# Patient Record
Sex: Female | Born: 1960 | Race: Black or African American | Hispanic: Yes | Marital: Single | Smoking: Never smoker
Health system: Southern US, Community
[De-identification: ages and names within clinical notes are randomized; demographics above are authoritative.]

## PROBLEM LIST (undated history)

## (undated) DIAGNOSIS — Z9289 Personal history of other medical treatment: Secondary | ICD-10-CM

## (undated) DIAGNOSIS — I341 Nonrheumatic mitral (valve) prolapse: Secondary | ICD-10-CM

## (undated) DIAGNOSIS — M329 Systemic lupus erythematosus, unspecified: Secondary | ICD-10-CM

## (undated) DIAGNOSIS — M545 Low back pain, unspecified: Secondary | ICD-10-CM

## (undated) DIAGNOSIS — T8859XA Other complications of anesthesia, initial encounter: Secondary | ICD-10-CM

## (undated) DIAGNOSIS — K5900 Constipation, unspecified: Secondary | ICD-10-CM

## (undated) DIAGNOSIS — E739 Lactose intolerance, unspecified: Secondary | ICD-10-CM

## (undated) DIAGNOSIS — T7840XA Allergy, unspecified, initial encounter: Secondary | ICD-10-CM

## (undated) DIAGNOSIS — K219 Gastro-esophageal reflux disease without esophagitis: Secondary | ICD-10-CM

## (undated) DIAGNOSIS — M1990 Unspecified osteoarthritis, unspecified site: Secondary | ICD-10-CM

## (undated) DIAGNOSIS — E785 Hyperlipidemia, unspecified: Secondary | ICD-10-CM

## (undated) DIAGNOSIS — C50919 Malignant neoplasm of unspecified site of unspecified female breast: Secondary | ICD-10-CM

## (undated) DIAGNOSIS — R569 Unspecified convulsions: Secondary | ICD-10-CM

## (undated) DIAGNOSIS — I10 Essential (primary) hypertension: Secondary | ICD-10-CM

## (undated) DIAGNOSIS — D573 Sickle-cell trait: Secondary | ICD-10-CM

## (undated) DIAGNOSIS — R011 Cardiac murmur, unspecified: Secondary | ICD-10-CM

## (undated) DIAGNOSIS — T4145XA Adverse effect of unspecified anesthetic, initial encounter: Secondary | ICD-10-CM

## (undated) DIAGNOSIS — G8929 Other chronic pain: Secondary | ICD-10-CM

## (undated) DIAGNOSIS — M2550 Pain in unspecified joint: Secondary | ICD-10-CM

## (undated) DIAGNOSIS — R079 Chest pain, unspecified: Secondary | ICD-10-CM

## (undated) DIAGNOSIS — E119 Type 2 diabetes mellitus without complications: Secondary | ICD-10-CM

## (undated) DIAGNOSIS — N289 Disorder of kidney and ureter, unspecified: Secondary | ICD-10-CM

## (undated) HISTORY — DX: Hyperlipidemia, unspecified: E78.5

## (undated) HISTORY — DX: Gastro-esophageal reflux disease without esophagitis: K21.9

## (undated) HISTORY — DX: Essential (primary) hypertension: I10

## (undated) HISTORY — DX: Lactose intolerance, unspecified: E73.9

## (undated) HISTORY — DX: Chest pain, unspecified: R07.9

## (undated) HISTORY — DX: Malignant neoplasm of unspecified site of unspecified female breast: C50.919

## (undated) HISTORY — DX: Pain in unspecified joint: M25.50

## (undated) HISTORY — DX: Disorder of kidney and ureter, unspecified: N28.9

## (undated) HISTORY — PX: COLONOSCOPY: SHX174

## (undated) HISTORY — DX: Systemic lupus erythematosus, unspecified: M32.9

## (undated) HISTORY — PX: POLYPECTOMY: SHX149

## (undated) HISTORY — DX: Allergy, unspecified, initial encounter: T78.40XA

---

## 1988-11-19 DIAGNOSIS — T7840XA Allergy, unspecified, initial encounter: Secondary | ICD-10-CM

## 1988-11-19 HISTORY — DX: Allergy, unspecified, initial encounter: T78.40XA

## 1989-07-20 DIAGNOSIS — Z9289 Personal history of other medical treatment: Secondary | ICD-10-CM

## 1989-07-20 HISTORY — DX: Personal history of other medical treatment: Z92.89

## 1989-11-19 DIAGNOSIS — R569 Unspecified convulsions: Secondary | ICD-10-CM

## 1989-11-19 DIAGNOSIS — K219 Gastro-esophageal reflux disease without esophagitis: Secondary | ICD-10-CM

## 1989-11-19 HISTORY — DX: Gastro-esophageal reflux disease without esophagitis: K21.9

## 1989-11-19 HISTORY — DX: Unspecified convulsions: R56.9

## 1990-11-19 DIAGNOSIS — M329 Systemic lupus erythematosus, unspecified: Secondary | ICD-10-CM

## 1990-11-19 HISTORY — DX: Systemic lupus erythematosus, unspecified: M32.9

## 1991-11-20 DIAGNOSIS — I10 Essential (primary) hypertension: Secondary | ICD-10-CM

## 1991-11-20 HISTORY — DX: Essential (primary) hypertension: I10

## 1995-11-20 HISTORY — PX: ABDOMINAL HYSTERECTOMY: SHX81

## 1998-02-17 ENCOUNTER — Ambulatory Visit (HOSPITAL_COMMUNITY): Admission: RE | Admit: 1998-02-17 | Discharge: 1998-02-17 | Payer: Self-pay | Admitting: General Surgery

## 1999-02-21 ENCOUNTER — Ambulatory Visit (HOSPITAL_COMMUNITY): Admission: RE | Admit: 1999-02-21 | Discharge: 1999-02-21 | Payer: Self-pay | Admitting: General Surgery

## 1999-02-21 ENCOUNTER — Encounter (HOSPITAL_BASED_OUTPATIENT_CLINIC_OR_DEPARTMENT_OTHER): Payer: Self-pay | Admitting: General Surgery

## 1999-06-23 ENCOUNTER — Encounter: Payer: Self-pay | Admitting: Emergency Medicine

## 1999-06-23 ENCOUNTER — Emergency Department (HOSPITAL_COMMUNITY): Admission: EM | Admit: 1999-06-23 | Discharge: 1999-06-23 | Payer: Self-pay | Admitting: Emergency Medicine

## 1999-10-02 ENCOUNTER — Ambulatory Visit (HOSPITAL_COMMUNITY): Admission: RE | Admit: 1999-10-02 | Discharge: 1999-10-02 | Payer: Self-pay | Admitting: Family Medicine

## 1999-10-02 ENCOUNTER — Encounter: Payer: Self-pay | Admitting: Family Medicine

## 2000-03-07 ENCOUNTER — Encounter: Admission: RE | Admit: 2000-03-07 | Discharge: 2000-03-07 | Payer: Self-pay | Admitting: Rheumatology

## 2000-03-07 ENCOUNTER — Encounter: Payer: Self-pay | Admitting: Rheumatology

## 2001-03-12 ENCOUNTER — Encounter: Payer: Self-pay | Admitting: Family Medicine

## 2001-03-12 ENCOUNTER — Encounter: Admission: RE | Admit: 2001-03-12 | Discharge: 2001-03-12 | Payer: Self-pay | Admitting: Family Medicine

## 2001-03-19 ENCOUNTER — Encounter: Payer: Self-pay | Admitting: Family Medicine

## 2001-03-19 ENCOUNTER — Encounter: Admission: RE | Admit: 2001-03-19 | Discharge: 2001-03-19 | Payer: Self-pay | Admitting: Internal Medicine

## 2001-05-17 ENCOUNTER — Encounter: Admission: RE | Admit: 2001-05-17 | Discharge: 2001-05-17 | Payer: Self-pay | Admitting: Family Medicine

## 2001-05-17 ENCOUNTER — Encounter: Payer: Self-pay | Admitting: Family Medicine

## 2001-07-23 ENCOUNTER — Encounter: Payer: Self-pay | Admitting: Urology

## 2001-07-23 ENCOUNTER — Encounter: Admission: RE | Admit: 2001-07-23 | Discharge: 2001-07-23 | Payer: Self-pay | Admitting: Urology

## 2001-08-01 ENCOUNTER — Encounter: Payer: Self-pay | Admitting: Urology

## 2001-08-04 ENCOUNTER — Ambulatory Visit (HOSPITAL_COMMUNITY): Admission: RE | Admit: 2001-08-04 | Discharge: 2001-08-04 | Payer: Self-pay | Admitting: Urology

## 2001-08-04 ENCOUNTER — Encounter (INDEPENDENT_AMBULATORY_CARE_PROVIDER_SITE_OTHER): Payer: Self-pay | Admitting: Specialist

## 2001-11-03 ENCOUNTER — Ambulatory Visit (HOSPITAL_COMMUNITY): Admission: RE | Admit: 2001-11-03 | Discharge: 2001-11-03 | Payer: Self-pay | Admitting: Urology

## 2001-11-03 ENCOUNTER — Encounter (INDEPENDENT_AMBULATORY_CARE_PROVIDER_SITE_OTHER): Payer: Self-pay

## 2001-11-04 ENCOUNTER — Emergency Department (HOSPITAL_COMMUNITY): Admission: EM | Admit: 2001-11-04 | Discharge: 2001-11-04 | Payer: Self-pay | Admitting: Emergency Medicine

## 2002-05-21 ENCOUNTER — Encounter: Admission: RE | Admit: 2002-05-21 | Discharge: 2002-05-21 | Payer: Self-pay | Admitting: Otolaryngology

## 2002-05-21 ENCOUNTER — Encounter: Payer: Self-pay | Admitting: Otolaryngology

## 2002-08-19 ENCOUNTER — Encounter: Payer: Self-pay | Admitting: Urology

## 2002-08-19 ENCOUNTER — Encounter: Admission: RE | Admit: 2002-08-19 | Discharge: 2002-08-19 | Payer: Self-pay | Admitting: Urology

## 2002-08-27 ENCOUNTER — Ambulatory Visit (HOSPITAL_COMMUNITY): Admission: RE | Admit: 2002-08-27 | Discharge: 2002-08-27 | Payer: Self-pay | Admitting: Obstetrics

## 2002-08-27 ENCOUNTER — Encounter: Payer: Self-pay | Admitting: Obstetrics

## 2003-09-14 ENCOUNTER — Ambulatory Visit (HOSPITAL_COMMUNITY): Admission: RE | Admit: 2003-09-14 | Discharge: 2003-09-14 | Payer: Self-pay | Admitting: Family Medicine

## 2004-10-31 ENCOUNTER — Encounter: Admission: RE | Admit: 2004-10-31 | Discharge: 2004-10-31 | Payer: Self-pay | Admitting: Family Medicine

## 2004-12-06 ENCOUNTER — Ambulatory Visit (HOSPITAL_COMMUNITY): Admission: RE | Admit: 2004-12-06 | Discharge: 2004-12-06 | Payer: Self-pay | Admitting: Family Medicine

## 2006-02-25 ENCOUNTER — Ambulatory Visit (HOSPITAL_COMMUNITY): Admission: RE | Admit: 2006-02-25 | Discharge: 2006-02-25 | Payer: Self-pay | Admitting: Family Medicine

## 2006-11-19 DIAGNOSIS — N289 Disorder of kidney and ureter, unspecified: Secondary | ICD-10-CM

## 2006-11-19 HISTORY — PX: CARPAL TUNNEL RELEASE: SHX101

## 2006-11-19 HISTORY — PX: WRIST FRACTURE SURGERY: SHX121

## 2006-11-19 HISTORY — DX: Disorder of kidney and ureter, unspecified: N28.9

## 2007-01-06 ENCOUNTER — Encounter: Admission: RE | Admit: 2007-01-06 | Discharge: 2007-01-06 | Payer: Self-pay | Admitting: Family Medicine

## 2007-01-10 ENCOUNTER — Encounter: Admission: RE | Admit: 2007-01-10 | Discharge: 2007-01-10 | Payer: Self-pay | Admitting: Family Medicine

## 2007-04-01 ENCOUNTER — Ambulatory Visit (HOSPITAL_COMMUNITY): Admission: RE | Admit: 2007-04-01 | Discharge: 2007-04-01 | Payer: Self-pay | Admitting: Family Medicine

## 2007-12-15 ENCOUNTER — Encounter: Admission: RE | Admit: 2007-12-15 | Discharge: 2007-12-15 | Payer: Self-pay | Admitting: Family Medicine

## 2008-07-08 ENCOUNTER — Ambulatory Visit (HOSPITAL_COMMUNITY): Admission: RE | Admit: 2008-07-08 | Discharge: 2008-07-08 | Payer: Self-pay | Admitting: Family Medicine

## 2008-07-15 ENCOUNTER — Encounter: Admission: RE | Admit: 2008-07-15 | Discharge: 2008-07-15 | Payer: Self-pay | Admitting: Family Medicine

## 2009-08-09 ENCOUNTER — Ambulatory Visit (HOSPITAL_COMMUNITY): Admission: RE | Admit: 2009-08-09 | Discharge: 2009-08-09 | Payer: Self-pay | Admitting: Family Medicine

## 2009-11-19 DIAGNOSIS — M1990 Unspecified osteoarthritis, unspecified site: Secondary | ICD-10-CM

## 2009-11-19 DIAGNOSIS — M2550 Pain in unspecified joint: Secondary | ICD-10-CM

## 2009-11-19 HISTORY — DX: Unspecified osteoarthritis, unspecified site: M19.90

## 2009-11-19 HISTORY — DX: Pain in unspecified joint: M25.50

## 2010-04-03 DIAGNOSIS — I10 Essential (primary) hypertension: Secondary | ICD-10-CM

## 2010-04-03 DIAGNOSIS — M329 Systemic lupus erythematosus, unspecified: Secondary | ICD-10-CM

## 2010-04-03 HISTORY — DX: Essential (primary) hypertension: I10

## 2010-04-03 HISTORY — DX: Systemic lupus erythematosus, unspecified: M32.9

## 2010-10-03 ENCOUNTER — Encounter: Admission: RE | Admit: 2010-10-03 | Discharge: 2010-10-03 | Payer: Self-pay | Admitting: Family Medicine

## 2011-02-01 ENCOUNTER — Other Ambulatory Visit (HOSPITAL_COMMUNITY): Payer: Self-pay | Admitting: Family Medicine

## 2011-02-01 DIAGNOSIS — Z1231 Encounter for screening mammogram for malignant neoplasm of breast: Secondary | ICD-10-CM

## 2011-02-13 ENCOUNTER — Ambulatory Visit (HOSPITAL_COMMUNITY)
Admission: RE | Admit: 2011-02-13 | Discharge: 2011-02-13 | Disposition: A | Discharge status: Home / Self Care | Payer: Managed Care, Other (non HMO) | Source: Ambulatory Visit | Attending: Family Medicine | Admitting: Family Medicine

## 2011-02-13 DIAGNOSIS — Z1231 Encounter for screening mammogram for malignant neoplasm of breast: Secondary | ICD-10-CM | POA: Insufficient documentation

## 2011-04-06 NOTE — Op Note (Signed)
[facility]  Patient:    Vickie, Lane Visit Number: [id number] MRN: [id number]          Service Type: DSU Location: DAY Attending Physician:  Lindaann Slough Proc. Date: 08/04/01 Admit Date:  08/04/2001   CC:         Kathreen Cosier, M.D.   Operative Report  PREOPERATIVE DIAGNOSES:  Gross hematuria, filling defects collecting system left kidney.  POSTOPERATIVE DIAGNOSES:  Gross hematuria, filling defects collecting system left kidney.  PROCEDURE DONE:  Cystoscopy, left retrograde pyelogram, ureteroscopy, brush biopsy, and biopsy of left upper calix mass.  SURGEON:  Lindaann Slough, M.D.  ANESTHESIA:  General.  INDICATION:  The patient is a 50 year old female, who has been having gross hematuria on and off for the past five months.  An IVP showed a filling defect in the upper pole calix of the left kidney.  She is scheduled today for cystoscopy, ureteroscopy, and biopsy of the filling defect.  DESCRIPTION OF PROCEDURE:  Under general anesthesia, the patient was prepped and draped and placed in the dorsal lithotomy position.  A #22 Wappler cystoscope was inserted in the bladder.  The bladder mucosa is normal.  There is no stone or tumor in the bladder.  The ureteral orifices are in normal position and shape with clear efflux.  The bladder was then irrigated with normal saline, and bladder washings were sent for cytology.  A cone tip catheter was passed through the cystoscope into the left ureteral orifice. Contrast was then injected through the cone tip catheter.  There is a filling defect in the upper pole calix of the left kidney.  The cone tip catheter was then removed.  A guidewire was passed through the cystoscope into the left ureter.  The cystoscope was removed.  A ureteroscope access sheath was then passed over the guidewire into the upper ureter.  Then a flexible ureteroscope was passed through the access sheath and  advanced into the upper ureter and to the upper pole calix of the kidney.  There is a sessile mass protruding into the lumen of the upper pole calix.  A similar mass was also visualized in a middle calix of the kidney.  A brush biopsy forceps was then passed through the ureteroscope, and brush biopsy of the upper pole calix mass was done. Several attempts were made to biopsy the mass with the cold cup forceps, but significant amount of tissue could be obtained.  Another brush biopsy of the mass was also done.  The ureteroscope was then removed.  The renal pelvis was irrigated with normal saline, and washings were sent for cytology.  Therapeutic ureteroscope access sheath was then removed.  The bladder was emptied and the cystoscope removed.  The patient tolerated the procedure well and left the OR in satisfactory condition to postanesthesia care unit. Attending Physician:  Lindaann Slough DD:  08/04/01 TD:  08/04/01 Job: [id number]

## 2011-04-06 NOTE — Op Note (Signed)
[facility]  Patient:    Vickie Lane, Vickie Lane Visit Number: [id number] MRN: [id number]          Service Type: DSU Location: DAY Attending Physician:  Lindaann Slough Dictated by:   Lindaann Slough, M.D. Proc. Date: 11/03/01 Admit Date:  11/03/2001   CC:         Kathreen Cosier, M.D.   Operative Report  PREOPERATIVE DIAGNOSES: 1. Gross hematuria. 2. Filling defect, left renal pelvis.  POSTOPERATIVE DIAGNOSES: 1. Gross hematuria. 2. Filling defect, left renal pelvis.  PROCEDURE: 1. Cystoscopy. 2. Left retrograde pyelogram. 3. Ureteroscopy with biopsy filling defects left renal pelvis. 4. Insertion of Double J catheter.  SURGEON:  Lindaann Slough, M.D.  ANESTHESIA:  General.  INDICATION:  The patient is a 50 year old female who had gross, painless hematuria about four months ago. An IVP showed a filling defect, left renal pelvis. Brush biopsy of filling defects in pathology showed atypical cells. The patient has had on and off left flank pain but no more gross hematuria. She is scheduled today for repeat biopsy of the filling defects of the renal pelvis.  DESCRIPTION OF PROCEDURE:  Under general anesthesia, the patient was prepped and draped and placed in the dorsal lithotomy position. A #22 Wappler was inserted in the bladder. The bladder mucous is normal. There is no stone or tumor in the bladder. The ureteral orifices are in normal position and shape with clear efflux. The bladder was then irrigated with normal saline and bladder washings were sent for cytology. Then a cone-tip catheter was passed through the cystoscope into the left ureteral orifice. Contrast was then injected through the cone-tip catheter. The ureter appears normal. There is no hydronephrosis or filling defect in the ureter. There is a filling defect in the upper pole calyx of the kidney. The cone-tip catheter was then removed. A guidewire was then passed through  the cystoscope into the left ureter. A ureteroscope access sheath was then passed over the guidewire and advanced up to the upper ureter. A flexible ureteroscope was then passed through the access sheath and advanced all the way up into the upper ureter. 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 A brush biopsy forceps was passed through the ureteroscope and a brush biopsy of the filling defect of the upper pole lesion was done. Then a biopsy forceps was passed on through the ureteroscope and several biopsies of the lesions were done.  The renal pelvis was then irrigated with normal saline and washings of the renal pelvis were sent for cytology. Then the guidewire was passed on through the ureteroscope access sheath and the access sheath was removed. The guidewire was then backloaded into the cystoscope and a #6 French-26 Double J catheter was passed over the guidewire. The proximal boil of the Double J catheter is in the collecting system; the distal end is in the bladder.  The patient tolerated the procedure well and left the OR in satisfactory condition to the postanesthesia care unit. Dictated by:   Lindaann Slough, M.D. Attending Physician:  Lindaann Slough DD:  11/03/01 TD:  11/03/01 Job: [id number]

## 2011-08-20 DIAGNOSIS — E119 Type 2 diabetes mellitus without complications: Secondary | ICD-10-CM

## 2011-08-20 HISTORY — DX: Type 2 diabetes mellitus without complications: E11.9

## 2011-08-27 LAB — CYTOLOGY - PAP: Pap: NEGATIVE

## 2011-09-17 ENCOUNTER — Encounter: Payer: 59 | Attending: Family Medicine | Admitting: *Deleted

## 2011-09-18 ENCOUNTER — Encounter: Payer: Self-pay | Admitting: *Deleted

## 2011-09-18 NOTE — Progress Notes (Signed)
  Patient was seen on 09/17/11 for the first of a series of three diabetes self-management courses at the [facility]. The following learning objectives were met by the patient during this course:   Defines diabetes and the role of insulin  Identifies type of diabetes and pathophysiology  States normal BG range and personal goals  Identifies three risk factors for the development of diabetes  States the need for and frequency of healthcare follow up (ADA Standards of Care)  Last A1c per Medlink: 6.6% (08/21/11)  Handouts given during class include:  [facility] Core Class 1 Handout  ADA: What's My Number (A1c handout)  [facility] ADA Standards of Care Handout  [facility] Medication Log handout  Patient has established the following initial goals:  Prioritize needs  Lose weight  Follow-Up Plan: Pt to attend Core Diabetes Education Classes II and III in December 2012.

## 2011-09-18 NOTE — Patient Instructions (Signed)
Pt to attend Core Diabetes Education Classes II and III in December 2012.

## 2011-10-30 ENCOUNTER — Encounter: Payer: 59 | Attending: Family Medicine

## 2011-11-05 NOTE — Progress Notes (Signed)
  Patient was seen on 10/30/2011 for the second of a series of three diabetes self-management courses at the [facility]. The following learning objectives were met by the patient during this course:   States the relationship of exercise to blood glucose  States benefits/barriers of regular and safe exercise  States three guidelines for safe and effective exercise  Describes personal diabetes medicine regimen  Describes actions of own medications  Describes causes, symptoms, and treatment of hypo/hyperglycemia  Describes sick day rules  Identifies when to test urine for ketones when appropriate  Identifies when to call healthcare provider for acute complications  States the risk for problems with foot, skin, and dental care  States preventative foot, skin, and dental care measures  States when to call healthcare provider regarding foot, skin, and dental care  Identifies methods for evaluation of diabetes control  Discusses benefits of SBGM  Identifies relationship between nutrition, exercise, medication, and glucose levels  Discusses the importance of record keeping  Patient received [facility] Core Program Notebook at class.  Follow-Up Plan: Patient will attend the final class of the ADA Diabetes Self-Care Education.    

## 2011-11-06 ENCOUNTER — Encounter: Payer: 59 | Admitting: Dietician

## 2011-11-07 NOTE — Progress Notes (Signed)
  Patient was seen on 11/07/2011 for the third of a series of three diabetes self-management courses at the [facility]. The following learning objectives were met by the patient during this course:   Identifies nutrient effects on glycemia  States the general guidelines of meal planning  Relates understanding of personal meal plan  Describes situations that cause stress and discuss methods of stress management  Identifies lifestyle behaviors for change  The following handouts were given in class:  [employer] Carbohydrate Counting book  3 Month Follow Up Visit handout  Goal setting handout  Class evaluation form  Your patient has established the following 3 month goals for diabetes self-care:  Eat meals on time  Check my blood sugar 3 times per week  Walk or exercise 60 minutes 4 times per week.  Check weight, blood pressure and feet every visit to doctor or educator  Follow-Up Plan: Patient will attend a 3 month follow-up visit for diabetes self-management education.

## 2012-01-24 ENCOUNTER — Ambulatory Visit: Payer: 59 | Admitting: Dietician

## 2012-02-08 ENCOUNTER — Encounter: Payer: Self-pay | Admitting: Dietician

## 2012-02-08 ENCOUNTER — Encounter: Payer: 59 | Attending: Family Medicine | Admitting: Dietician

## 2012-02-08 NOTE — Progress Notes (Signed)
  Patient was seen on 02/08/2012 for their 3 month follow-up as a part of the diabetes self-management courses at the [facility]. The following learning objectives were met by your patient during this course:  Patient self reports the following:  Diabetes control has improved since diabetes self-management training: yes Number of days blood glucose is >200: none Last MD appointment for diabetes: January Changes in treatment plan: None Confidence with ability to manage diabetes: Improved  Areas for improvement with diabetes self-care: Try to get appropriate amount of protein and continue to loose weight. Willingness to participate in diabetes support group: Limited participation R/T busy schedule and exercise regimen A1C: 08/21/11= 6.8%  11/2011= 7.7% Weight: 09/17/11= 203.3 lb   02/08/2012= 182.1 lb  (BMI:28.6)  With the weight loss and the changes in carbohydrate intake and the regular exercise.  One would anticipate a decrease in the HgA1C.  She does report having a Sickle Cell Trait.  I'm wondering if this might influence her A1C readings. A1C can have variable readings in patients with Sickle Cell.  For these folks, a Fructosamine test is often used.  This would evaluate the glucose level over a 2-3 week period as opposed the A1C 12 week period.  For her this might be a more reliable test for blood glucose average.   Please see Diabetes Flow sheet for findings related to patient's self-care.  Follow-Up Plan: Patient is eligible for a "free" 30 minute diabetes self-care appointment in the next year. Patient to call and schedule as needed.

## 2012-07-04 ENCOUNTER — Encounter (INDEPENDENT_AMBULATORY_CARE_PROVIDER_SITE_OTHER): Payer: 59 | Admitting: Ophthalmology

## 2012-08-13 ENCOUNTER — Other Ambulatory Visit: Payer: Self-pay | Admitting: Neurosurgery

## 2012-08-13 DIAGNOSIS — M5126 Other intervertebral disc displacement, lumbar region: Secondary | ICD-10-CM

## 2012-08-19 ENCOUNTER — Ambulatory Visit (HOSPITAL_COMMUNITY)
Admission: RE | Admit: 2012-08-19 | Discharge: 2012-08-19 | Disposition: A | Discharge status: Home / Self Care | Payer: 59 | Source: Ambulatory Visit | Attending: Neurosurgery | Admitting: Neurosurgery

## 2012-08-19 DIAGNOSIS — M51379 Other intervertebral disc degeneration, lumbosacral region without mention of lumbar back pain or lower extremity pain: Secondary | ICD-10-CM | POA: Insufficient documentation

## 2012-08-19 DIAGNOSIS — M5126 Other intervertebral disc displacement, lumbar region: Secondary | ICD-10-CM

## 2012-08-19 DIAGNOSIS — M5137 Other intervertebral disc degeneration, lumbosacral region: Secondary | ICD-10-CM | POA: Insufficient documentation

## 2012-10-20 ENCOUNTER — Other Ambulatory Visit: Payer: Self-pay | Admitting: Neurosurgery

## 2012-10-23 ENCOUNTER — Other Ambulatory Visit (HOSPITAL_COMMUNITY): Payer: Self-pay | Admitting: Family Medicine

## 2012-10-23 DIAGNOSIS — Z1231 Encounter for screening mammogram for malignant neoplasm of breast: Secondary | ICD-10-CM

## 2012-10-31 ENCOUNTER — Other Ambulatory Visit (HOSPITAL_COMMUNITY): Payer: 59

## 2012-10-31 ENCOUNTER — Encounter (HOSPITAL_COMMUNITY): Payer: Self-pay

## 2012-10-31 ENCOUNTER — Ambulatory Visit (HOSPITAL_COMMUNITY)
Admission: RE | Admit: 2012-10-31 | Discharge: 2012-10-31 | Disposition: A | Discharge status: Home / Self Care | Payer: 59 | Source: Ambulatory Visit | Attending: Neurosurgery | Admitting: Neurosurgery

## 2012-10-31 ENCOUNTER — Encounter (HOSPITAL_COMMUNITY)
Admission: RE | Admit: 2012-10-31 | Discharge: 2012-10-31 | Disposition: A | Discharge status: Home / Self Care | Payer: 59 | Source: Ambulatory Visit | Attending: Neurosurgery | Admitting: Neurosurgery

## 2012-10-31 DIAGNOSIS — Z01818 Encounter for other preprocedural examination: Secondary | ICD-10-CM | POA: Insufficient documentation

## 2012-10-31 HISTORY — DX: Sickle-cell trait: D57.3

## 2012-10-31 HISTORY — DX: Cardiac murmur, unspecified: R01.1

## 2012-10-31 HISTORY — DX: Constipation, unspecified: K59.00

## 2012-10-31 HISTORY — DX: Adverse effect of unspecified anesthetic, initial encounter: T41.45XA

## 2012-10-31 HISTORY — DX: Unspecified osteoarthritis, unspecified site: M19.90

## 2012-10-31 HISTORY — DX: Personal history of other medical treatment: Z92.89

## 2012-10-31 HISTORY — DX: Nonrheumatic mitral (valve) prolapse: I34.1

## 2012-10-31 HISTORY — DX: Other complications of anesthesia, initial encounter: T88.59XA

## 2012-10-31 LAB — BASIC METABOLIC PANEL
Chloride: 102 mEq/L (ref 96–112)
GFR calc Af Amer: 79 mL/min — ABNORMAL LOW (ref >90)
GFR calc non Af Amer: 68 mL/min — ABNORMAL LOW (ref >90)
Potassium: 3.2 mEq/L — ABNORMAL LOW (ref 3.5–5.1)
Sodium: 140 mEq/L (ref 135–145)

## 2012-10-31 LAB — TYPE AND SCREEN
ABO/RH(D): A POS
Antibody Screen: NEGATIVE

## 2012-10-31 LAB — CBC
HCT: 39.3 % (ref 36.0–46.0)
Hemoglobin: 13.3 g/dL (ref 12.0–15.0)
RBC: 4.8 MIL/uL (ref 3.87–5.11)
WBC: 5.4 10*3/uL (ref 4.0–10.5)

## 2012-10-31 LAB — ABO/RH: ABO/RH(D): A POS

## 2012-10-31 LAB — SURGICAL PCR SCREEN: MRSA, PCR: NEGATIVE

## 2012-10-31 NOTE — Progress Notes (Signed)
Pt does not see a cardiologist and has not had an EKG in the past year.  Pt has had one Echo, many years ago at [facility].  Never had a stress test.  Pt denies chest pain.  EKG preliminary results show "ST and T wave abnormality, consider inferior ischemia.  I left chart for Revonda Standard to review.

## 2012-10-31 NOTE — Pre-Procedure Instructions (Addendum)
[address]  10/31/2012   Your procedure is scheduled on: Friday, December 20th.  Report to [facility] at 5:30 AM.  Call this number if you have problems the morning of surgery: [phone]   Remember: Nothing to eat or drink after Midnight. .  Take these medicines the morning of surgery with A SIP OF WATER: Amlodipine (Norvasc) and Clonidine.    Stop taking Diclofenac (Voltaren) today.    Do not wear jewelry, make-up or nail polish.  Do not wear lotions, powders, or perfumes. You may wear deodorant.  Do not shave 48 hours prior to surgery. Men may shave face and neck.  Do not bring valuables to the hospital.  Contacts, dentures or bridgework may not be worn into surgery.  Leave suitcase in the car. After surgery it may be brought to your room.  For patients admitted to the hospital, checkout time is 11:00 AM the day of discharge.   Patients discharged the day of surgery will not be allowed to drive home.  Name and phone number of your driver: NA  Special Instructions: Shower using CHG 2 nights before surgery and the night before surgery.  If you shower the day of surgery use CHG.  Use special wash - you have one bottle of CHG for all showers.  You should use approximately 1/3 of the bottle for each shower.   Please read over the following fact sheets that you were given: Pain Booklet, Coughing and Deep Breathing, Blood Transfusion Information and Surgical Site Infection Prevention

## 2012-11-03 ENCOUNTER — Encounter (HOSPITAL_COMMUNITY): Payer: Self-pay | Admitting: Pharmacy Technician

## 2012-11-06 MED ORDER — CEFAZOLIN SODIUM-DEXTROSE 2-3 GM-% IV SOLR
2.0000 g | INTRAVENOUS | Status: AC
  Administered 2012-11-07: 2 g via INTRAVENOUS
  Filled 2012-11-06: qty 50

## 2012-11-07 ENCOUNTER — Encounter (HOSPITAL_COMMUNITY): Payer: Self-pay | Admitting: General Practice

## 2012-11-07 ENCOUNTER — Encounter (HOSPITAL_COMMUNITY): Payer: Self-pay | Admitting: *Deleted

## 2012-11-07 ENCOUNTER — Encounter (HOSPITAL_COMMUNITY): Payer: Self-pay | Admitting: Anesthesiology

## 2012-11-07 ENCOUNTER — Ambulatory Visit (HOSPITAL_COMMUNITY): Payer: 59 | Admitting: Anesthesiology

## 2012-11-07 ENCOUNTER — Encounter (HOSPITAL_COMMUNITY)
Admission: RE | Disposition: A | Discharge status: Home / Self Care | Payer: Self-pay | Source: Ambulatory Visit | Attending: Neurosurgery

## 2012-11-07 ENCOUNTER — Ambulatory Visit (HOSPITAL_COMMUNITY): Payer: 59

## 2012-11-07 ENCOUNTER — Inpatient Hospital Stay (HOSPITAL_COMMUNITY)
Admission: RE | Admit: 2012-11-07 | Discharge: 2012-11-10 | DRG: 460 | Disposition: A | Discharge status: Home / Self Care | Payer: 59 | Source: Ambulatory Visit | Attending: Neurosurgery | Admitting: Neurosurgery

## 2012-11-07 DIAGNOSIS — K219 Gastro-esophageal reflux disease without esophagitis: Secondary | ICD-10-CM | POA: Diagnosis present

## 2012-11-07 DIAGNOSIS — E785 Hyperlipidemia, unspecified: Secondary | ICD-10-CM | POA: Diagnosis present

## 2012-11-07 DIAGNOSIS — E119 Type 2 diabetes mellitus without complications: Secondary | ICD-10-CM | POA: Diagnosis present

## 2012-11-07 DIAGNOSIS — Z7982 Long term (current) use of aspirin: Secondary | ICD-10-CM

## 2012-11-07 DIAGNOSIS — M5126 Other intervertebral disc displacement, lumbar region: Secondary | ICD-10-CM | POA: Diagnosis present

## 2012-11-07 DIAGNOSIS — Z79899 Other long term (current) drug therapy: Secondary | ICD-10-CM

## 2012-11-07 DIAGNOSIS — Q762 Congenital spondylolisthesis: Secondary | ICD-10-CM

## 2012-11-07 DIAGNOSIS — I10 Essential (primary) hypertension: Secondary | ICD-10-CM | POA: Diagnosis present

## 2012-11-07 DIAGNOSIS — I059 Rheumatic mitral valve disease, unspecified: Secondary | ICD-10-CM | POA: Diagnosis present

## 2012-11-07 DIAGNOSIS — D573 Sickle-cell trait: Secondary | ICD-10-CM | POA: Diagnosis present

## 2012-11-07 DIAGNOSIS — M47817 Spondylosis without myelopathy or radiculopathy, lumbosacral region: Principal | ICD-10-CM | POA: Diagnosis present

## 2012-11-07 HISTORY — DX: Low back pain, unspecified: M54.50

## 2012-11-07 HISTORY — PX: POSTERIOR LUMBAR FUSION: SHX6036

## 2012-11-07 HISTORY — DX: Low back pain: M54.5

## 2012-11-07 HISTORY — DX: Unspecified convulsions: R56.9

## 2012-11-07 HISTORY — DX: Type 2 diabetes mellitus without complications: E11.9

## 2012-11-07 HISTORY — DX: Other chronic pain: G89.29

## 2012-11-07 LAB — GLUCOSE, CAPILLARY: Glucose-Capillary: 102 mg/dL — ABNORMAL HIGH (ref 70–99)

## 2012-11-07 SURGERY — POSTERIOR LUMBAR FUSION 1 LEVEL
Anesthesia: General | Wound class: Clean

## 2012-11-07 MED ORDER — MORPHINE SULFATE (PF) 1 MG/ML IV SOLN
INTRAVENOUS | Status: DC
  Administered 2012-11-07: 17:00:00 via INTRAVENOUS
  Administered 2012-11-07 – 2012-11-08 (×4): 3 mg via INTRAVENOUS
  Administered 2012-11-08: 4.5 mg via INTRAVENOUS
  Administered 2012-11-08: 3 mg via INTRAVENOUS
  Administered 2012-11-08: 2.66 mg via INTRAVENOUS
  Administered 2012-11-08: 25 mg via INTRAVENOUS
  Administered 2012-11-09: 7.5 mg via INTRAVENOUS
  Administered 2012-11-09: 9 mg via INTRAVENOUS
  Administered 2012-11-09: 4.5 mg via INTRAVENOUS
  Filled 2012-11-07 (×2): qty 25

## 2012-11-07 MED ORDER — OXYCODONE HCL 5 MG PO TABS: 5.0000 mg | ORAL_TABLET | Freq: Once | ORAL | Status: DC | PRN

## 2012-11-07 MED ORDER — HYDROCHLOROTHIAZIDE 25 MG PO TABS
25.0000 mg | ORAL_TABLET | Freq: Every day | ORAL | Status: DC
  Administered 2012-11-08 – 2012-11-10 (×3): 25 mg via ORAL
  Filled 2012-11-07 (×4): qty 1

## 2012-11-07 MED ORDER — NALOXONE HCL 0.4 MG/ML IJ SOLN: 0.4000 mg | INTRAMUSCULAR | Status: DC | PRN

## 2012-11-07 MED ORDER — POTASSIUM CHLORIDE IN NACL 20-0.9 MEQ/L-% IV SOLN
INTRAVENOUS | Status: DC
  Administered 2012-11-07 – 2012-11-08 (×2): via INTRAVENOUS
  Filled 2012-11-07 (×7): qty 1000

## 2012-11-07 MED ORDER — LACTATED RINGERS IV SOLN
INTRAVENOUS | Status: DC | PRN
  Administered 2012-11-07 (×3): via INTRAVENOUS

## 2012-11-07 MED ORDER — ONDANSETRON HCL 4 MG/2ML IJ SOLN
4.0000 mg | INTRAMUSCULAR | Status: DC | PRN
  Administered 2012-11-07 – 2012-11-10 (×9): 4 mg via INTRAVENOUS
  Filled 2012-11-07 (×7): qty 2

## 2012-11-07 MED ORDER — BISACODYL 5 MG PO TBEC
5.0000 mg | DELAYED_RELEASE_TABLET | Freq: Every day | ORAL | Status: DC | PRN
  Administered 2012-11-10: 5 mg via ORAL
  Filled 2012-11-07: qty 1

## 2012-11-07 MED ORDER — CLONIDINE HCL 0.1 MG PO TABS
0.1000 mg | ORAL_TABLET | Freq: Two times a day (BID) | ORAL | Status: DC
  Administered 2012-11-07 – 2012-11-10 (×6): 0.1 mg via ORAL
  Filled 2012-11-07 (×9): qty 1

## 2012-11-07 MED ORDER — DIPHENHYDRAMINE HCL 12.5 MG/5ML PO ELIX: 12.5000 mg | ORAL_SOLUTION | Freq: Four times a day (QID) | ORAL | Status: DC | PRN

## 2012-11-07 MED ORDER — ROCURONIUM BROMIDE 100 MG/10ML IV SOLN
INTRAVENOUS | Status: DC | PRN
  Administered 2012-11-07: 50 mg via INTRAVENOUS
  Administered 2012-11-07: 20 mg via INTRAVENOUS
  Administered 2012-11-07: 10 mg via INTRAVENOUS

## 2012-11-07 MED ORDER — SODIUM CHLORIDE 0.9 % IJ SOLN
3.0000 mL | Freq: Two times a day (BID) | INTRAMUSCULAR | Status: DC
  Administered 2012-11-07 – 2012-11-10 (×2): 3 mL via INTRAVENOUS

## 2012-11-07 MED ORDER — ESTRADIOL 0.1 MG/24HR TD PTWK
0.1000 mg | MEDICATED_PATCH | TRANSDERMAL | Status: DC
  Administered 2012-11-07: 0.1 mg via TRANSDERMAL
  Filled 2012-11-07: qty 1

## 2012-11-07 MED ORDER — ONDANSETRON HCL 4 MG/2ML IJ SOLN
4.0000 mg | Freq: Four times a day (QID) | INTRAMUSCULAR | Status: DC | PRN
  Filled 2012-11-07: qty 2

## 2012-11-07 MED ORDER — HYDROMORPHONE HCL PF 1 MG/ML IJ SOLN
0.2500 mg | INTRAMUSCULAR | Status: DC | PRN
  Administered 2012-11-07 (×2): 0.5 mg via INTRAVENOUS

## 2012-11-07 MED ORDER — ACETAMINOPHEN 10 MG/ML IV SOLN
INTRAVENOUS | Status: AC
  Administered 2012-11-07: 1000 mg via INTRAVENOUS
  Filled 2012-11-07: qty 100

## 2012-11-07 MED ORDER — SODIUM CHLORIDE 0.9 % IJ SOLN: 9.0000 mL | INTRAMUSCULAR | Status: DC | PRN

## 2012-11-07 MED ORDER — ONDANSETRON HCL 4 MG/2ML IJ SOLN
INTRAMUSCULAR | Status: DC | PRN
  Administered 2012-11-07: 4 mg via INTRAVENOUS

## 2012-11-07 MED ORDER — PROPOFOL 10 MG/ML IV BOLUS
INTRAVENOUS | Status: DC | PRN
  Administered 2012-11-07: 180 mg via INTRAVENOUS

## 2012-11-07 MED ORDER — LIDOCAINE HCL (CARDIAC) 20 MG/ML IV SOLN
INTRAVENOUS | Status: DC | PRN
  Administered 2012-11-07: 100 mg via INTRAVENOUS

## 2012-11-07 MED ORDER — DIPHENHYDRAMINE HCL 50 MG/ML IJ SOLN: 12.5000 mg | Freq: Four times a day (QID) | INTRAMUSCULAR | Status: DC | PRN

## 2012-11-07 MED ORDER — 0.9 % SODIUM CHLORIDE (POUR BTL) OPTIME
TOPICAL | Status: DC | PRN
  Administered 2012-11-07: 1000 mL

## 2012-11-07 MED ORDER — DIAZEPAM 5 MG PO TABS
5.0000 mg | ORAL_TABLET | Freq: Four times a day (QID) | ORAL | Status: DC | PRN
  Administered 2012-11-08 – 2012-11-09 (×2): 5 mg via ORAL
  Filled 2012-11-07 (×2): qty 1

## 2012-11-07 MED ORDER — ACETAMINOPHEN 650 MG RE SUPP: 650.0000 mg | RECTAL | Status: DC | PRN

## 2012-11-07 MED ORDER — ASPIRIN EC 81 MG PO TBEC
81.0000 mg | DELAYED_RELEASE_TABLET | Freq: Every morning | ORAL | Status: DC
  Administered 2012-11-08 – 2012-11-10 (×3): 81 mg via ORAL
  Filled 2012-11-07 (×3): qty 1

## 2012-11-07 MED ORDER — SODIUM CHLORIDE 0.9 % IJ SOLN: 3.0000 mL | INTRAMUSCULAR | Status: DC | PRN

## 2012-11-07 MED ORDER — NEOSTIGMINE METHYLSULFATE 1 MG/ML IJ SOLN
INTRAMUSCULAR | Status: DC | PRN
  Administered 2012-11-07: 3 mg via INTRAVENOUS

## 2012-11-07 MED ORDER — SODIUM CHLORIDE 0.9 % IV SOLN
250.0000 mL | INTRAVENOUS | Status: DC
  Administered 2012-11-07: 250 mL via INTRAVENOUS

## 2012-11-07 MED ORDER — BENAZEPRIL HCL 20 MG PO TABS
20.0000 mg | ORAL_TABLET | Freq: Every day | ORAL | Status: DC
  Administered 2012-11-08 – 2012-11-10 (×3): 20 mg via ORAL
  Filled 2012-11-07 (×4): qty 1

## 2012-11-07 MED ORDER — MIDAZOLAM HCL 5 MG/5ML IJ SOLN
INTRAMUSCULAR | Status: DC | PRN
  Administered 2012-11-07: 2 mg via INTRAVENOUS

## 2012-11-07 MED ORDER — OXYCODONE HCL 5 MG/5ML PO SOLN: 5.0000 mg | Freq: Once | ORAL | Status: DC | PRN

## 2012-11-07 MED ORDER — PHENOL 1.4 % MT LIQD: 1.0000 | OROMUCOSAL | Status: DC | PRN

## 2012-11-07 MED ORDER — AMLODIPINE BESYLATE 10 MG PO TABS
10.0000 mg | ORAL_TABLET | Freq: Every day | ORAL | Status: DC
  Administered 2012-11-07 – 2012-11-09 (×3): 10 mg via ORAL
  Filled 2012-11-07 (×4): qty 1

## 2012-11-07 MED ORDER — HYDROMORPHONE HCL PF 1 MG/ML IJ SOLN
INTRAMUSCULAR | Status: AC
  Filled 2012-11-07: qty 1

## 2012-11-07 MED ORDER — PROMETHAZINE HCL 25 MG/ML IJ SOLN: 6.2500 mg | INTRAMUSCULAR | Status: DC | PRN

## 2012-11-07 MED ORDER — CEFAZOLIN SODIUM 1-5 GM-% IV SOLN
1.0000 g | Freq: Three times a day (TID) | INTRAVENOUS | Status: AC
  Administered 2012-11-07: 1 g via INTRAVENOUS
  Filled 2012-11-07 (×2): qty 50

## 2012-11-07 MED ORDER — PHENYLEPHRINE HCL 10 MG/ML IJ SOLN
INTRAMUSCULAR | Status: DC | PRN
  Administered 2012-11-07: 40 ug via INTRAVENOUS

## 2012-11-07 MED ORDER — VALACYCLOVIR HCL 500 MG PO TABS
500.0000 mg | ORAL_TABLET | Freq: Every day | ORAL | Status: DC
  Administered 2012-11-08 – 2012-11-10 (×3): 500 mg via ORAL
  Filled 2012-11-07 (×4): qty 1

## 2012-11-07 MED ORDER — ACETAMINOPHEN 325 MG PO TABS: 650.0000 mg | ORAL_TABLET | ORAL | Status: DC | PRN

## 2012-11-07 MED ORDER — HEMOSTATIC AGENTS (NO CHARGE) OPTIME
TOPICAL | Status: DC | PRN
  Administered 2012-11-07: 1 via TOPICAL

## 2012-11-07 MED ORDER — MENTHOL 3 MG MT LOZG
1.0000 | LOZENGE | OROMUCOSAL | Status: DC | PRN
Start: 2012-11-07 — End: 2012-11-10

## 2012-11-07 MED ORDER — DEXAMETHASONE SODIUM PHOSPHATE 4 MG/ML IJ SOLN
INTRAMUSCULAR | Status: DC | PRN
  Administered 2012-11-07: 10 mg via INTRAVENOUS

## 2012-11-07 MED ORDER — SENNA 8.6 MG PO TABS
1.0000 | ORAL_TABLET | Freq: Two times a day (BID) | ORAL | Status: DC
  Administered 2012-11-07 – 2012-11-10 (×6): 8.6 mg via ORAL
  Filled 2012-11-07 (×8): qty 1

## 2012-11-07 MED ORDER — ARTIFICIAL TEARS OP OINT
TOPICAL_OINTMENT | OPHTHALMIC | Status: DC | PRN
  Administered 2012-11-07: 1 via OPHTHALMIC

## 2012-11-07 MED ORDER — GLYCOPYRROLATE 0.2 MG/ML IJ SOLN
INTRAMUSCULAR | Status: DC | PRN
  Administered 2012-11-07: 0.4 mg via INTRAVENOUS

## 2012-11-07 MED ORDER — THROMBIN 20000 UNITS EX SOLR
CUTANEOUS | Status: DC | PRN
  Administered 2012-11-07: 08:00:00 via TOPICAL

## 2012-11-07 MED ORDER — MAGNESIUM CITRATE PO SOLN
1.0000 | Freq: Once | ORAL | Status: AC | PRN
  Filled 2012-11-07: qty 296

## 2012-11-07 MED ORDER — BENAZEPRIL-HYDROCHLOROTHIAZIDE 20-25 MG PO TABS: 1.0000 | ORAL_TABLET | Freq: Every morning | ORAL | Status: DC

## 2012-11-07 MED ORDER — LIDOCAINE-EPINEPHRINE 0.5 %-1:200000 IJ SOLN
INTRAMUSCULAR | Status: DC | PRN
  Administered 2012-11-07: 10 mL

## 2012-11-07 MED ORDER — FENTANYL CITRATE 0.05 MG/ML IJ SOLN
INTRAMUSCULAR | Status: DC | PRN
  Administered 2012-11-07: 50 ug via INTRAVENOUS
  Administered 2012-11-07: 100 ug via INTRAVENOUS
  Administered 2012-11-07: 50 ug via INTRAVENOUS
  Administered 2012-11-07 (×4): 100 ug via INTRAVENOUS

## 2012-11-07 MED ORDER — POLYETHYLENE GLYCOL 3350 17 G PO PACK
17.0000 g | PACK | Freq: Every day | ORAL | Status: DC | PRN
  Filled 2012-11-07: qty 1

## 2012-11-07 SURGICAL SUPPLY — 69 items
BENZOIN TINCTURE PRP APPL 2/3 (GAUZE/BANDAGES/DRESSINGS) IMPLANT
BLADE SURG ROTATE 9660 (MISCELLANEOUS) IMPLANT
BONE MATRIX OSTEOCEL PLUS 5CC (Bone Implant) ×2 IMPLANT
BUR MATCHSTICK NEURO 3.0 LAGG (BURR) ×2 IMPLANT
CAGE COROENT 10X9X23 (Cage) ×4 IMPLANT
CANISTER SUCTION 2500CC (MISCELLANEOUS) ×2 IMPLANT
CLOTH BEACON ORANGE TIMEOUT ST (SAFETY) ×2 IMPLANT
CONT SPEC 4OZ CLIKSEAL STRL BL (MISCELLANEOUS) ×2 IMPLANT
COVER BACK TABLE 24X17X13 BIG (DRAPES) IMPLANT
DECANTER SPIKE VIAL GLASS SM (MISCELLANEOUS) ×2 IMPLANT
DERMABOND ADVANCED (GAUZE/BANDAGES/DRESSINGS) ×1
DERMABOND ADVANCED .7 DNX12 (GAUZE/BANDAGES/DRESSINGS) ×1 IMPLANT
DRAPE C-ARM 42X72 X-RAY (DRAPES) ×4 IMPLANT
DRAPE C-ARMOR (DRAPES) ×2 IMPLANT
DRAPE LAPAROTOMY 100X72X124 (DRAPES) ×2 IMPLANT
DRAPE POUCH INSTRU U-SHP 10X18 (DRAPES) ×2 IMPLANT
DRAPE SURG 17X23 STRL (DRAPES) ×2 IMPLANT
DRESSING TELFA 8X3 (GAUZE/BANDAGES/DRESSINGS) IMPLANT
DURAPREP 26ML APPLICATOR (WOUND CARE) ×2 IMPLANT
ELECT REM PT RETURN 9FT ADLT (ELECTROSURGICAL) ×2
ELECTRODE REM PT RTRN 9FT ADLT (ELECTROSURGICAL) ×1 IMPLANT
GAUZE SPONGE 4X4 16PLY XRAY LF (GAUZE/BANDAGES/DRESSINGS) IMPLANT
GLOVE BIO SURGEON STRL SZ8 (GLOVE) ×2 IMPLANT
GLOVE BIOGEL PI IND STRL 7.5 (GLOVE) ×1 IMPLANT
GLOVE BIOGEL PI IND STRL 8 (GLOVE) ×4 IMPLANT
GLOVE BIOGEL PI INDICATOR 7.5 (GLOVE) ×1
GLOVE BIOGEL PI INDICATOR 8 (GLOVE) ×4
GLOVE ECLIPSE 6.5 STRL STRAW (GLOVE) ×4 IMPLANT
GLOVE ECLIPSE 7.5 STRL STRAW (GLOVE) ×2 IMPLANT
GLOVE EXAM NITRILE LRG STRL (GLOVE) ×2 IMPLANT
GLOVE EXAM NITRILE MD LF STRL (GLOVE) IMPLANT
GLOVE EXAM NITRILE XL STR (GLOVE) ×2 IMPLANT
GLOVE EXAM NITRILE XS STR PU (GLOVE) IMPLANT
GLOVE INDICATOR 8.5 STRL (GLOVE) ×6 IMPLANT
GOWN BRE IMP SLV AUR LG STRL (GOWN DISPOSABLE) ×4 IMPLANT
GOWN BRE IMP SLV AUR XL STRL (GOWN DISPOSABLE) ×2 IMPLANT
GOWN STRL REIN 2XL LVL4 (GOWN DISPOSABLE) ×4 IMPLANT
KIT BASIN OR (CUSTOM PROCEDURE TRAY) ×2 IMPLANT
KIT POSITION SURG JACKSON T1 (MISCELLANEOUS) ×2 IMPLANT
KIT ROOM TURNOVER OR (KITS) ×2 IMPLANT
LIGHT SOURCE ANGLE TIP STR 7FT (MISCELLANEOUS) ×2 IMPLANT
NEEDLE HYPO 25X1 1.5 SAFETY (NEEDLE) ×2 IMPLANT
NEEDLE SPNL 18GX3.5 QUINCKE PK (NEEDLE) IMPLANT
NS IRRIG 1000ML POUR BTL (IV SOLUTION) ×2 IMPLANT
PACK LAMINECTOMY NEURO (CUSTOM PROCEDURE TRAY) ×2 IMPLANT
PAD ARMBOARD 7.5X6 YLW CONV (MISCELLANEOUS) ×4 IMPLANT
ROD 30MM (Rod) ×1 IMPLANT
ROD 35MM (Rod) ×2 IMPLANT
ROD PLIF MAS PB SPHERX 30 (Rod) ×1 IMPLANT
SCREW 5.0X30 (Screw) ×2 IMPLANT
SCREW LOCK (Screw) ×4 IMPLANT
SCREW LOCK FXNS SPNE MAS PL (Screw) ×4 IMPLANT
SCREW SHANK 5.5X40MM (Screw) ×4 IMPLANT
SCREW SHANK 6.5X30 (Screw) ×2 IMPLANT
SCREW TULIP 5.5 (Screw) ×6 IMPLANT
SPONGE GAUZE 4X4 12PLY (GAUZE/BANDAGES/DRESSINGS) IMPLANT
SPONGE LAP 4X18 X RAY DECT (DISPOSABLE) IMPLANT
SPONGE SURGIFOAM ABS GEL 100 (HEMOSTASIS) ×2 IMPLANT
STRIP CLOSURE SKIN 1/2X4 (GAUZE/BANDAGES/DRESSINGS) IMPLANT
SUT PROLENE 6 0 BV (SUTURE) IMPLANT
SUT VIC AB 0 CT1 18XCR BRD8 (SUTURE) ×1 IMPLANT
SUT VIC AB 0 CT1 8-18 (SUTURE) ×1
SUT VIC AB 2-0 CT1 18 (SUTURE) ×2 IMPLANT
SUT VIC AB 3-0 SH 8-18 (SUTURE) ×2 IMPLANT
SYR 20ML ECCENTRIC (SYRINGE) ×2 IMPLANT
TOWEL OR 17X24 6PK STRL BLUE (TOWEL DISPOSABLE) ×2 IMPLANT
TOWEL OR 17X26 10 PK STRL BLUE (TOWEL DISPOSABLE) ×2 IMPLANT
TRAY FOLEY CATH 14FRSI W/METER (CATHETERS) ×2 IMPLANT
WATER STERILE IRR 1000ML POUR (IV SOLUTION) ×2 IMPLANT

## 2012-11-07 NOTE — Op Note (Signed)
11/07/2012  7:24 PM  PATIENT:  Vickie Lane  51 y.o. 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  PROCEDURE:  Procedure(s): POSTERIOR LUMBAR FUSION 1 LEVEL L4/5 Posterior lumbar interbody arthrodesis L4/5 with Nuvasive Peek 10mm interbodies with morselized  Local autograft Posterolateral arthrodesis, morselized allograft Non segmental pedicle screw fixation MAS PLIf instrumentation nuvasive L4,5  SURGEON:  Surgeon(s): Carmela Hurt, MD Mariam Dollar, MD  ASSISTANTS:Cram, Henreitta Cea  ANESTHESIA:   general  EBL:     BLOOD ADMINISTERED:none  CELL SAVER GIVEN:none  COUNT:per nursing  DRAINS: none   SPECIMEN:  No Specimen  DICTATION: Vickie Lane was taken to the operating room intubated and placed under a general anesthetic without difficulty. A foley catheter was placed under sterile conditions. She was positioned prone on a Jackson table with all pressure points padded. Her back was prepped and draped in a sterile manner with duraprep. I infiltrated 1/2% lidocaine into the planned incision in the lumbar region. I opened the skin with a 10 blade and used the monopolar cautery to expose the L4 and L5 lamina bilaterally. I dissected laterally to expose the pars interarticularis of L4 and L5 bilaterally. I placed self retaining retractors and proceeded with pedicle screw placement. With fluoroscopic guidance Dr. Wynetta Emery and I were able to place the screws in the medial to lateral trajectory at L4. However at L5 used to simply did not have a good enough view using fluoroscopy using a at that point I decided to proceed with the decompression and posterior lumbar interbody arthrodesis.  I used the drill Kerrison punches and curettes to decompress the  spinal canal between the L4 and L5 lamina. I exposed the disc spaces I lateral and performed discectomies bilaterally at L4-5. Dr. Wynetta Emery and I will able to achieve a very good decompression of the thecal sac L4 and L5 nerve roots in the bilateral manner. I used curettes disc space shavers and a rasp to appear the endplates and disc space for the peek interbodies. After measuring the disc space I felt the 10 mm I believe 22 mm peek cages would work best. These were filled with morcellized autograft. Dr. Wynetta Emery and I placed these without difficulty into the disc space on both right and left sides at L4-5. This was done with fluoroscopic guidance. Having now expose the pedicles of L5 bilaterally I was able to place screws without problem I use a traditional trajectory on the right side at L5 and a medial to lateral trajectory on the left side at L5. F I decorticated the lateral bone near the facet and the pars interarticularis of L4 and L5. I placed morselized allograft over these areas to complete the posterolateral arthrodesis. luoroscopy showed all the screws to be in good position. I then connected the crews together by placing a rod in the screw heads and using a In secure position. I locked this into position with a torque limited screwdriver. I irrigated the wound. I closed the wound in layered fashion using Vicryl sutures to reapproximate the thoracolumbar subcutaneous and subcuticular layers. I used Dermabond for a sterile dressing. The patient was rolled supine onto the stretcher and extubated without difficulty.  PLAN OF CARE: Admit to inpatient   PATIENT DISPOSITION:  PACU - hemodynamically stable.   Delay start of Pharmacological VTE agent (>24hrs) due to surgical  blood loss or risk of bleeding:  yes

## 2012-11-07 NOTE — Transfer of Care (Signed)
Immediate Anesthesia Transfer of Care Note  Patient: [name]  Procedure(s) Performed: Procedure(s) (LRB) with comments: POSTERIOR LUMBAR FUSION 1 LEVEL (N/A) - Lumbar four-five posterior lumbar interbody fusion with interbody prothesis posterolateral arthrodesis and posterior nonsegmental instrumentation  Patient Location: PACU  Anesthesia Type:General  Level of Consciousness: sedated and patient cooperative  Airway & Oxygen Therapy: Patient Spontanous Breathing and Patient connected to nasal cannula oxygen  Post-op Assessment: Report given to PACU RN, Post -op Vital signs reviewed and stable and Patient moving all extremities X 4  Post vital signs: Reviewed and stable  Complications: No apparent anesthesia complications

## 2012-11-07 NOTE — Anesthesia Preprocedure Evaluation (Addendum)
Anesthesia Evaluation  Patient identified by MRN, date of birth, ID band Patient awake, Patient confused and Patient unresponsive    Reviewed: Allergy & Precautions, H&P , NPO status , Patient's Chart, lab work & pertinent test results  History of Anesthesia Complications Negative for: history of anesthetic complications  Airway       Dental   Pulmonary neg pulmonary ROS,          Cardiovascular Exercise Tolerance: Good hypertension, Pt. on medications + Valvular Problems/Murmurs MVP  mvp not reevaluated in years pt with good exercise tolerance and no sob or cp bp under good control    Neuro/Psych    GI/Hepatic Neg liver ROS, GERD-  Medicated,  Endo/Other  diabetes, Well Controlled, Type 2  Renal/GU negative Renal ROS     Musculoskeletal   Abdominal   Peds  Hematology negative hematology ROS (+) Blood dyscrasia, Sickle cell trait ,   Anesthesia Other Findings SLE no end organ involvement   Reproductive/Obstetrics                        Anesthesia Physical Anesthesia Plan  ASA: II  Anesthesia Plan: General   Post-op Pain Management:    Induction: Intravenous  Airway Management Planned: Oral ETT  Additional Equipment:   Intra-op Plan:   Post-operative Plan: Extubation in OR  Informed Consent: I have reviewed the patients History and Physical, chart, labs and discussed the procedure including the risks, benefits and alternatives for the proposed anesthesia with the patient or authorized representative who has indicated his/her understanding and acceptance.   Dental advisory given  Plan Discussed with: CRNA, Anesthesiologist and Surgeon  Anesthesia Plan Comments:        Anesthesia Quick Evaluation

## 2012-11-07 NOTE — H&P (Signed)
BP 122/80  Pulse 65  Temp 97 F (36.1 C) (Oral)  Resp 16  SpO2 99% KIMIMILA TAUZIN is a 51 y.o. 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 She has had increasing back pain over the last 2 years despite conservative treatment. She has decided to undergo operative decompression and stabilization.  Allergies  Allergen Reactions  . Ciprofloxacin Anaphylaxis  . Levofloxacin Anaphylaxis  . Codeine     Upset stomach   . Hydrocodone     Upset stomach  . Ultram (Tramadol Hcl)     Upset stomach   Past Medical History  Diagnosis Date  . Hyperlipidemia     LDL  . GERD (gastroesophageal reflux disease)   . Systemic lupus erythematosus 04/03/10    joint pain  . Hypertension 04/03/10  . Complication of anesthesia     somthing made her itch  . Diabetes mellitus October 2012    T2DM.  Diet and exercise.  Marland Kitchen Heart murmur   . Mitral valve prolapse   . Constipation   . Arthritis   . Sickle cell trait   . History of blood transfusion   ' Past Surgical History  Procedure Date  . Cesarean section 1991, 1993    x2  . Abdominal hysterectomy 1997  . Wrist fusion 2008   Prior to Admission medications   Medication Sig Start Date End Date Taking? Authorizing Provider  amLODipine (NORVASC) 10 MG tablet Take 10 mg by mouth at bedtime.    Yes Historical Provider, MD  aspirin EC 81 MG tablet Take 81 mg by mouth every morning.   Yes Historical Provider, MD  benazepril-hydrochlorthiazide (LOTENSIN HCT) 20-25 MG per tablet Take 1 tablet by mouth every morning.    Yes Historical Provider, MD  Cetirizine-Pseudoephedrine (ZYRTEC-D PO) Take 1 tablet by mouth daily as needed. For allergies   Yes Historical Provider, MD  cloNIDine (CATAPRES) 0.1 MG tablet Take 0.1 mg by mouth 2 (two) times daily.   Yes Historical Provider, MD  cyclobenzaprine (FLEXERIL) 10 MG tablet Take 10 mg by mouth at bedtime.   Yes Historical Provider, MD  diclofenac (VOLTAREN)  75 MG EC tablet Take 75 mg by mouth every evening.    Yes Historical Provider, MD  estradiol (CLIMARA) 0.1 mg/24hr Place 1 patch onto the skin once a week. Sunday   Yes Historical Provider, MD  valACYclovir (VALTREX) 500 MG tablet Take 500 mg by mouth daily.    Yes Historical Provider, MD   History   Social History  . Marital Status: Single    Spouse Name: N/A    Number of Children: N/A  . Years of Education: N/A   Occupational History  . Not on file.   Social History Main Topics  . Smoking status: Never Smoker   . Smokeless tobacco: Never Used  . Alcohol Use: No  . Drug Use: No  . Sexually Active: Not on file   Other Topics Concern  . Not on file   Social History Narrative  . No narrative on file   Family History  Problem Relation Age of Onset  . Heart attack Father   . Heart disease Other   . Hypertension Other    Physical Exam  Constitutional: She is oriented to person, place, and time. She appears well-developed and well-nourished. No distress.  HENT:  Head: Normocephalic and atraumatic.  Right Ear: External ear normal.  Left Ear: External ear normal.  Nose: Nose normal.  Mouth/Throat: Oropharynx  is clear and moist.  Eyes: Conjunctivae normal and EOM are normal. Pupils are equal, round, and reactive to light.  Neck: Normal range of motion. Neck supple.  Cardiovascular: Normal rate, regular rhythm and normal heart sounds.   Pulmonary/Chest: Effort normal and breath sounds normal.  Abdominal: Soft. Bowel sounds are normal.  Musculoskeletal: Normal range of motion.  Neurological: She is alert and oriented to person, place, and time. She has normal reflexes. She displays normal reflexes. No cranial nerve deficit. She exhibits normal muscle tone. Coordination normal.  Skin: Skin is warm and dry.  Psychiatric: She has a normal mood and affect. Her behavior is normal. Judgment and thought content normal.  BP 122/80  Pulse 65  Temp 97 F (36.1 C) (Oral)  Resp 16   SpO2 99% Mrs. Rivers has decided to undergo a lumbar discetomy/decompression and fusion for stenosis at levels L4/5. Risks and benefits including but not limited to bleeding, infection, paralysis, weakness in one or both extremities, bowel and/or bladder dysfunction, need for further surgery,hardware failure, fusions failure, and other risks including no relief of pain. Mrs. Rivers understands and wishes to proceed.

## 2012-11-07 NOTE — Anesthesia Postprocedure Evaluation (Signed)
Anesthesia Post Note  Patient: Vickie Lane  Procedure(s) Performed: Procedure(s) (LRB): POSTERIOR LUMBAR FUSION 1 LEVEL (N/A)  Anesthesia type: general  Patient location: PACU  Post pain: Pain level controlled  Post assessment: Patient's Cardiovascular Status Stable  Last Vitals:  Filed Vitals:   11/07/12 1345  BP: 126/74  Pulse: 72  Temp:   Resp: 29    Post vital signs: Reviewed and stable  Level of consciousness: sedated  Complications: No apparent anesthesia complications

## 2012-11-08 NOTE — Evaluation (Signed)
Physical Therapy Evaluation Patient Details Name: Vickie Lane MRN: [id number] DOB: Mar 26, 1961 Today's Date: 11/08/2012 Time: 4098-1191 PT Time Calculation (min): 20 min  PT Assessment / Plan / Recommendation Clinical Impression  Pt s/p PLF L4-5. Pt with decreased functional mobility and functional strength. Pt will benefit from skilled PT to address the above deficits and maximize functional mobility prior to a d/c home with family safely.     PT Assessment  Patient needs continued PT services    Follow Up Recommendations  No PT follow up;Supervision for mobility/OOB    Does the patient have the potential to tolerate intense rehabilitation      Barriers to Discharge        Equipment Recommendations  None recommended by PT    Recommendations for Other Services     Frequency Min 5X/week    Precautions / Restrictions Precautions Precautions: Back Precaution Booklet Issued: Yes (comment) Precaution Comments: pt educated on 3/3 back precautions Restrictions Weight Bearing Restrictions: No   Pertinent Vitals/Pain Pain 4/10 this session. PCA encouraged      Mobility  Bed Mobility Bed Mobility: Rolling Left;Left Sidelying to Sit;Sitting - Scoot to Edge of Bed Rolling Left: 5: Supervision Left Sidelying to Sit: 4: Min guard Sitting - Scoot to [place] of Bed: 4: Min guard Details for Bed Mobility Assistance: VC for safe technique to maintain back precautions during log roll  Transfers Transfers: Stand to Sit;Sit to Stand Sit to Stand: 4: Min assist;With upper extremity assist;From bed;From toilet Stand to Sit: 4: Min assist;With upper extremity assist;To chair/3-in-1;To toilet Details for Transfer Assistance: VC for safe hand placement and technique to maintain back precautions during stand. Pt able to control descent Ambulation/Gait Ambulation/Gait Assistance: 4: Min guard;4: Min Environmental consultant (Feet): 100 Feet Assistive device: None;1 person hand held  assist Ambulation/Gait Assistance Details: Cues for proper technique and upright posture. Pt requiring minimal assistance/minguard for support.  Gait Pattern: Step-to pattern;Narrow base of support Gait velocity: slow Stairs: No    Shoulder Instructions     Exercises     PT Diagnosis: Acute pain  PT Problem List: Decreased activity tolerance;Decreased mobility;Decreased knowledge of use of DME;Decreased safety awareness;Decreased knowledge of precautions;Pain PT Treatment Interventions: DME instruction;Gait training;Stair training;Functional mobility training;Therapeutic activities;Patient/family education   PT Goals Acute Rehab PT Goals PT Goal Formulation: With patient Time For Goal Achievement: 11/15/12 Potential to Achieve Goals: Good Pt will go Supine/Side to Sit: with modified independence PT Goal: Supine/Side to Sit - Progress: Goal set today Pt will go Sit to Supine/Side: with modified independence PT Goal: Sit to Supine/Side - Progress: Goal set today Pt will go Sit to Stand: with modified independence PT Goal: Sit to Stand - Progress: Goal set today Pt will go Stand to Sit: with modified independence PT Goal: Stand to Sit - Progress: Goal set today Pt will Transfer Bed to Chair/Chair to Bed: with modified independence PT Transfer Goal: Bed to Chair/Chair to Bed - Progress: Goal set today Pt will Ambulate: >150 feet;with modified independence;with least restrictive assistive device PT Goal: Ambulate - Progress: Goal set today Pt will Go Up / Down Stairs: 6-9 stairs;with supervision PT Goal: Up/Down Stairs - Progress: Goal set today  Visit Information  Last PT Received On: 11/08/12 Assistance Needed: +1    Subjective Data  Patient Stated Goal: to go home   Prior Functioning  Home Living Lives With: Family Available Help at Discharge: Family;Available 24 hours/day Type of Home: House Home Access: Stairs to enter Entrance  Stairs-Number of Steps: 3 Entrance  Stairs-Rails: None Home Layout: Two level Alternate Level Stairs-Number of Steps: 12 Alternate Level Stairs-Rails: Right (right going down(basement)) Bathroom Shower/Tub: Walk-in shower;Door [employer] Toilet: Handicapped height Bathroom Accessibility: Yes How Accessible: Accessible via walker Home Adaptive Equipment: Tub transfer bench;Other (comment);Bedside commode/3-in-1 (rollator) Prior Function Level of Independence: Independent Able to Take Stairs?: Yes Driving: Yes 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 Dominant Hand: Right    Cognition  Overall Cognitive Status: Appears within functional limits for tasks assessed/performed Arousal/Alertness: Awake/alert Orientation Level: Appears intact for tasks assessed Behavior During Session: [facility] for tasks performed    Extremity/Trunk Assessment Right Lower Extremity Assessment RLE ROM/Strength/Tone: Within functional levels RLE Sensation: WFL - Light Touch Left Lower Extremity Assessment LLE ROM/Strength/Tone: Within functional levels LLE Sensation: WFL - Light Touch   Balance    End of Session PT - End of Session Equipment Utilized During Treatment: Gait belt Activity Tolerance: Patient tolerated treatment well Patient left: in chair;with call bell/phone within reach Nurse Communication: Mobility status  GP     Milana Kidney 11/08/2012, 10:04 AM  11/08/2012 Milana Kidney DPT PAGER: [phone] OFFICE: [phone]

## 2012-11-08 NOTE — Plan of Care (Signed)
Problem: Consults Goal: Diagnosis - Spinal Surgery Outcome: Progressing Thoraco/Lumbar Spine Fusion     

## 2012-11-08 NOTE — Evaluation (Signed)
Occupational Therapy Evaluation Patient Details Name: MAKAI AGOSTINELLI MRN: [id number] DOB: 11-01-1961 Today's Date: 11/08/2012 Time: 9147-8295 OT Time Calculation (min): 21 min  OT Assessment / Plan / Recommendation Clinical Impression  This 51 yo s/p back fusion presents to acute OT with problems below. Will benefit from acute OT without need for follow up.    OT Assessment  Patient needs continued OT Services    Follow Up Recommendations  No OT follow up    Barriers to Discharge None    Equipment Recommendations  None recommended by OT       Frequency  Min 2X/week    Precautions / Restrictions Precautions Precautions: Back Precaution Booklet Issued: Yes (comment) Precaution Comments: pt educated on 3/3 back precautions Restrictions Weight Bearing Restrictions: No        ADL  Eating/Feeding: Simulated;Independent Where Assessed - Eating/Feeding: Chair Grooming: Performed;Wash/dry hands;Supervision/safety Where Assessed - Grooming: Unsupported standing Upper Body Bathing: Simulated;Set up;Supervision/safety Where Assessed - Upper Body Bathing: Unsupported sitting Lower Body Bathing: Simulated;Minimal assistance Where Assessed - Lower Body Bathing: Unsupported sit to stand Upper Body Dressing: Simulated;Supervision/safety;Set up Where Assessed - Upper Body Dressing: Unsupported sitting Lower Body Dressing: Simulated;Minimal assistance Where Assessed - Lower Body Dressing: Unsupported sit to stand Toilet Transfer: Performed;Supervision/safety Toilet Transfer Method: Sit to Barista: Comfort height toilet;Grab bars Toileting - Clothing Manipulation and Hygiene: Performed;Modified independent Where Assessed - Toileting Clothing Manipulation and Hygiene: Standing Equipment Used: Back brace;Rolling walker Transfers/Ambulation Related to ADLs: Supervision ADL Comments: Can easily cross one leg over the other but not the other    OT Diagnosis:  Generalized weakness;Acute pain  OT Problem List: Decreased strength;Pain;Decreased knowledge of use of DME or AE;Decreased knowledge of precautions OT Treatment Interventions: Self-care/ADL training;Patient/family education;DME and/or AE instruction;Balance training   OT Goals Acute Rehab OT Goals OT Goal Formulation: With patient Time For Goal Achievement: 11/15/12 Potential to Achieve Goals: Good ADL Goals Pt Will Perform Lower Body Dressing: with modified independence;Sit to stand from chair;Sit to stand from bed;Unsupported;with adaptive equipment ADL Goal: Lower Body Dressing - Progress: Goal set today Pt Will Transfer to Toilet: with modified independence;Grab bars;3-in-1;Comfort height toilet;Maintaining back safety precautions ADL Goal: Toilet Transfer - Progress: Goal set today Pt Will Perform Toileting - Clothing Manipulation: with modified independence;Standing ADL Goal: Toileting - Clothing Manipulation - Progress: Goal set today Pt Will Perform Toileting - Hygiene: with modified independence;Sit to stand from 3-in-1/toilet ADL Goal: Toileting - Hygiene - Progress: Goal set today Pt Will Perform Tub/Shower Transfer: Shower transfer;with modified independence;Ambulation;Transfer tub bench ADL Goal: Tub/Shower Transfer - Progress: Goal set today Miscellaneous OT Goals Miscellaneous OT Goal #1: Pt will be able to state and follow back precautions OT Goal: Miscellaneous Goal #1 - Progress: Goal set today Miscellaneous OT Goal #2: Pt will be Mod I in and OOB OT Goal: Miscellaneous Goal #2 - Progress: Goal set today  Visit Information  Last OT Received On: 11/08/12 Assistance Needed: +1 PT/OT Co-Evaluation/Treatment: Yes    Subjective Data  Subjective: It feels good to be out of the bed Patient Stated Goal: Did not ask   Prior Functioning     Home Living Lives With: Family Available Help at Discharge: Family;Available 24 hours/day Type of Home: House Home Access:  Stairs to enter [employer] of Steps: 3 Entrance Stairs-Rails: None Home Layout: Two level Alternate Level Stairs-Number of Steps: 12 Alternate Level Stairs-Rails: Right (right going down(basement)) Bathroom Shower/Tub: Walk-in shower;Door Bathroom Toilet: Handicapped height Bathroom Accessibility: Yes How  Accessible: Accessible via walker Home Adaptive Equipment: Tub transfer bench;Other (comment);Bedside commode/3-in-1 (rollator) Prior Function Level of Independence: Independent Able to Take Stairs?: Yes Driving: Yes 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 Dominant Hand: Right            Cognition  Overall Cognitive Status: Appears within functional limits for tasks assessed/performed Arousal/Alertness: Awake/alert Orientation Level: Appears intact for tasks assessed Behavior During Session: [facility] for tasks performed    Extremity/Trunk Assessment Right Upper Extremity Assessment RUE ROM/Strength/Tone: Within functional levels Left Upper Extremity Assessment LUE ROM/Strength/Tone: Within functional levels Right Lower Extremity Assessment RLE ROM/Strength/Tone: Within functional levels RLE Sensation: WFL - Light Touch Left Lower Extremity Assessment LLE ROM/Strength/Tone: Within functional levels LLE Sensation: WFL - Light Touch     Mobility Bed Mobility Bed Mobility: Rolling Left;Left Sidelying to Sit;Sitting - Scoot to Edge of Bed Rolling Left: 5: Supervision Left Sidelying to Sit: 4: Min guard Sitting - Scoot to [place] of Bed: 4: Min guard Details for Bed Mobility Assistance: VC for safe technique to maintain back precautions during log roll  Transfers Sit to Stand: 4: Min assist;With upper extremity assist;From bed;From toilet Stand to Sit: 4: Min assist;With upper extremity assist;To chair/3-in-1;To toilet Details for Transfer Assistance: VC for safe hand placement and technique to  maintain back precautions during stand. Pt able to control descent              End of Session OT - End of Session Equipment Utilized During Treatment:  (None) Activity Tolerance: Patient tolerated treatment well Patient left: in chair;with call bell/phone within reach Nurse Communication: Mobility status (+1 A no RW)       Evette Georges [phone] 11/08/2012, 10:50 AM

## 2012-11-08 NOTE — Progress Notes (Signed)
Patient ID: Vickie Lane, female   DOB: 09/10/1961, 51 y.o.   MRN: [id number] BP 129/73  Pulse 64  Temp 98 F (36.7 C) (Oral)  Resp 16  Ht 5\' 6"  (1.676 m)  Wt 75.297 kg (166 lb)  BMI 26.79 kg/m2  SpO2 100% Alert and oriented x 4 Wound is clean dry and without signs of infection 5/5 strength in lower extremities Continue pca

## 2012-11-09 MED ORDER — OXYCODONE HCL 5 MG PO TABS
5.0000 mg | ORAL_TABLET | ORAL | Status: DC | PRN
  Administered 2012-11-09 – 2012-11-10 (×3): 5 mg via ORAL
  Filled 2012-11-09 (×3): qty 1

## 2012-11-09 MED ORDER — HYDROCODONE-ACETAMINOPHEN 5-325 MG PO TABS: 2.0000 | ORAL_TABLET | ORAL | Status: DC | PRN

## 2012-11-09 NOTE — Progress Notes (Signed)
Occupational Therapy Treatment Patient Details Name: Vickie Lane MRN: [id number] DOB: July 31, 1961 Today's Date: 11/09/2012 Time: 1725-1749 OT Time Calculation (min): 24 min  OT Assessment / Plan / Recommendation Comments on Treatment Session Excellent progress. Pt is able to verbalize or demonstrate adherance to back precations. Pt has nec DME and support for home D/C.    Follow Up Recommendations  No OT follow up    Barriers to Discharge   none    Equipment Recommendations  None recommended by OT    Recommendations for Other Services  none  Frequency Min 2X/week   Plan Discharge plan remains appropriate    Precautions / Restrictions Precautions Precautions: Back Precaution Comments: able to recall 3/3 precautions   Pertinent Vitals/Pain 3    ADL  Toilet Transfer: Modified independent Toilet Transfer Method: Sit to stand Toilet Transfer Equipment: Comfort height toilet Tub/Shower Transfer: Supervision/safety Tub/Shower Transfer Method: Science writer: Shower seat with back;Walk in shower Transfers/Ambulation Related to ADLs: mod I ADL Comments: able to cross both legs for ADL. REviewed back precautions for ADl. Discussed simplifying home activites in order to adhere to back precautions.Pt verbalized understanding.    OT Diagnosis:    OT Problem List:   OT Treatment Interventions:     OT Goals Acute Rehab OT Goals OT Goal Formulation: With patient Time For Goal Achievement: 11/15/12 Potential to Achieve Goals: Good ADL Goals Pt Will Perform Lower Body Dressing: with modified independence;Sit to stand from chair;Sit to stand from bed;Unsupported;with adaptive equipment ADL Goal: Lower Body Dressing - Progress: Met Pt Will Transfer to Toilet: with modified independence;Grab bars;3-in-1;Comfort height toilet;Maintaining back safety precautions ADL Goal: Toilet Transfer - Progress: Met Pt Will Perform Toileting - Clothing Manipulation: with  modified independence;Standing ADL Goal: Toileting - Clothing Manipulation - Progress: Met Pt Will Perform Toileting - Hygiene: with modified independence;Sit to stand from 3-in-1/toilet ADL Goal: Toileting - Hygiene - Progress: Met Pt Will Perform Tub/Shower Transfer: Shower transfer;with modified independence;Ambulation;Transfer tub bench ADL Goal: Tub/Shower Transfer - Progress: Progressing toward goals Miscellaneous OT Goals Miscellaneous OT Goal #1: Pt will be able to state and follow back precautions OT Goal: Miscellaneous Goal #1 - Progress: Met Miscellaneous OT Goal #2: Pt will be Mod I in and OOB OT Goal: Miscellaneous Goal #2 - Progress: Met  Visit Information  Last OT Received On: 11/09/12 Assistance Needed: +1    Subjective Data      Prior Functioning       Cognition  Overall Cognitive Status: Appears within functional limits for tasks assessed/performed Arousal/Alertness: Awake/alert Orientation Level: Appears intact for tasks assessed Behavior During Session: [facility] for tasks performed    Mobility  Shoulder Instructions Bed Mobility Bed Mobility: Supine to Sit Rolling Left: 6: Modified independent (Device/Increase time) Transfers Transfers: Sit to Stand;Stand to Sit Sit to Stand: 6: Modified independent (Device/Increase time) Stand to Sit: 6: Modified independent (Device/Increase time)       Exercises      Balance  WFL   End of Session OT - End of Session Activity Tolerance: Patient tolerated treatment well Patient left: in chair;with call bell/phone within reach Nurse Communication: Mobility status  GO     Vickie Lane,Vickie Lane 11/09/2012, 5:56 PM [facility], OTR/L  [phone] 11/09/2012

## 2012-11-09 NOTE — Progress Notes (Signed)
Physical Therapy Treatment Patient Details Name: Vickie Lane MRN: [id number] DOB: [birth year] Today's Date: 11/09/2012 Time: 4696-2952 PT Time Calculation (min): 23 min  PT Assessment / Plan / Recommendation Comments on Treatment Session  Pt progressing very well. Pt safe to d/c home when approved by MD. Pt functioning at supervision/modified independent level and has 24/7 assist at home.    Follow Up Recommendations  No PT follow up;Supervision for mobility/OOB     Does the patient have the potential to tolerate intense rehabilitation     Barriers to Discharge        Equipment Recommendations  None recommended by PT    Recommendations for Other Services    Frequency Min 3X/week   Plan Discharge plan remains appropriate;Frequency needs to be updated    Precautions / Restrictions Precautions Precautions: Back Precaution Comments: pt with good recall Restrictions Weight Bearing Restrictions: No   Pertinent Vitals/Pain 5/10 surgical back pain    Mobility  Bed Mobility Bed Mobility: Rolling Left;Left Sidelying to Sit;Sitting - Scoot to [place] of Bed Rolling Left: 6: Modified independent (Device/Increase time) Left Sidelying to Sit: 6: Modified independent (Device/Increase time) Sitting - Scoot to Edge of Bed: 6: Modified independent (Device/Increase time) Details for Bed Mobility Assistance: went over transfer in/out of bed without hand rail, pt modified independent Transfers Transfers: Stand to Sit;Sit to Stand Sit to Stand: 6: Modified independent (Device/Increase time) Stand to Sit: 6: Modified independent (Device/Increase time) Ambulation/Gait Ambulation/Gait Assistance: 5: Supervision Ambulation Distance (Feet): 300 Feet Assistive device: None Gait Pattern: Step-through pattern;Decreased stride length (guarded posture) Gait velocity: slow Stairs: Yes Stairs Assistance: 4: Min assist (via HHA to mimic home set up) Stair Management Technique: No rails Number of  Stairs: 5  Wheelchair Mobility Wheelchair Mobility: No    Exercises     PT Diagnosis:    PT Problem List:   PT Treatment Interventions:     PT Goals Acute Rehab PT Goals PT Goal: Supine/Side to Sit - Progress: Met PT Goal: Sit to Supine/Side - Progress: Met PT Goal: Sit to Stand - Progress: Met PT Goal: Stand to Sit - Progress: Met PT Transfer Goal: Bed to Chair/Chair to Bed - Progress: Progressing toward goal PT Goal: Ambulate - Progress: Progressing toward goal PT Goal: Up/Down Stairs - Progress: Progressing toward goal  Visit Information  Last PT Received On: 11/09/12 Assistance Needed: +1    Subjective Data  Subjective: Pt received supine in bed agreeable to PT.   Cognition  Overall Cognitive Status: Appears within functional limits for tasks assessed/performed Arousal/Alertness: Awake/alert Orientation Level: Appears intact for tasks assessed Behavior During Session: [facility] for tasks performed    Balance     End of Session PT - End of Session Equipment Utilized During Treatment: Gait belt Activity Tolerance: Patient tolerated treatment well Patient left: in bed;with call bell/phone within reach Nurse Communication: Mobility status   GP     Marcene Brawn 11/09/2012, 11:36 AM  Lewis Shock, PT, DPT Pager #: [phone] Office #: [phone]

## 2012-11-10 MED ORDER — HYDROCODONE-ACETAMINOPHEN 5-325 MG PO TABS: 1.0000 | ORAL_TABLET | Freq: Four times a day (QID) | ORAL | Status: DC | PRN

## 2012-11-10 MED ORDER — CYCLOBENZAPRINE HCL 10 MG PO TABS: 10.0000 mg | ORAL_TABLET | Freq: Three times a day (TID) | ORAL | Status: DC | PRN

## 2012-11-10 NOTE — Care Management Note (Signed)
    Page 1 of 1   11/10/2012     1:56:29 PM   CARE MANAGEMENT NOTE 11/10/2012  Patient:  Vickie Lane, Vickie Lane   Account Number:  [id number]  Date Initiated:  11/07/2012  Documentation initiated by:  Jacquelynn Cree  Subjective/Objective Assessment:   Admitted postop PLIF L4-5     Action/Plan:   PT/OT evals-no follow up or equipment recommended   Anticipated DC Date:  11/10/2012   Anticipated DC Plan:  HOME/SELF CARE      DC Planning Services  CM consult      Choice offered to / List presented to:             Status of service:  Completed, signed off Medicare Important Message given?   (If response is "NO", the following Medicare IM given date fields will be blank) Date Medicare IM given:   Date Additional Medicare IM given:    Discharge Disposition:  HOME/SELF CARE  Per UR Regulation:  Reviewed for med. necessity/level of care/duration of stay  If discussed at Long Length of Stay Meetings, dates discussed:    Comments:

## 2012-11-10 NOTE — Discharge Summary (Signed)
Physician Discharge Summary  Patient ID: DENIESE OBERRY MRN: [id number] DOB/AGE: 1961/01/26 51 y.o.  Admit date: 11/07/2012 Discharge date: 11/10/2012  Admission Diagnoses:L4/5 lumbar spondylosis, stenosis, spondylolisthesis  Discharge Diagnoses:L4/5 lumbar spondylosis, stenosis, spondylolisthesis  Active Problems:  * No active hospital problems. *    Discharged Condition: good  Hospital Course: mrs. Rivers was admitted and taken to the operating room for decompression and arthrodesis, from L4 to L5. Postoperatively she has done well and is moving all extremities. Her wound is clean, dry,and without signs of infection. She is voiding, ambulating, and tolerating a regular diet.   Consults: None  Significant Diagnostic Studies: none  Treatments: surgery: POSTERIOR LUMBAR FUSION 1 LEVEL L4/5  Posterior lumbar interbody arthrodesis L4/5 with Nuvasive Peek 10mm interbodies with morselized Local autograft  Posterolateral arthrodesis, morselized allograft  Non segmental pedicle screw fixation MAS PLIf instrumentation nuvasive L4,5   Discharge Exam: Blood pressure 106/61, pulse 70, temperature 97.8 F (36.6 C), temperature source Oral, resp. rate 17, height 5\' 6"  (1.676 m), weight 75.297 kg (166 lb), SpO2 100.00%. General appearance: alert, cooperative and appears stated age Neurologic: Alert and oriented X 3, normal strength and tone. Normal symmetric reflexes. Normal coordination and gait  Disposition: Final discharge disposition not confirmed  Discharge Orders    Future Appointments: Provider: Department: Dept Phone: Center:   12/15/2012 9:00 AM [facility] Lovie Macadamia [facility] [phone]       Medication List     As of 11/10/2012 12:15 PM    TAKE these medications         amLODipine 10 MG tablet   Commonly known as: NORVASC   Take 10 mg by mouth at bedtime.      aspirin EC 81 MG tablet   Take 81 mg by mouth every morning.     benazepril-hydrochlorthiazide 20-25 MG per tablet   Commonly known as: LOTENSIN HCT   Take 1 tablet by mouth every morning.      CLIMARA 0.1 mg/24hr   Generic drug: estradiol   Place 1 patch onto the skin once a week. Sunday      cloNIDine 0.1 MG tablet   Commonly known as: CATAPRES   Take 0.1 mg by mouth 2 (two) times daily.      cyclobenzaprine 10 MG tablet   Commonly known as: FLEXERIL   Take 1 tablet (10 mg total) by mouth 3 (three) times daily as needed for muscle spasms.      cyclobenzaprine 10 MG tablet   Commonly known as: FLEXERIL   Take 10 mg by mouth at bedtime.      diclofenac 75 MG EC tablet   Commonly known as: VOLTAREN   Take 75 mg by mouth every evening.      HYDROcodone-acetaminophen 5-325 MG per tablet   Commonly known as: NORCO/VICODIN   Take 1 tablet by mouth every 6 (six) hours as needed for pain.      valACYclovir 500 MG tablet   Commonly known as: VALTREX   Take 500 mg by mouth daily.      ZYRTEC-D PO   Take 1 tablet by mouth daily as needed. For allergies           Follow-up Information    Follow up with Telicia Hodgkiss L, MD. In 3 weeks. (call to make appointment)    Contact information:   [address]  [address] [place] [address] [phone]          Signed: Edna Rede L 11/10/2012, 12:15 PM

## 2012-11-10 NOTE — Progress Notes (Signed)
Physical Therapy Treatment Patient Details Name: Vickie Lane MRN: [id number] DOB: 1961-06-02 Today's Date: 11/10/2012 Time: 4696-2952 PT Time Calculation (min): 10 min  PT Assessment / Plan / Recommendation Comments on Treatment Session  Pt functioning at mod I/supervision level. pt demos safe transfer and amb. Pt 100% compliant with back precautions. Patient no longer needs acute skilled PT. PT signing off. Please re-consult if needs are in future.    Follow Up Recommendations  No PT follow up;Supervision for mobility/OOB     Does the patient have the potential to tolerate intense rehabilitation     Barriers to Discharge        Equipment Recommendations  None recommended by PT    Recommendations for Other Services    Frequency     Plan Discharge plan remains appropriate;Frequency needs to be updated    Precautions / Restrictions Precautions Precautions: Back Precaution Comments: able to recall 3/3 precautions Restrictions Other Position/Activity Restrictions: no   Pertinent Vitals/Pain 2/10 surgical back pain    Mobility  Bed Mobility Bed Mobility: Not assessed Transfers Transfers: Sit to Stand Stand to Sit: 6: Modified independent (Device/Increase time) Ambulation/Gait Ambulation/Gait Assistance: 5: Supervision Ambulation Distance (Feet): 300 Feet Assistive device: None Gait Pattern: Step-through pattern Stairs: Yes Stairs Assistance:  (via HHA due to no handrail at home) Stair Management Technique: No rails Number of Stairs: 8  Wheelchair Mobility Wheelchair Mobility: No    Exercises     PT Diagnosis:    PT Problem List:   PT Treatment Interventions:     PT Goals Acute Rehab PT Goals PT Transfer Goal: Bed to Chair/Chair to Bed - Progress: Met PT Goal: Ambulate - Progress: Met PT Goal: Up/Down Stairs - Progress: Progressing toward goal  Visit Information  Last PT Received On: 11/10/12 Assistance Needed: +1    Subjective Data  Subjective: pt  received coming out of the bathroom   Cognition  Overall Cognitive Status: Appears within functional limits for tasks assessed/performed Arousal/Alertness: Awake/alert Orientation Level: Appears intact for tasks assessed Behavior During Session: [facility] for tasks performed    Balance     End of Session PT - End of Session Equipment Utilized During Treatment: Gait belt Activity Tolerance: Patient tolerated treatment well Patient left: in chair;with call bell/phone within reach;with family/visitor present Nurse Communication: Mobility status   GP     Marcene Brawn 11/10/2012, 12:11 PM  Lewis Shock, PT, DPT Pager #: [phone] Office #: [phone]

## 2012-12-15 ENCOUNTER — Ambulatory Visit (HOSPITAL_COMMUNITY)
Admission: RE | Admit: 2012-12-15 | Discharge: 2012-12-15 | Disposition: A | Discharge status: Home / Self Care | Payer: 59 | Source: Ambulatory Visit | Attending: Family Medicine | Admitting: Family Medicine

## 2012-12-15 DIAGNOSIS — Z1231 Encounter for screening mammogram for malignant neoplasm of breast: Secondary | ICD-10-CM | POA: Insufficient documentation

## 2013-03-26 ENCOUNTER — Ambulatory Visit (AMBULATORY_SURGERY_CENTER): Payer: 59 | Admitting: *Deleted

## 2013-03-26 VITALS — Ht 66.0 in | Wt 172.8 lb

## 2013-03-26 DIAGNOSIS — Z1211 Encounter for screening for malignant neoplasm of colon: Secondary | ICD-10-CM

## 2013-03-26 MED ORDER — MOVIPREP 100 G PO SOLR: 1.0000 | Freq: Once | ORAL | Status: DC

## 2013-03-26 NOTE — Progress Notes (Signed)
No egg or soy allergy. ewm No problems with sedation in the past. In 1997 had hysterectomy with itching with unknown etiology. Has had sedation since with no issues. ewm No home oxygen use. ewm

## 2013-04-08 ENCOUNTER — Encounter: Payer: Self-pay | Admitting: Internal Medicine

## 2013-04-08 ENCOUNTER — Ambulatory Visit (AMBULATORY_SURGERY_CENTER): Payer: 59 | Admitting: Internal Medicine

## 2013-04-08 ENCOUNTER — Other Ambulatory Visit: Payer: Self-pay | Admitting: Internal Medicine

## 2013-04-08 VITALS — BP 130/78 | HR 58 | Temp 97.6°F | Resp 14 | Ht 66.0 in | Wt 172.0 lb

## 2013-04-08 DIAGNOSIS — D126 Benign neoplasm of colon, unspecified: Secondary | ICD-10-CM

## 2013-04-08 DIAGNOSIS — Z1211 Encounter for screening for malignant neoplasm of colon: Secondary | ICD-10-CM

## 2013-04-08 LAB — GLUCOSE, CAPILLARY: Glucose-Capillary: 94 mg/dL (ref 70–99)

## 2013-04-08 MED ORDER — SODIUM CHLORIDE 0.9 % IV SOLN: 500.0000 mL | INTRAVENOUS | Status: DC

## 2013-04-08 NOTE — Progress Notes (Signed)
Patient did not experience any of the following events: a burn prior to discharge; a fall within the facility; wrong site/side/patient/procedure/implant event; or a hospital transfer or hospital admission upon discharge from the facility. (G8907) Patient did not have preoperative order for IV antibiotic SSI prophylaxis. (G8918)  

## 2013-04-08 NOTE — Op Note (Signed)
 [facility] [address] [place], [address]   COLONOSCOPY PROCEDURE REPORT  PATIENT: Vickie Lane, Vickie Lane  MR#: [id number] BIRTHDATE: [birth year] , 51  yrs. old GENDER: Female ENDOSCOPIST: Beverley Fiedler, MD REFERRED XB:JYNWGN Valentina Lucks, M.D. PROCEDURE DATE:  04/08/2013 PROCEDURE:   Colonoscopy with snare polypectomy ASA CLASS:   Class II INDICATIONS:average risk screening and first colonoscopy. MEDICATIONS: MAC sedation, administered by CRNA and propofol (Diprivan) 300mg  IV  DESCRIPTION OF PROCEDURE:   After the risks benefits and alternatives of the procedure were thoroughly explained, informed consent was obtained.  A digital rectal exam revealed no rectal mass.   The LB PFC-H190 [id number]  endoscope was introduced through the anus and advanced to the cecum, which was identified by both the appendix and ileocecal valve. No adverse events experienced. The quality of the prep was good, using MoviPrep  The instrument was then slowly withdrawn as the colon was fully examined.      COLON FINDINGS: A sessile polyp measuring 6 mm in size was found at the cecum.  A polypectomy was performed using snare cautery.  The resection was complete and the polyp tissue was completely retrieved.   The colon was otherwise normal.  There was no diverticulosis, inflammation, other polyps or cancers seen. Retroflexed views revealed no abnormalities. The time to cecum=3 minutes 50 seconds.  Withdrawal time=10 minutes 08 seconds.  The scope was withdrawn and the procedure completed.  COMPLICATIONS: There were no complications.  ENDOSCOPIC IMPRESSION: 1.   Sessile polyp measuring 6 mm in size was found at the cecum; polypectomy was performed using snare cautery 2.   The colon was otherwise normal  RECOMMENDATIONS: 1.  Hold aspirin, aspirin products, and anti-inflammatory medication for 1 week. 2.  Await pathology results 3.  If the polyp removed today is proven to be an  adenomatous (pre-cancerous) polyp, you will need a repeat colonoscopy in 5 years.  Otherwise you should continue to follow colorectal cancer screening guidelines for "routine risk" patients with colonoscopy in 10 years.  You will receive a letter within 1-2 weeks with the results of your biopsy as well as final recommendations.  Please call my office if you have not received a letter after 3 weeks.   eSigned:  Beverley Fiedler, MD 04/08/2013 11:42 AM   cc: The Patient

## 2013-04-08 NOTE — Progress Notes (Signed)
Called to room to assist during endoscopic procedure.  Patient ID and intended procedure confirmed with present staff. Received instructions for my participation in the procedure from the performing physician.  

## 2013-04-08 NOTE — Patient Instructions (Addendum)
Discharge instructions given with verbal understanding. Hold aspirin and aspirin products and anti inflammatory medications for 1 week. Handout on [polyps given. Resume previous medications. YOU HAD AN ENDOSCOPIC PROCEDURE TODAY AT THE [facility]: Refer to the procedure report that was given to you for any specific questions about what was found during the examination.  If the procedure report does not answer your questions, please call your gastroenterologist to clarify.  If you requested that your care partner not be given the details of your procedure findings, then the procedure report has been included in a sealed envelope for you to review at your convenience later.  YOU SHOULD EXPECT: Some feelings of bloating in the abdomen. Passage of more gas than usual.  Walking can help get rid of the air that was put into your GI tract during the procedure and reduce the bloating. If you had a lower endoscopy (such as a colonoscopy or flexible sigmoidoscopy) you may notice spotting of blood in your stool or on the toilet paper. If you underwent a bowel prep for your procedure, then you may not have a normal bowel movement for a few days.  DIET: Your first meal following the procedure should be a light meal and then it is ok to progress to your normal diet.  A half-sandwich or bowl of soup is an example of a good first meal.  Heavy or fried foods are harder to digest and may make you feel nauseous or bloated.  Likewise meals heavy in dairy and vegetables can cause extra gas to form and this can also increase the bloating.  Drink plenty of fluids but you should avoid alcoholic beverages for 24 hours.  ACTIVITY: Your care partner should take you home directly after the procedure.  You should plan to take it easy, moving slowly for the rest of the day.  You can resume normal activity the day after the procedure however you should NOT DRIVE or use heavy machinery for 24 hours (because of the sedation  medicines used during the test).    SYMPTOMS TO REPORT IMMEDIATELY: A gastroenterologist can be reached at any hour.  During normal business hours, 8:30 AM to 5:00 PM Monday through Friday, call [phone].  After hours and on weekends, please call the GI answering service at [phone] who will take a message and have the physician on call contact you.   Following lower endoscopy (colonoscopy or flexible sigmoidoscopy):  Excessive amounts of blood in the stool  Significant tenderness or worsening of abdominal pains  Swelling of the abdomen that is new, acute  Fever of 100F or higher  FOLLOW UP: If any biopsies were taken you will be contacted by phone or by letter within the next 1-3 weeks.  Call your gastroenterologist if you have not heard about the biopsies in 3 weeks.  Our staff will call the home number listed on your records the next business day following your procedure to check on you and address any questions or concerns that you may have at that time regarding the information given to you following your procedure. This is a courtesy call and so if there is no answer at the home number and we have not heard from you through the emergency physician on call, we will assume that you have returned to your regular daily activities without incident.  SIGNATURES/CONFIDENTIALITY: You and/or your care partner have signed paperwork which will be entered into your electronic medical record.  These signatures attest to the  fact that that the information above on your After Visit Summary has been reviewed and is understood.  Full responsibility of the confidentiality of this discharge information lies with you and/or your care-partner.

## 2013-04-09 ENCOUNTER — Telehealth: Payer: Self-pay | Admitting: *Deleted

## 2013-04-09 NOTE — Telephone Encounter (Signed)
  Follow up Call-  Call back number 04/08/2013  Post procedure Call Back phone  # [phone]  Permission to leave phone message Yes     Patient questions:  Do you have a fever, pain , or abdominal swelling? no Pain Score  0 *  Have you tolerated food without any problems? Yes   Have you been able to return to your normal activities? yes  Do you have any questions about your discharge instructions: Diet   no Medications  no Follow up visit  no  Do you have questions or concerns about your Care? no  Actions: * If pain score is 4 or above: No action needed, pain <4.

## 2013-04-15 ENCOUNTER — Encounter: Payer: Self-pay | Admitting: Internal Medicine

## 2013-09-24 ENCOUNTER — Other Ambulatory Visit: Payer: Self-pay

## 2013-11-06 ENCOUNTER — Encounter (INDEPENDENT_AMBULATORY_CARE_PROVIDER_SITE_OTHER): Payer: Commercial Managed Care - PPO | Admitting: Ophthalmology

## 2013-11-06 DIAGNOSIS — H3553 Other dystrophies primarily involving the sensory retina: Secondary | ICD-10-CM

## 2013-11-06 DIAGNOSIS — H35039 Hypertensive retinopathy, unspecified eye: Secondary | ICD-10-CM

## 2013-11-06 DIAGNOSIS — I10 Essential (primary) hypertension: Secondary | ICD-10-CM

## 2013-11-06 DIAGNOSIS — H2510 Age-related nuclear cataract, unspecified eye: Secondary | ICD-10-CM

## 2013-11-06 DIAGNOSIS — H43819 Vitreous degeneration, unspecified eye: Secondary | ICD-10-CM

## 2013-11-20 ENCOUNTER — Other Ambulatory Visit: Payer: Self-pay | Admitting: Family Medicine

## 2014-01-30 IMAGING — CR DG LUMBAR SPINE 1V
1 series · 1 of 1 positions shown · non-contrast
Comparison: MR 08/19/2012

CLINICAL DATA: lumbar fusion

LUMBAR SPINE - 1 VIEW

[view not recorded]
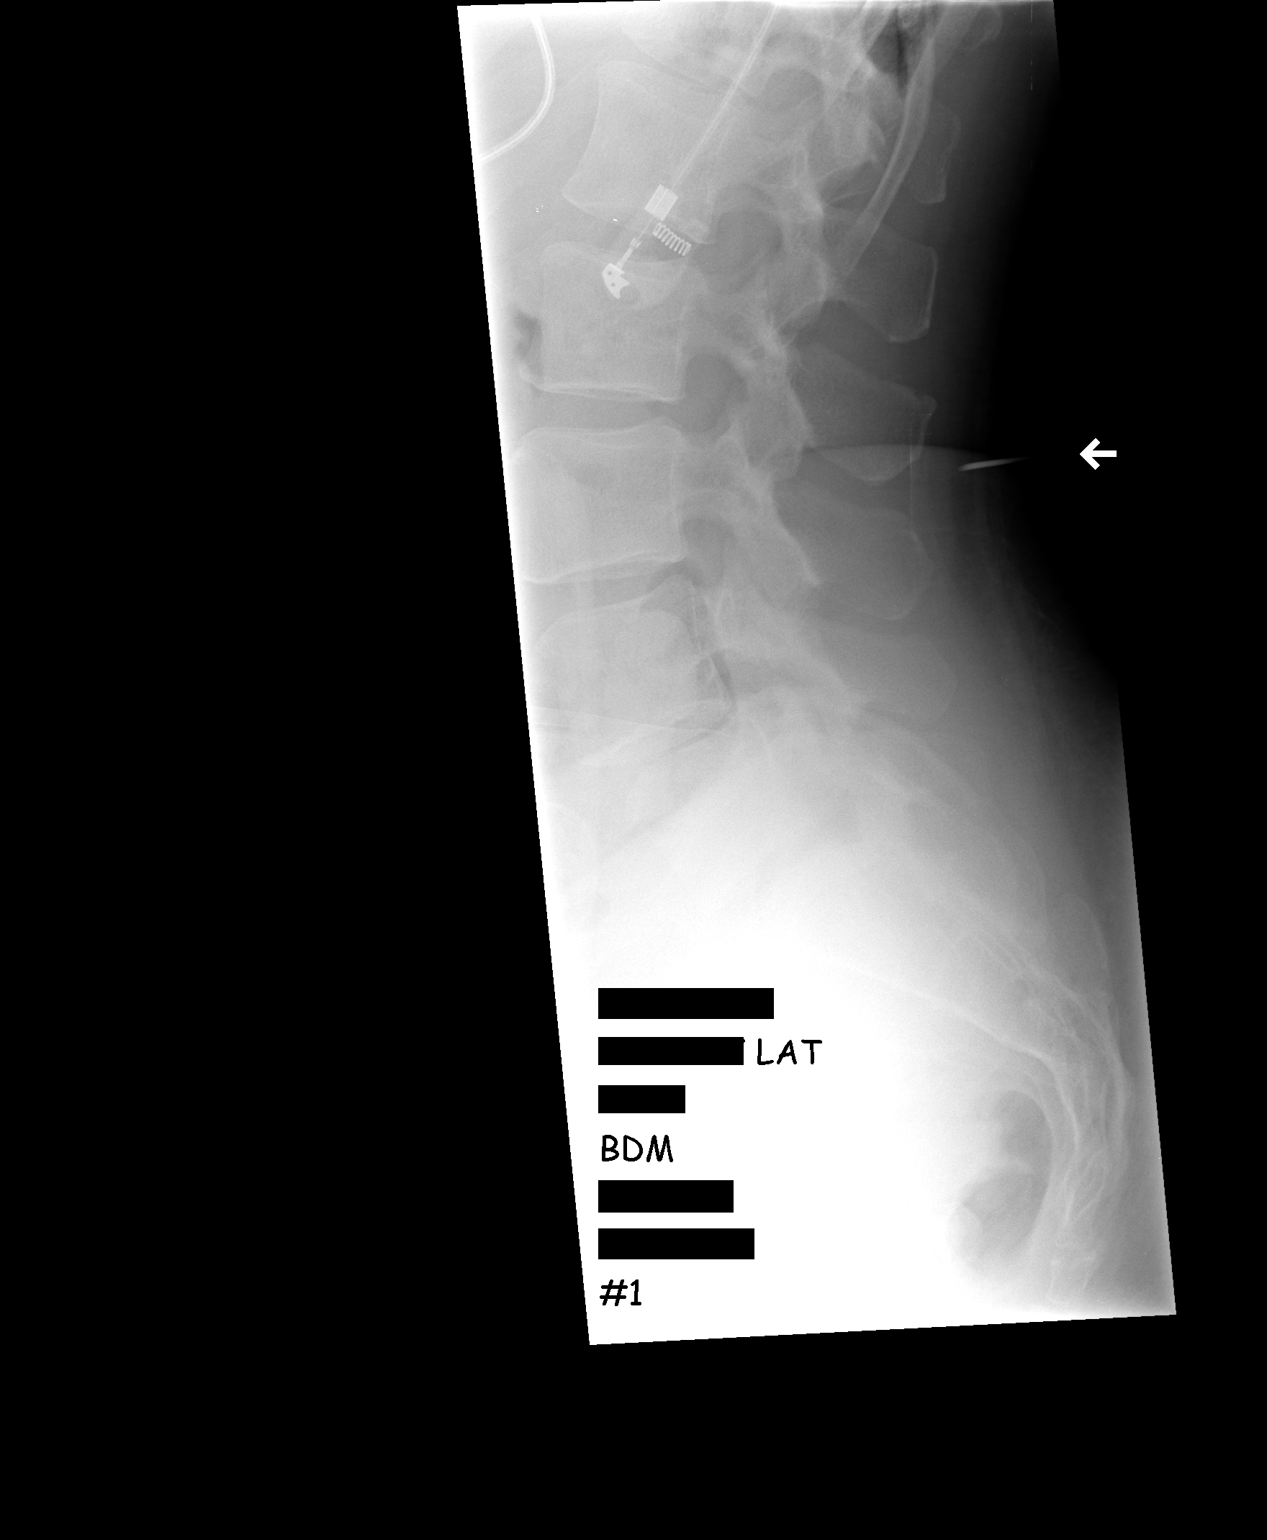

[1 of 1 positions shown; findings below may reference images not displayed]

FINDINGS: This single lateral intraoperative radiograph shows a
needle from posterior approach at the level of the L2 spinous
process.
IMPRESSION: 1.  Intraoperative localization.

## 2014-03-18 ENCOUNTER — Other Ambulatory Visit (HOSPITAL_COMMUNITY): Payer: Self-pay | Admitting: Family Medicine

## 2014-03-18 DIAGNOSIS — Z1231 Encounter for screening mammogram for malignant neoplasm of breast: Secondary | ICD-10-CM

## 2014-03-19 ENCOUNTER — Ambulatory Visit (HOSPITAL_COMMUNITY)
Admission: RE | Admit: 2014-03-19 | Discharge: 2014-03-19 | Disposition: A | Discharge status: Home / Self Care | Payer: 59 | Source: Ambulatory Visit | Attending: Family Medicine | Admitting: Family Medicine

## 2014-03-19 DIAGNOSIS — Z1231 Encounter for screening mammogram for malignant neoplasm of breast: Secondary | ICD-10-CM | POA: Insufficient documentation

## 2015-08-02 ENCOUNTER — Other Ambulatory Visit (HOSPITAL_COMMUNITY): Payer: Self-pay | Admitting: Family Medicine

## 2015-08-02 DIAGNOSIS — Z1231 Encounter for screening mammogram for malignant neoplasm of breast: Secondary | ICD-10-CM

## 2015-08-04 ENCOUNTER — Ambulatory Visit (HOSPITAL_COMMUNITY)
Admission: RE | Admit: 2015-08-04 | Discharge: 2015-08-04 | Disposition: A | Discharge status: Home / Self Care | Payer: 59 | Source: Ambulatory Visit | Attending: Family Medicine | Admitting: Family Medicine

## 2015-08-04 DIAGNOSIS — Z1231 Encounter for screening mammogram for malignant neoplasm of breast: Secondary | ICD-10-CM | POA: Diagnosis not present

## 2016-01-25 DIAGNOSIS — Z01419 Encounter for gynecological examination (general) (routine) without abnormal findings: Secondary | ICD-10-CM | POA: Diagnosis not present

## 2016-02-28 DIAGNOSIS — E7800 Pure hypercholesterolemia, unspecified: Secondary | ICD-10-CM | POA: Diagnosis not present

## 2016-02-28 DIAGNOSIS — R079 Chest pain, unspecified: Secondary | ICD-10-CM | POA: Diagnosis not present

## 2016-02-28 DIAGNOSIS — I129 Hypertensive chronic kidney disease with stage 1 through stage 4 chronic kidney disease, or unspecified chronic kidney disease: Secondary | ICD-10-CM | POA: Diagnosis not present

## 2016-02-28 DIAGNOSIS — M65311 Trigger thumb, right thumb: Secondary | ICD-10-CM | POA: Diagnosis not present

## 2016-02-28 DIAGNOSIS — E1121 Type 2 diabetes mellitus with diabetic nephropathy: Secondary | ICD-10-CM | POA: Diagnosis not present

## 2016-02-28 DIAGNOSIS — N182 Chronic kidney disease, stage 2 (mild): Secondary | ICD-10-CM | POA: Diagnosis not present

## 2016-03-22 ENCOUNTER — Other Ambulatory Visit: Payer: Self-pay | Admitting: Orthopedic Surgery

## 2016-03-27 NOTE — Progress Notes (Signed)
Patient ID: Vickie Lane, female   DOB: 1961/04/24, 55 y.o.   MRN: [id number]     Cardiology Office Note   Date:  03/28/2016   ID:  Vickie Lane, DOB 04-Jul-1961, MRN [id number]  PCP:  Osborne Casco, MD  Cardiologist:   Jenkins Rouge, MD   Chief Complaint  Patient presents with  . Establish Care    Cp with activity, some at rest, per pt      History of Present Illness: Vickie Lane is a 55 y.o. female who presents for evaluation of exertional chest pain Carries diagnosis of elevated lipids but not on statin.  HTN on multiple drugs Family history of CAD . History of MVP.  Also sickle cell trait.  Reviewed notes from Dr Laurann Montana 02/28/16  Left sided and substernal pain associated with exercise lasts a few seconds no radiation, diphoresis cough or orthopnea One rest episode that lasted longer. Still does treadmill, elliptical and spinning classes Pain started in January. In office today ECG with more inferolateral T wave changes in setting of elevated BP   She is an Therapist, sports for [employer]   Labs reviewed TC 170 Trig 43 LDL 82 HDL 80  Past Medical History  Diagnosis Date  . Hyperlipidemia     LDL  . GERD (gastroesophageal reflux disease)   . Systemic lupus erythematosus ([place]) 04/03/10    joint pain  . Hypertension 04/03/10  . Complication of anesthesia     somthing made her itch  . Mitral valve prolapse   . Constipation   . Sickle cell trait ([place])   . History of blood transfusion 1990's    "? w/one of my deliveries" (11/07/2012)  . Heart murmur     "MVP" (`11/07/2012)  . Type II diabetes mellitus [facility]) October 2012    Diet and exercise.  . Seizures ([place]) 1991    "related to pregnancy; I had HELLP" (11/07/2012)  . Arthritis     "back" (11/07/2012)  . Chronic lower back pain     Past Surgical History  Procedure Laterality Date  . Cesarean section  1991, 1993  . Abdominal hysterectomy  1997  . Wrist fracture surgery  2008    "right" (11/07/2012)  .  Posterior lumbar fusion  11/07/2012    "L4-5" (11/07/2012)  . Carpal tunnel release  2008    "right w/fracture OR" (11/07/2012)     Current Outpatient Prescriptions  Medication Sig Dispense Refill  . amLODipine (NORVASC) 10 MG tablet Take 10 mg by mouth at bedtime.     Marland Kitchen aspirin EC 81 MG tablet Take 81 mg by mouth every morning.    . benazepril-hydrochlorthiazide (LOTENSIN HCT) 20-25 MG per tablet Take 1 tablet by mouth every morning.     . Calcium-Vitamin D ([employer] CALCIUM-600/VIT D PO) Take 1 tablet by mouth daily.    . Cetirizine-Pseudoephedrine (ZYRTEC-D PO) Take 1 tablet by mouth daily as needed. For allergies    . cloNIDine (CATAPRES) 0.1 MG tablet Take 0.1 mg by mouth 2 (two) times daily.    Marland Kitchen estradiol (CLIMARA) 0.1 mg/24hr Place 1 patch onto the skin once a week. Sunday    . meloxicam (MOBIC) 7.5 MG tablet Take 7.5 mg by mouth daily as needed for pain.    Marland Kitchen MOVIPREP 100 G SOLR Take 1 kit (100 g total) by mouth once. moviprep as directed. No substitutions 1 kit 0  . Multiple Vitamin (MULTIVITAMIN WITH MINERALS) TABS Take 1 tablet by mouth daily.    Marland Kitchen  valACYclovir (VALTREX) 500 MG tablet Take 500 mg by mouth daily.      No current facility-administered medications for this visit.    Allergies:   Ciprofloxacin; Codeine; Hydrocodone; Levofloxacin; and Ultram    Social History:  The patient  reports that she has never smoked. She has never used smokeless tobacco. She reports that she drinks alcohol. She reports that she does not use illicit drugs.   Family History:  The patient's family history includes Heart attack in her father; Heart disease in her other; Hypertension in her other. There is no history of Colon cancer.    ROS:  Please see the history of present illness.   Otherwise, review of systems are positive for none.   All other systems are reviewed and negative.    PHYSICAL EXAM: VS:  BP 180/110 mmHg  Pulse 70  Ht '5\' 6"'  (1.676 m)  Wt 88.052 kg (194 lb 1.9 oz)  BMI  31.35 kg/m2  SpO2 98% , BMI Body mass index is 31.35 kg/(m^2). Affect appropriate Healthy:  appears stated age 2: normal Neck supple with no adenopathy JVP normal no bruits no thyromegaly Lungs clear with no wheezing and good diaphragmatic motion Heart:  S1/S2 no murmur, no rub, gallop or click PMI normal Abdomen: benighn, BS positve, no tenderness, no AAA no bruit.  No HSM or HJR Distal pulses intact with no bruits No edema Neuro non-focal Skin warm and dry No muscular weakness    EKG:  10/22/12  SR rate 61  Inferolateral T wave changes  02/28/16 DT tsyr 58 nonspecific ST changes  03/28/16 SR rate 60 inferolateral T wave changes    Recent Labs: No results found for requested labs within last 365 days.    Lipid Panel No results found for: CHOL, TRIG, HDL, CHOLHDL, VLDL, LDLCALC, LDLDIRECT    Wt Readings from Last 3 Encounters:  03/28/16 88.052 kg (194 lb 1.9 oz)  04/08/13 78.019 kg (172 lb)  03/26/13 78.382 kg (172 lb 12.8 oz)      Other studies Reviewed: Additional studies/ records that were reviewed today include: Notes from primary ECG labs DR Laurann Montana .    ASSESSMENT AND PLAN:  1.  Chest pain:  Family history , abnormal ECG and mostly exertional Discussed options Cannot have just routine ETT due to abnormal ECG .  Discussed stress echo , nuclear and cardiac CTA. Favor CTA with calcium score to detect all CAD and also image aorta given her significant HTN.   2. HTN:  Followed by Dr Laurann Montana very high in office today and she took her meds. Lifestyle is good with exercise and diet. F/u 24 hr ambulatory BP monitor  3. DM:  Needs BMET for CTA will order labs including fructosamine which is more accurate for DM than A1c in setting of sickle cell trait   Current medicines are reviewed at length with the patient today.  The patient does not have concerns regarding medicines.  The following changes have been made:  no change  Labs/ tests ordered today include: BP  monitor Labs and Cardiac CTA with calcium score   Orders Placed This Encounter  Procedures  . CT Coronary Morp W/Cta Cor W/Score W/Ca W/Cm &/Or Wo/Cm  . CBC with Differential/Platelet  . Comprehensive metabolic panel  . Hemoglobin A1c  . Lipid panel  . Fructosamine  . Holter monitor - 24 hour  . EKG 12-Lead     Disposition:   FU with me next available  Signed, Jenkins Rouge, MD  03/28/2016 11:26 AM    [facility] [address], [name], [place]  [address] Phone: [phone]; Fax: [phone]

## 2016-03-28 ENCOUNTER — Encounter: Payer: Self-pay | Admitting: Cardiovascular Disease

## 2016-03-28 ENCOUNTER — Ambulatory Visit (INDEPENDENT_AMBULATORY_CARE_PROVIDER_SITE_OTHER): Payer: 59 | Admitting: Cardiovascular Disease

## 2016-03-28 VITALS — BP 180/110 | HR 70 | Ht 66.0 in | Wt 194.1 lb

## 2016-03-28 DIAGNOSIS — R9431 Abnormal electrocardiogram [ECG] [EKG]: Secondary | ICD-10-CM

## 2016-03-28 DIAGNOSIS — R0789 Other chest pain: Secondary | ICD-10-CM | POA: Diagnosis not present

## 2016-03-28 DIAGNOSIS — Z7689 Persons encountering health services in other specified circumstances: Secondary | ICD-10-CM

## 2016-03-28 DIAGNOSIS — Z7189 Other specified counseling: Secondary | ICD-10-CM | POA: Diagnosis not present

## 2016-03-28 NOTE — Patient Instructions (Addendum)
Medication Instructions:  Your physician recommends that you continue on your current medications as directed. Please refer to the Current Medication list given to you today.  Labwork: Your physician recommends that you return for lab work this week for CMET, HgbA1c, CBC, Lipid, Fructosamine   Testing/Procedures: Your physician has recommended that you wear an 24 hour  BP event monitor. Event monitors are medical devices that record the heart's electrical activity. Doctors most often [place] these monitors to diagnose arrhythmias. Arrhythmias are problems with the speed or rhythm of the heartbeat. The monitor is a small, portable device. You can wear one while you do your normal daily activities. This is usually used to diagnose what is causing palpitations/syncope (passing out).  Your physician has requested that you have cardiac CT to be done next week. Cardiac computed tomography (CT) is a painless test that uses an x-ray machine to take clear, detailed pictures of your heart. For further information please visit [facility]. Please follow instruction sheet as given  Follow-Up: Your physician wants you to follow-up in: 6 months with Dr. Johnsie Cancel. You will receive a reminder letter in the mail two months in advance. If you don't receive a letter, please call our office to schedule the follow-up appointment.    If you need a refill on your cardiac medications before your next appointment, please call your pharmacy.

## 2016-03-29 ENCOUNTER — Other Ambulatory Visit (INDEPENDENT_AMBULATORY_CARE_PROVIDER_SITE_OTHER): Payer: 59 | Admitting: *Deleted

## 2016-03-29 ENCOUNTER — Other Ambulatory Visit: Payer: Self-pay | Admitting: Cardiovascular Disease

## 2016-03-29 DIAGNOSIS — R9431 Abnormal electrocardiogram [ECG] [EKG]: Secondary | ICD-10-CM | POA: Diagnosis not present

## 2016-03-29 DIAGNOSIS — Z7689 Persons encountering health services in other specified circumstances: Secondary | ICD-10-CM

## 2016-03-29 DIAGNOSIS — R0789 Other chest pain: Secondary | ICD-10-CM | POA: Diagnosis not present

## 2016-03-29 DIAGNOSIS — Z7189 Other specified counseling: Secondary | ICD-10-CM | POA: Diagnosis not present

## 2016-03-29 LAB — CBC WITH DIFFERENTIAL/PLATELET
Basophils Absolute: 0 cells/uL (ref 0–200)
Basophils Relative: 0 %
EOS PCT: 1 %
Eosinophils Absolute: 43 cells/uL (ref 15–500)
HCT: 40.5 % (ref 35.0–45.0)
HEMOGLOBIN: 13.6 g/dL (ref 11.7–15.5)
LYMPHS ABS: 2365 {cells}/uL (ref 850–3900)
LYMPHS PCT: 55 %
MCH: 27.9 pg (ref 27.0–33.0)
MCHC: 33.6 g/dL (ref 32.0–36.0)
MCV: 83 fL (ref 80.0–100.0)
MPV: 10 fL (ref 7.5–12.5)
Monocytes Absolute: 215 cells/uL (ref 200–950)
Monocytes Relative: 5 %
NEUTROS PCT: 39 %
Neutro Abs: 1677 cells/uL (ref 1500–7800)
Platelets: 272 10*3/uL (ref 140–400)
RBC: 4.88 MIL/uL (ref 3.80–5.10)
RDW: 14 % (ref 11.0–15.0)
WBC: 4.3 10*3/uL (ref 3.8–10.8)

## 2016-03-29 LAB — LIPID PANEL
Cholesterol: 170 mg/dL (ref 125–200)
HDL: 83 mg/dL (ref >=46)
LDL CALC: 79 mg/dL (ref <130)
Total CHOL/HDL Ratio: 2 Ratio (ref <=5.0)
Triglycerides: 41 mg/dL (ref <150)
VLDL: 8 mg/dL (ref <30)

## 2016-03-29 LAB — COMPREHENSIVE METABOLIC PANEL
ALT: 16 U/L (ref 6–29)
AST: 19 U/L (ref 10–35)
Albumin: 3.9 g/dL (ref 3.6–5.1)
Alkaline Phosphatase: 71 U/L (ref 33–130)
BILIRUBIN TOTAL: 0.3 mg/dL (ref 0.2–1.2)
BUN: 13 mg/dL (ref 7–25)
CHLORIDE: 105 mmol/L (ref 98–110)
CO2: 26 mmol/L (ref 20–31)
CREATININE: 0.8 mg/dL (ref 0.50–1.05)
Calcium: 9.1 mg/dL (ref 8.6–10.4)
Glucose, Bld: 100 mg/dL — ABNORMAL HIGH (ref 65–99)
Potassium: 3.8 mmol/L (ref 3.5–5.3)
SODIUM: 142 mmol/L (ref 135–146)
TOTAL PROTEIN: 7 g/dL (ref 6.1–8.1)

## 2016-03-29 LAB — HEMOGLOBIN A1C
HEMOGLOBIN A1C: 5.7 % — AB (ref <5.7)
Mean Plasma Glucose: 117 mg/dL

## 2016-03-29 NOTE — Addendum Note (Signed)
Addended by: Eulis Foster on: 03/29/2016 08:47 AM   Modules accepted: Orders

## 2016-03-29 NOTE — Addendum Note (Signed)
Addended by: Eulis Foster on: 03/29/2016 08:46 AM   Modules accepted: Orders

## 2016-04-02 DIAGNOSIS — E1121 Type 2 diabetes mellitus with diabetic nephropathy: Secondary | ICD-10-CM | POA: Diagnosis not present

## 2016-04-02 DIAGNOSIS — D573 Sickle-cell trait: Secondary | ICD-10-CM | POA: Diagnosis not present

## 2016-04-02 DIAGNOSIS — J301 Allergic rhinitis due to pollen: Secondary | ICD-10-CM | POA: Diagnosis not present

## 2016-04-02 DIAGNOSIS — K219 Gastro-esophageal reflux disease without esophagitis: Secondary | ICD-10-CM | POA: Diagnosis not present

## 2016-04-02 DIAGNOSIS — I129 Hypertensive chronic kidney disease with stage 1 through stage 4 chronic kidney disease, or unspecified chronic kidney disease: Secondary | ICD-10-CM | POA: Diagnosis not present

## 2016-04-02 DIAGNOSIS — M329 Systemic lupus erythematosus, unspecified: Secondary | ICD-10-CM | POA: Diagnosis not present

## 2016-04-02 DIAGNOSIS — Z0000 Encounter for general adult medical examination without abnormal findings: Secondary | ICD-10-CM | POA: Diagnosis not present

## 2016-04-02 DIAGNOSIS — N182 Chronic kidney disease, stage 2 (mild): Secondary | ICD-10-CM | POA: Diagnosis not present

## 2016-04-02 DIAGNOSIS — E7800 Pure hypercholesterolemia, unspecified: Secondary | ICD-10-CM | POA: Diagnosis not present

## 2016-04-03 ENCOUNTER — Other Ambulatory Visit: Payer: Self-pay | Admitting: Cardiovascular Disease

## 2016-04-03 ENCOUNTER — Encounter: Payer: Self-pay | Admitting: *Deleted

## 2016-04-03 ENCOUNTER — Ambulatory Visit (INDEPENDENT_AMBULATORY_CARE_PROVIDER_SITE_OTHER): Payer: 59

## 2016-04-03 DIAGNOSIS — I10 Essential (primary) hypertension: Secondary | ICD-10-CM | POA: Diagnosis not present

## 2016-04-03 DIAGNOSIS — R0789 Other chest pain: Secondary | ICD-10-CM

## 2016-04-03 DIAGNOSIS — R9431 Abnormal electrocardiogram [ECG] [EKG]: Secondary | ICD-10-CM

## 2016-04-03 NOTE — Progress Notes (Signed)
Patient ID: Vickie Lane, female   DOB: 06-03-1961, 55 y.o.   MRN: [id number] 24 hour ambulatory blood pressure monitor applied to patient.

## 2016-04-04 ENCOUNTER — Encounter: Payer: Self-pay | Admitting: Cardiovascular Disease

## 2016-04-04 ENCOUNTER — Ambulatory Visit (HOSPITAL_COMMUNITY)
Admission: RE | Admit: 2016-04-04 | Discharge: 2016-04-04 | Disposition: A | Discharge status: Home / Self Care | Payer: 59 | Source: Ambulatory Visit | Attending: Cardiovascular Disease | Admitting: Cardiovascular Disease

## 2016-04-04 ENCOUNTER — Encounter (HOSPITAL_COMMUNITY): Payer: Self-pay

## 2016-04-04 DIAGNOSIS — Z7189 Other specified counseling: Secondary | ICD-10-CM | POA: Insufficient documentation

## 2016-04-04 DIAGNOSIS — R0789 Other chest pain: Secondary | ICD-10-CM | POA: Insufficient documentation

## 2016-04-04 DIAGNOSIS — R079 Chest pain, unspecified: Secondary | ICD-10-CM | POA: Diagnosis not present

## 2016-04-04 DIAGNOSIS — Z7689 Persons encountering health services in other specified circumstances: Secondary | ICD-10-CM

## 2016-04-04 DIAGNOSIS — R9431 Abnormal electrocardiogram [ECG] [EKG]: Secondary | ICD-10-CM | POA: Insufficient documentation

## 2016-04-04 MED ORDER — NITROGLYCERIN 0.4 MG SL SUBL
0.8000 mg | SUBLINGUAL_TABLET | Freq: Once | SUBLINGUAL | Status: AC
  Administered 2016-04-04: 0.8 mg via SUBLINGUAL
  Filled 2016-04-04: qty 25

## 2016-04-04 MED ORDER — IOPAMIDOL (ISOVUE-370) INJECTION 76%
INTRAVENOUS | Status: AC
  Administered 2016-04-04: 80 mL
  Filled 2016-04-04: qty 100

## 2016-04-04 MED ORDER — NITROGLYCERIN 0.4 MG SL SUBL
SUBLINGUAL_TABLET | SUBLINGUAL | Status: AC
  Filled 2016-04-04: qty 2

## 2016-04-04 MED ORDER — METOPROLOL TARTRATE 5 MG/5ML IV SOLN
INTRAVENOUS | Status: AC
  Filled 2016-04-04: qty 5

## 2016-04-04 MED ORDER — METOPROLOL TARTRATE 5 MG/5ML IV SOLN
5.0000 mg | Freq: Once | INTRAVENOUS | Status: AC
  Administered 2016-04-04: 5 mg via INTRAVENOUS
  Filled 2016-04-04: qty 5

## 2016-04-05 ENCOUNTER — Telehealth: Payer: Self-pay

## 2016-04-05 NOTE — Telephone Encounter (Signed)
-----   Message from Josue Hector, MD sent at 04/04/2016 12:28 PM EDT ----- No obstructive CAD calcium score high would start lipitor 5 mg take M/W/Friday And repeat LFTls and lipid in 3 months

## 2016-04-05 NOTE — Telephone Encounter (Signed)
Notes Recorded by Michaelyn Barter, RN on 04/05/2016 at 9:47 AM Patient is aware of results. Patient stated in a MyChart message that she has been taking Simvastatin 20 mg HS for a year. Will forward to Dr. Johnsie Cancel for advisement. ------

## 2016-04-05 NOTE — Telephone Encounter (Signed)
That's fine

## 2016-04-06 NOTE — Telephone Encounter (Signed)
Left detailed message on patient's phone number provided. If patient has any question, left return number to call and ask for nurse.

## 2016-04-09 LAB — FRUCTOSAMINE: Fructosamine: 251 umol/L (ref 190–270)

## 2016-04-23 DIAGNOSIS — L989 Disorder of the skin and subcutaneous tissue, unspecified: Secondary | ICD-10-CM | POA: Diagnosis not present

## 2016-04-23 DIAGNOSIS — L910 Hypertrophic scar: Secondary | ICD-10-CM | POA: Diagnosis not present

## 2016-04-27 ENCOUNTER — Encounter (HOSPITAL_BASED_OUTPATIENT_CLINIC_OR_DEPARTMENT_OTHER): Payer: Self-pay | Admitting: *Deleted

## 2016-05-01 ENCOUNTER — Encounter (HOSPITAL_BASED_OUTPATIENT_CLINIC_OR_DEPARTMENT_OTHER)
Admission: RE | Admit: 2016-05-01 | Discharge: 2016-05-01 | Disposition: A | Discharge status: Home / Self Care | Payer: 59 | Source: Ambulatory Visit | Attending: Orthopedic Surgery | Admitting: Orthopedic Surgery

## 2016-05-01 LAB — BASIC METABOLIC PANEL
Anion gap: 6 (ref 5–15)
BUN: 9 mg/dL (ref 6–20)
CHLORIDE: 106 mmol/L (ref 101–111)
CO2: 26 mmol/L (ref 22–32)
CREATININE: 0.78 mg/dL (ref 0.44–1.00)
Calcium: 9.9 mg/dL (ref 8.9–10.3)
Glucose, Bld: 97 mg/dL (ref 65–99)
Potassium: 4.1 mmol/L (ref 3.5–5.1)
SODIUM: 138 mmol/L (ref 135–145)

## 2016-05-04 ENCOUNTER — Ambulatory Visit (HOSPITAL_BASED_OUTPATIENT_CLINIC_OR_DEPARTMENT_OTHER)
Admission: RE | Admit: 2016-05-04 | Discharge: 2016-05-04 | Disposition: A | Discharge status: Home / Self Care | Payer: 59 | Source: Ambulatory Visit | Attending: Orthopedic Surgery | Admitting: Orthopedic Surgery

## 2016-05-04 ENCOUNTER — Encounter (HOSPITAL_BASED_OUTPATIENT_CLINIC_OR_DEPARTMENT_OTHER): Payer: Self-pay | Admitting: *Deleted

## 2016-05-04 ENCOUNTER — Ambulatory Visit (HOSPITAL_BASED_OUTPATIENT_CLINIC_OR_DEPARTMENT_OTHER): Payer: 59 | Admitting: Anesthesiology

## 2016-05-04 ENCOUNTER — Encounter (HOSPITAL_BASED_OUTPATIENT_CLINIC_OR_DEPARTMENT_OTHER)
Admission: RE | Disposition: A | Discharge status: Home / Self Care | Payer: Self-pay | Source: Ambulatory Visit | Attending: Orthopedic Surgery

## 2016-05-04 DIAGNOSIS — I341 Nonrheumatic mitral (valve) prolapse: Secondary | ICD-10-CM | POA: Insufficient documentation

## 2016-05-04 DIAGNOSIS — K219 Gastro-esophageal reflux disease without esophagitis: Secondary | ICD-10-CM | POA: Insufficient documentation

## 2016-05-04 DIAGNOSIS — Z7982 Long term (current) use of aspirin: Secondary | ICD-10-CM | POA: Insufficient documentation

## 2016-05-04 DIAGNOSIS — D573 Sickle-cell trait: Secondary | ICD-10-CM | POA: Diagnosis not present

## 2016-05-04 DIAGNOSIS — R569 Unspecified convulsions: Secondary | ICD-10-CM | POA: Diagnosis not present

## 2016-05-04 DIAGNOSIS — E785 Hyperlipidemia, unspecified: Secondary | ICD-10-CM | POA: Insufficient documentation

## 2016-05-04 DIAGNOSIS — E119 Type 2 diabetes mellitus without complications: Secondary | ICD-10-CM | POA: Insufficient documentation

## 2016-05-04 DIAGNOSIS — M65311 Trigger thumb, right thumb: Secondary | ICD-10-CM | POA: Insufficient documentation

## 2016-05-04 DIAGNOSIS — M1990 Unspecified osteoarthritis, unspecified site: Secondary | ICD-10-CM | POA: Insufficient documentation

## 2016-05-04 HISTORY — PX: TRIGGER FINGER RELEASE: SHX641

## 2016-05-04 LAB — GLUCOSE, CAPILLARY: Glucose-Capillary: 97 mg/dL (ref 65–99)

## 2016-05-04 SURGERY — MINOR RELEASE TRIGGER FINGER/A-1 PULLEY
Anesthesia: LOCAL | Site: Thumb | Laterality: Right

## 2016-05-04 MED ORDER — BUPIVACAINE-EPINEPHRINE (PF) 0.5% -1:200000 IJ SOLN
INTRAMUSCULAR | Status: AC
  Filled 2016-05-04: qty 30

## 2016-05-04 MED ORDER — LACTATED RINGERS IV SOLN
INTRAVENOUS | Status: DC
  Administered 2016-05-04: 07:00:00 via INTRAVENOUS

## 2016-05-04 MED ORDER — LIDOCAINE-EPINEPHRINE 1 %-1:100000 IJ SOLN
INTRAMUSCULAR | Status: AC
  Filled 2016-05-04: qty 1

## 2016-05-04 MED ORDER — BUPIVACAINE HCL (PF) 0.25 % IJ SOLN
INTRAMUSCULAR | Status: DC | PRN
  Administered 2016-05-04: 5 mL

## 2016-05-04 MED ORDER — MIDAZOLAM HCL 2 MG/2ML IJ SOLN
INTRAMUSCULAR | Status: AC
  Filled 2016-05-04: qty 2

## 2016-05-04 MED ORDER — PROPOFOL 10 MG/ML IV BOLUS
INTRAVENOUS | Status: AC
  Filled 2016-05-04: qty 40

## 2016-05-04 MED ORDER — MIDAZOLAM HCL 2 MG/2ML IJ SOLN: 1.0000 mg | INTRAMUSCULAR | Status: DC | PRN

## 2016-05-04 MED ORDER — FENTANYL CITRATE (PF) 100 MCG/2ML IJ SOLN: 50.0000 ug | INTRAMUSCULAR | Status: DC | PRN

## 2016-05-04 MED ORDER — CHLORHEXIDINE GLUCONATE 4 % EX LIQD: 60.0000 mL | Freq: Once | CUTANEOUS | Status: DC

## 2016-05-04 MED ORDER — LIDOCAINE-EPINEPHRINE 1 %-1:100000 IJ SOLN
INTRAMUSCULAR | Status: DC | PRN
  Administered 2016-05-04: 5 mL

## 2016-05-04 MED ORDER — LIDOCAINE-EPINEPHRINE (PF) 1 %-1:200000 IJ SOLN
INTRAMUSCULAR | Status: AC
  Filled 2016-05-04: qty 30

## 2016-05-04 MED ORDER — GLYCOPYRROLATE 0.2 MG/ML IJ SOLN: 0.2000 mg | Freq: Once | INTRAMUSCULAR | Status: DC | PRN

## 2016-05-04 MED ORDER — SCOPOLAMINE 1 MG/3DAYS TD PT72: 1.0000 | MEDICATED_PATCH | Freq: Once | TRANSDERMAL | Status: DC | PRN

## 2016-05-04 MED ORDER — SODIUM BICARBONATE 4 % IV SOLN
INTRAVENOUS | Status: AC
  Filled 2016-05-04: qty 5

## 2016-05-04 MED ORDER — FENTANYL CITRATE (PF) 100 MCG/2ML IJ SOLN
INTRAMUSCULAR | Status: AC
  Filled 2016-05-04: qty 2

## 2016-05-04 MED ORDER — CEFAZOLIN SODIUM-DEXTROSE 2-4 GM/100ML-% IV SOLN
INTRAVENOUS | Status: AC
  Filled 2016-05-04: qty 100

## 2016-05-04 MED ORDER — CEFAZOLIN SODIUM-DEXTROSE 2-4 GM/100ML-% IV SOLN: 2.0000 g | INTRAVENOUS | Status: DC

## 2016-05-04 MED ORDER — BUPIVACAINE-EPINEPHRINE (PF) 0.25% -1:200000 IJ SOLN
INTRAMUSCULAR | Status: AC
  Filled 2016-05-04: qty 30

## 2016-05-04 MED ORDER — LIDOCAINE HCL 2 % IJ SOLN
INTRAMUSCULAR | Status: AC
  Filled 2016-05-04: qty 20

## 2016-05-04 MED ORDER — BUPIVACAINE HCL (PF) 0.25 % IJ SOLN
INTRAMUSCULAR | Status: AC
  Filled 2016-05-04: qty 30

## 2016-05-04 MED ORDER — SODIUM BICARBONATE 4 % IV SOLN
INTRAVENOUS | Status: DC | PRN
  Administered 2016-05-04: 1 mL via INTRAVENOUS

## 2016-05-04 SURGICAL SUPPLY — 47 items
APL SKNCLS STERI-STRIP NONHPOA (GAUZE/BANDAGES/DRESSINGS)
BANDAGE ACE 4X5 VEL STRL LF (GAUZE/BANDAGES/DRESSINGS) IMPLANT
BENZOIN TINCTURE PRP APPL 2/3 (GAUZE/BANDAGES/DRESSINGS) IMPLANT
BLADE SURG 15 STRL LF DISP TIS (BLADE) ×1 IMPLANT
BLADE SURG 15 STRL SS (BLADE) ×2
BNDG CMPR 9X4 STRL LF SNTH (GAUZE/BANDAGES/DRESSINGS)
BNDG ESMARK 4X9 LF (GAUZE/BANDAGES/DRESSINGS) IMPLANT
BNDG GAUZE ELAST 4 BULKY (GAUZE/BANDAGES/DRESSINGS) IMPLANT
CORDS BIPOLAR (ELECTRODE) ×2 IMPLANT
COVER BACK TABLE 60X90IN (DRAPES) ×2 IMPLANT
CUFF TOURNIQUET SINGLE 18IN (TOURNIQUET CUFF) IMPLANT
DRAPE EXTREMITY T 121X128X90 (DRAPE) ×2 IMPLANT
DRAPE SURG 17X23 STRL (DRAPES) ×2 IMPLANT
DURAPREP 26ML APPLICATOR (WOUND CARE) ×2 IMPLANT
GAUZE XEROFORM 1X8 LF (GAUZE/BANDAGES/DRESSINGS) IMPLANT
GLOVE BIOGEL PI IND STRL 7.0 (GLOVE) ×1 IMPLANT
GLOVE BIOGEL PI IND STRL 7.5 (GLOVE) ×1 IMPLANT
GLOVE BIOGEL PI INDICATOR 7.0 (GLOVE) ×1
GLOVE BIOGEL PI INDICATOR 7.5 (GLOVE) ×1
GLOVE ECLIPSE 6.5 STRL STRAW (GLOVE) ×2 IMPLANT
GLOVE SURG SS PI 7.5 STRL IVOR (GLOVE) ×2 IMPLANT
GLOVE SURG SYN 8.0 (GLOVE) ×4 IMPLANT
GOWN STRL REUS W/ TWL LRG LVL3 (GOWN DISPOSABLE) ×2 IMPLANT
GOWN STRL REUS W/TWL LRG LVL3 (GOWN DISPOSABLE) ×4
GOWN STRL REUS W/TWL XL LVL3 (GOWN DISPOSABLE) ×4 IMPLANT
NEEDLE HYPO 25X1 1.5 SAFETY (NEEDLE) IMPLANT
NS IRRIG 1000ML POUR BTL (IV SOLUTION) ×2 IMPLANT
PACK BASIN DAY SURGERY FS (CUSTOM PROCEDURE TRAY) ×2 IMPLANT
PAD CAST 3X4 CTTN HI CHSV (CAST SUPPLIES) IMPLANT
PADDING CAST ABS 4INX4YD NS (CAST SUPPLIES) ×1
PADDING CAST ABS COTTON 4X4 ST (CAST SUPPLIES) ×1 IMPLANT
PADDING CAST COTTON 3X4 STRL (CAST SUPPLIES)
SHEET MEDIUM DRAPE 40X70 STRL (DRAPES) ×2 IMPLANT
SPONGE GAUZE 4X4 12PLY STER LF (GAUZE/BANDAGES/DRESSINGS) ×4 IMPLANT
STOCKINETTE 4X48 STRL (DRAPES) ×2 IMPLANT
STRIP CLOSURE SKIN 1/2X4 (GAUZE/BANDAGES/DRESSINGS) IMPLANT
SUT ETHILON 4 0 PS 2 18 (SUTURE) IMPLANT
SUT ETHILON 5 0 PS 2 18 (SUTURE) IMPLANT
SUT PROLENE 3 0 PS 2 (SUTURE) IMPLANT
SUT VIC AB 4-0 P-3 18XBRD (SUTURE) IMPLANT
SUT VIC AB 4-0 P3 18 (SUTURE)
SUT VICRYL RAPIDE 4-0 (SUTURE) IMPLANT
SUT VICRYL RAPIDE 4/0 PS 2 (SUTURE) IMPLANT
SYR BULB 3OZ (MISCELLANEOUS) ×2 IMPLANT
SYRINGE 10CC LL (SYRINGE) IMPLANT
TOWEL OR 17X24 6PK STRL BLUE (TOWEL DISPOSABLE) ×2 IMPLANT
UNDERPAD 30X30 (UNDERPADS AND DIAPERS) ×2 IMPLANT

## 2016-05-04 NOTE — Anesthesia Preprocedure Evaluation (Signed)
Anesthesia Evaluation  Patient identified by MRN, date of birth, ID band Patient awake    Reviewed: Allergy & Precautions, H&P , NPO status , Patient's Chart, lab work & pertinent test results  Airway Mallampati: II   Neck ROM: full    Dental   Pulmonary    breath sounds clear to auscultation       Cardiovascular hypertension, + Valvular Problems/Murmurs MVP  Rhythm:regular Rate:Normal     Neuro/Psych Seizures -,     GI/Hepatic GERD  ,  Endo/Other  diabetes, Type 2obese  Renal/GU      Musculoskeletal  (+) Arthritis ,   Abdominal   Peds  Hematology  (+) Sickle cell trait ,   Anesthesia Other Findings   Reproductive/Obstetrics                             Anesthesia Physical Anesthesia Plan  ASA: II  Anesthesia Plan: MAC   Post-op Pain Management:    Induction: Intravenous  Airway Management Planned: Simple Face Mask  Additional Equipment:   Intra-op Plan:   Post-operative Plan:   Informed Consent: I have reviewed the patients History and Physical, chart, labs and discussed the procedure including the risks, benefits and alternatives for the proposed anesthesia with the patient or authorized representative who has indicated his/her understanding and acceptance.     Plan Discussed with: CRNA, Anesthesiologist and Surgeon  Anesthesia Plan Comments:         Anesthesia Quick Evaluation

## 2016-05-04 NOTE — Op Note (Signed)
Vickie Lane, VALVANO NO.:  [id number]  MEDICAL RECORD NO.:  [id number]  LOCATION:                                 FACILITY:  PHYSICIAN:  Sheral Apley. Jameil Whitmoyer, M.D.DATE OF BIRTH:  30-Jul-1961  DATE OF PROCEDURE:  05/04/2016 DATE OF DISCHARGE:                              OPERATIVE REPORT   PREOPERATIVE DIAGNOSIS:  Chronic right thumb triggering.  POSTOPERATIVE DIAGNOSIS:  Chronic right thumb triggering.  PROCEDURE:  Right thumb A1 pulley release.  SURGEON:  Sheral Apley. Burney Gauze, M.D.  ASSISTANT:  None.  ANESTHESIA:  Local.  TOURNIQUET:  No tourniquet.  COMPLICATIONS:  No complications.  DRAINS:  No drains.  PROCEDURE IN DETAIL:  25 minutes prior being taken to the operating suite, I injected 10 mL 1% lidocaine with epinephrine 1:100,000 and 1 mL of bicarb solution in and around the A1 pulley of the right thumb. After 25 minutes, the patient was taken to the operating suite, prepped and draped in sterile fashion.  Transverse incision was made over the MP flexion crease.  Dissection was carried down through the skin, subcutaneous tissues.  FPL was identified.  Neurovascular structures were identified and retracted.  FPL and A1 pulley were identified.  The A1 pulley was split with a 15 blade.  FPL was lysed of all adhesions. The patient had active flexion and extension with no clicking or popping on the operating room table.  We thoroughly irrigated and closed with 2 4-0 nylon horizontal mattress sutures. Xeroform, 4x4s, and Coban wrap was applied.  The patient tolerated the procedure well and went to recovery room in stable fashion.     Sheral Apley Burney Gauze, M.D.   ______________________________ Sheral Apley. Burney Gauze, M.D.    MAW/MEDQ  D:  05/04/2016  T:  05/04/2016  Job:  [id number]

## 2016-05-04 NOTE — Op Note (Signed)
See note [phone]

## 2016-05-04 NOTE — H&P (Signed)
Vickie Lane is an 55 y.o. female.   Chief Complaint: r thumb triggering HPI: as above with h/o chronic STS rightthumb  Past Medical History  Diagnosis Date  . Hyperlipidemia     LDL  . GERD (gastroesophageal reflux disease)   . Systemic lupus erythematosus ([place]) 04/03/10    joint pain  . Hypertension 04/03/10  . Complication of anesthesia     somthing made her itch  . Mitral valve prolapse   . Constipation   . Sickle cell trait ([place])   . History of blood transfusion 1990's    "? w/one of my deliveries" (11/07/2012)  . Heart murmur     "MVP" (`11/07/2012)  . Type II diabetes mellitus [facility]) October 2012    Diet and exercise.  . Seizures ([place]) 1991    "related to pregnancy; I had HELLP" (11/07/2012)  . Arthritis     "back" (11/07/2012)  . Chronic lower back pain     Past Surgical History  Procedure Laterality Date  . Cesarean section  1991, 1993  . Abdominal hysterectomy  1997  . Wrist fracture surgery  2008    "right" (11/07/2012)  . Posterior lumbar fusion  11/07/2012    "L4-5" (11/07/2012)  . Carpal tunnel release  2008    "right w/fracture OR" (11/07/2012)    Family History  Problem Relation Age of Onset  . Heart attack Father   . Heart disease Other   . Hypertension Other   . Colon cancer Neg Hx    Social History:  reports that she has never smoked. She has never used smokeless tobacco. She reports that she drinks alcohol. She reports that she does not use illicit drugs.  Allergies:  Allergies  Allergen Reactions  . Ciprofloxacin Anaphylaxis  . Codeine Nausea Only and Other (See Comments)    "dry heaves" (11/07/2012)  . Hydrocodone Nausea Only and Other (See Comments)    "dry heaves" (11/07/2012)  . Levofloxacin Anaphylaxis  . Ultram [Tramadol Hcl] Nausea Only and Other (See Comments)    "dry heaves" (11/07/2012)    Medications Prior to Admission  Medication Sig Dispense Refill  . amLODipine (NORVASC) 10 MG tablet Take 10 mg by mouth at bedtime.      Marland Kitchen aspirin EC 81 MG tablet Take 81 mg by mouth every morning.    . benazepril-hydrochlorthiazide (LOTENSIN HCT) 20-25 MG per tablet Take 1 tablet by mouth every morning.     . Calcium-Vitamin D ([employer] CALCIUM-600/VIT D PO) Take 1 tablet by mouth daily.    . Cetirizine-Pseudoephedrine (ZYRTEC-D PO) Take 1 tablet by mouth daily as needed. For allergies    . cloNIDine (CATAPRES) 0.1 MG tablet Take 0.1 mg by mouth 2 (two) times daily.    Marland Kitchen estradiol (CLIMARA) 0.1 mg/24hr Place 1 patch onto the skin once a week. Sunday    . meloxicam (MOBIC) 7.5 MG tablet Take 7.5 mg by mouth daily as needed for pain.    . Multiple Vitamin (MULTIVITAMIN WITH MINERALS) TABS Take 1 tablet by mouth daily.    . simvastatin (ZOCOR) 20 MG tablet Take 20 mg by mouth daily at 6 PM.    . valACYclovir (VALTREX) 500 MG tablet Take 500 mg by mouth daily.       Results for orders placed or performed during the hospital encounter of 05/04/16 (from the past 48 hour(s))  Glucose, capillary     Status: None   Collection Time: 05/04/16  6:49 AM  Result Value Ref Range  Glucose-Capillary 97 65 - 99 mg/dL   No results found.  Review of Systems  All other systems reviewed and are negative.   Blood pressure 147/87, pulse 71, temperature 97.9 F (36.6 C), temperature source Oral, resp. rate 16, height 5\' 6"  (1.676 m), weight 88.089 kg (194 lb 3.2 oz), SpO2 100 %. Physical Exam  Constitutional: She is oriented to person, place, and time. She appears well-developed and well-nourished.  HENT:  Head: Normocephalic.  Neck: Normal range of motion.  Cardiovascular: Normal rate.   Respiratory: Effort normal.  Musculoskeletal:       Right hand: She exhibits tenderness and swelling.  Chronic r thumb triggering with pain and swelling at mcp volarly  Neurological: She is alert and oriented to person, place, and time.  Skin: Skin is warm.  Psychiatric: She has a normal mood and affect. Her behavior is normal. Judgment and thought  content normal.     Assessment/Plan As above  Plan A-1 pulley release  Charlotte Crumb A, MD 05/04/2016, 7:39 AM

## 2016-05-08 ENCOUNTER — Encounter (HOSPITAL_BASED_OUTPATIENT_CLINIC_OR_DEPARTMENT_OTHER): Payer: Self-pay | Admitting: Orthopedic Surgery

## 2016-06-04 DIAGNOSIS — L910 Hypertrophic scar: Secondary | ICD-10-CM | POA: Diagnosis not present

## 2016-07-02 DIAGNOSIS — L910 Hypertrophic scar: Secondary | ICD-10-CM | POA: Diagnosis not present

## 2016-07-22 DIAGNOSIS — S63602A Unspecified sprain of left thumb, initial encounter: Secondary | ICD-10-CM | POA: Diagnosis not present

## 2016-07-22 DIAGNOSIS — S0083XA Contusion of other part of head, initial encounter: Secondary | ICD-10-CM | POA: Diagnosis not present

## 2016-07-22 DIAGNOSIS — Y9355 Activity, bike riding: Secondary | ICD-10-CM | POA: Diagnosis not present

## 2016-07-22 DIAGNOSIS — S63502A Unspecified sprain of left wrist, initial encounter: Secondary | ICD-10-CM | POA: Diagnosis not present

## 2016-07-22 DIAGNOSIS — M79642 Pain in left hand: Secondary | ICD-10-CM | POA: Diagnosis not present

## 2016-08-14 DIAGNOSIS — S62522A Displaced fracture of distal phalanx of left thumb, initial encounter for closed fracture: Secondary | ICD-10-CM | POA: Diagnosis not present

## 2016-08-14 DIAGNOSIS — M79645 Pain in left finger(s): Secondary | ICD-10-CM | POA: Diagnosis not present

## 2016-09-04 DIAGNOSIS — S62522A Displaced fracture of distal phalanx of left thumb, initial encounter for closed fracture: Secondary | ICD-10-CM | POA: Diagnosis not present

## 2016-09-04 DIAGNOSIS — M79645 Pain in left finger(s): Secondary | ICD-10-CM | POA: Diagnosis not present

## 2016-09-04 DIAGNOSIS — M65311 Trigger thumb, right thumb: Secondary | ICD-10-CM | POA: Diagnosis not present

## 2016-10-08 ENCOUNTER — Other Ambulatory Visit: Payer: Self-pay | Admitting: Family Medicine

## 2016-10-08 DIAGNOSIS — E7800 Pure hypercholesterolemia, unspecified: Secondary | ICD-10-CM | POA: Diagnosis not present

## 2016-10-08 DIAGNOSIS — D573 Sickle-cell trait: Secondary | ICD-10-CM | POA: Diagnosis not present

## 2016-10-08 DIAGNOSIS — Z1231 Encounter for screening mammogram for malignant neoplasm of breast: Secondary | ICD-10-CM

## 2016-10-08 DIAGNOSIS — M329 Systemic lupus erythematosus, unspecified: Secondary | ICD-10-CM | POA: Diagnosis not present

## 2016-10-08 DIAGNOSIS — E1121 Type 2 diabetes mellitus with diabetic nephropathy: Secondary | ICD-10-CM | POA: Diagnosis not present

## 2016-10-08 DIAGNOSIS — I129 Hypertensive chronic kidney disease with stage 1 through stage 4 chronic kidney disease, or unspecified chronic kidney disease: Secondary | ICD-10-CM | POA: Diagnosis not present

## 2016-10-08 DIAGNOSIS — N182 Chronic kidney disease, stage 2 (mild): Secondary | ICD-10-CM | POA: Diagnosis not present

## 2016-10-09 DIAGNOSIS — M79645 Pain in left finger(s): Secondary | ICD-10-CM | POA: Diagnosis not present

## 2016-10-09 DIAGNOSIS — M65311 Trigger thumb, right thumb: Secondary | ICD-10-CM | POA: Diagnosis not present

## 2016-10-09 DIAGNOSIS — S62522A Displaced fracture of distal phalanx of left thumb, initial encounter for closed fracture: Secondary | ICD-10-CM | POA: Diagnosis not present

## 2016-10-10 ENCOUNTER — Ambulatory Visit
Admission: RE | Admit: 2016-10-10 | Discharge: 2016-10-10 | Disposition: A | Discharge status: Home / Self Care | Payer: 59 | Source: Ambulatory Visit | Attending: Family Medicine | Admitting: Family Medicine

## 2016-10-10 DIAGNOSIS — Z1231 Encounter for screening mammogram for malignant neoplasm of breast: Secondary | ICD-10-CM

## 2016-11-16 DIAGNOSIS — H52223 Regular astigmatism, bilateral: Secondary | ICD-10-CM | POA: Diagnosis not present

## 2017-01-04 ENCOUNTER — Encounter: Payer: Self-pay | Admitting: Cardiovascular Disease

## 2017-01-04 NOTE — Progress Notes (Deleted)
Patient ID: Vickie Lane, female   DOB: Sep 30, 1961, 56 y.o.   MRN: [id number]     Cardiology Office Note   Date:  01/04/2017   ID:  Vickie Lane, Alferd Apa 1961/08/03, MRN [id number]  PCP:  Osborne Casco, MD  Cardiologist:   Jenkins Rouge, MD   No chief complaint on file.     History of Present Illness: Vickie Lane is a 56 y.o. female who presents for evaluation of exertional chest pain Carries diagnosis of elevated lipids but not on statin.  HTN on multiple drugs Family history of CAD . History of MVP.  Also sickle cell trait.  Reviewed notes from Dr Laurann Montana 02/28/16  Left sided and substernal pain associated with exercise lasts a few seconds no radiation, diphoresis cough or orthopnea One rest episode that lasted longer. Still does treadmill, elliptical and spinning classes Pain started in January. 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 Arthritis    "back" (11/07/2012)  . Chronic lower back pain   . Complication of anesthesia    somthing made her itch  . Constipation   . GERD (gastroesophageal reflux disease)   . Heart murmur    "MVP" (`11/07/2012)  . History of blood transfusion 1990's   "? w/one of my deliveries" (11/07/2012)  . Hyperlipidemia    LDL  . Hypertension 04/03/10  . Mitral valve prolapse   . Seizures ([place]) 1991   "related to pregnancy; I had HELLP" (11/07/2012)  . Sickle cell trait ([place])   . Systemic lupus erythematosus ([place]) 04/03/10   joint pain  . Type II diabetes mellitus [facility]) October 2012   Diet and exercise.    Past Surgical History:  Procedure Laterality Date  . ABDOMINAL HYSTERECTOMY  1997  . CARPAL TUNNEL RELEASE  2008   "right w/fracture OR" (11/07/2012)  . [place]  . POSTERIOR LUMBAR FUSION  11/07/2012   "L4-5" (11/07/2012)  . TRIGGER  FINGER RELEASE Right 05/04/2016   Procedure: MINOR RELEASE TRIGGER FINGER/A-1 PULLEY;  Surgeon: Charlotte Crumb, MD;  Location: [place];  Service: Orthopedics;  Laterality: Right;  . WRIST FRACTURE SURGERY  2008   "right" (11/07/2012)     Current Outpatient Prescriptions  Medication Sig Dispense Refill  . amLODipine (NORVASC) 10 MG tablet Take 10 mg by mouth at bedtime.     Marland Kitchen aspirin EC 81 MG tablet Take 81 mg by mouth every morning.    . benazepril-hydrochlorthiazide (LOTENSIN HCT) 20-25 MG per tablet Take 1 tablet by mouth every morning.     . Calcium-Vitamin D (CVS CALCIUM-600/VIT D PO) Take 1 tablet by mouth daily.    . Cetirizine-Pseudoephedrine (ZYRTEC-D PO) Take 1 tablet by mouth daily as needed. For allergies    . cloNIDine (CATAPRES) 0.1 MG tablet Take 0.1 mg by mouth 2 (two) times daily.    Marland Kitchen estradiol (CLIMARA) 0.1 mg/24hr Place 1 patch onto the skin once a week. Sunday    . meloxicam (MOBIC) 7.5 MG tablet Take 7.5 mg by mouth daily as needed for pain.    . Multiple Vitamin (MULTIVITAMIN WITH MINERALS) TABS Take 1 tablet by mouth daily.    . simvastatin (ZOCOR) 20 MG tablet Take 20 mg by mouth daily at 6 PM.    .  valACYclovir (VALTREX) 500 MG tablet Take 500 mg by mouth daily.      No current facility-administered medications for this visit.     Allergies:   Ciprofloxacin; Codeine; Hydrocodone; Levofloxacin; and Ultram [tramadol hcl]    Social History:  The patient  reports that she has never smoked. She has never used smokeless tobacco. She reports that she drinks alcohol. She reports that she does not use drugs.   Family History:  The patient's family history includes Heart attack in her father; Heart disease in her other; Hypertension in her other.    ROS:  Please see the history of present illness.   Otherwise, review of systems are positive for none.   All other systems are reviewed and negative.    PHYSICAL EXAM: VS:  There were no vitals taken  for this visit. , BMI There is no height or weight on file to calculate BMI. Affect appropriate Healthy:  appears stated age 77: normal Neck supple with no adenopathy JVP normal no bruits no thyromegaly Lungs clear with no wheezing and good diaphragmatic motion Heart:  S1/S2 no murmur, no rub, gallop or click PMI normal Abdomen: benighn, BS positve, no tenderness, no AAA no bruit.  No HSM or HJR Distal pulses intact with no bruits No edema Neuro non-focal Skin warm and dry No muscular weakness    EKG:  10/22/12  SR rate 61  Inferolateral T wave changes  02/28/16 DT tsyr 58 nonspecific ST changes  03/28/16 SR rate 60 inferolateral T wave changes    Recent Labs: 03/29/2016: ALT 16; Hemoglobin 13.6; Platelets 272 05/01/2016: BUN 9; Creatinine, Ser 0.78; Potassium 4.1; Sodium 138    Lipid Panel    Component Value Date/Time   CHOL 170 03/29/2016 0847   TRIG 41 03/29/2016 0847   HDL 83 03/29/2016 0847   CHOLHDL 2.0 03/29/2016 0847   VLDL 8 03/29/2016 0847   LDLCALC 79 03/29/2016 0847      Wt Readings from Last 3 Encounters:  05/04/16 194 lb 3.2 oz (88.1 kg)  03/28/16 194 lb 1.9 oz (88.1 kg)  04/08/13 172 lb (78 kg)      Other studies Reviewed: Additional studies/ records that were reviewed today include: Notes from primary ECG labs DR Laurann Montana .    ASSESSMENT AND PLAN:  1. Chest pain:  Cardiac CT without obstructive disease 04/04/16  Calcium score 34 on statin and ASA   2. HTN:  Followed by Dr Laurann Montana very high in office today and she took her meds. Lifestyle is good with exercise and diet.  BP monitor reviewed from 04/03/16 and average BP 135/85 stable  3. DM:  Discussed low carb diet.  Target hemoglobin A1c is 6.5 or less.  Continue current medications.    Current medicines are reviewed at length with the patient today.  The patient does not have concerns regarding medicines.  The following changes have been made:  no change   No orders of the defined types  were placed in this encounter.    Disposition:   FU with me in a year       Signed, Jenkins Rouge, MD  01/04/2017 10:24 AM    [facility] [place], [place], [place]  [address] Phone: [phone]; Fax: [phone]

## 2017-01-14 ENCOUNTER — Ambulatory Visit: Payer: 59 | Admitting: Cardiovascular Disease

## 2017-01-16 ENCOUNTER — Encounter: Payer: Self-pay | Admitting: Cardiovascular Disease

## 2017-01-16 ENCOUNTER — Ambulatory Visit (INDEPENDENT_AMBULATORY_CARE_PROVIDER_SITE_OTHER): Payer: 59 | Admitting: Cardiovascular Disease

## 2017-01-16 VITALS — BP 128/82 | HR 73 | Ht 66.0 in | Wt 189.0 lb

## 2017-01-16 DIAGNOSIS — R0789 Other chest pain: Secondary | ICD-10-CM

## 2017-01-16 DIAGNOSIS — Z0000 Encounter for general adult medical examination without abnormal findings: Secondary | ICD-10-CM

## 2017-01-16 NOTE — Patient Instructions (Addendum)

## 2017-01-16 NOTE — Progress Notes (Signed)
Cardiology Office Note   Date:  01/16/2017   ID:  Vickie Lane, Alferd Apa 1961-06-20, MRN [id number]  PCP:  Vickie Casco, MD  Cardiologist:   Vickie Rouge, MD   Chief Complaint  Patient presents with  . Routine F/U  . Chest Pain      History of Present Illness: Vickie Lane is a 56 y.o. female who presents for evaluation of exertional chest pain Carries diagnosis of elevated lipids  On statin   HTN on multiple drugs Family history of CAD . History of MVP.  Also sickle cell trait.  Reviewed notes from Dr Vickie Lane 02/28/16  Left sided and substernal pain associated with exercise lasts a few seconds no radiation, diphoresis cough or orthopnea One rest episode that lasted longer. Still does treadmill, elliptical and spinning classes Pain started in January. 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 Arthritis    "back" (11/07/2012)  . Chronic lower back pain   . Complication of anesthesia    somthing made her itch  . Constipation   . GERD (gastroesophageal reflux disease)   . Heart murmur    "MVP" (`11/07/2012)  . History of blood transfusion 1990's   "? w/one of my deliveries" (11/07/2012)  . Hyperlipidemia    LDL  . Hypertension 04/03/10  . Mitral valve prolapse   . Seizures ([place]) 1991   "related to pregnancy; I had HELLP" (11/07/2012)  . Sickle cell trait ([place])   . Systemic lupus erythematosus ([place]) 04/03/10   joint pain  . Type II diabetes mellitus [facility]) October  2012   Diet and exercise.    Past Surgical History:  Procedure Laterality Date  . ABDOMINAL HYSTERECTOMY  1997  . CARPAL TUNNEL RELEASE  2008   "right w/fracture OR" (11/07/2012)  . [place]  . POSTERIOR LUMBAR FUSION  11/07/2012   "L4-5" (11/07/2012)  . TRIGGER FINGER RELEASE Right 05/04/2016   Procedure: MINOR RELEASE TRIGGER FINGER/A-1 PULLEY;  Surgeon: Vickie Crumb, MD;  Location: [place];  Service: Orthopedics;  Laterality: Right;  . WRIST FRACTURE SURGERY  2008   "right" (11/07/2012)     Current Outpatient Prescriptions  Medication Sig Dispense Refill  . amLODipine (NORVASC) 10 MG tablet Take 10 mg by mouth at bedtime.     Marland Kitchen aspirin EC 81 MG tablet Take 81 mg by mouth every morning.    . benazepril-hydrochlorthiazide (LOTENSIN HCT) 20-25 MG tablet TAKE 1/2 TAB BY MOUTH DAILY  3  . Cetirizine-Pseudoephedrine (ZYRTEC-D PO) Take 1 tablet by mouth daily as needed. For allergies    . cloNIDine (CATAPRES) 0.1 MG tablet Take 0.1 mg by mouth 2 (two) times daily.    Marland Kitchen estradiol (CLIMARA) 0.1 mg/24hr Place 1 patch onto the skin once a week. Sunday    . meloxicam (MOBIC) 7.5 MG tablet Take 7.5 mg by mouth daily as needed for pain.    . Multiple Vitamin (  MULTIVITAMIN WITH MINERALS) TABS Take 1 tablet by mouth daily.    . simvastatin (ZOCOR) 20 MG tablet Take 20 mg by mouth daily at 6 PM.    . valACYclovir (VALTREX) 500 MG tablet Take 500 mg by mouth daily.      No current facility-administered medications for this visit.     Allergies:   Ciprofloxacin; Codeine; Hydrocodone; Levofloxacin; Levofloxacin; Ultram [tramadol hcl]; Hydrocodone-acetaminophen; and Tramadol    Social History:  The patient  reports that she has never smoked. She has never used smokeless tobacco. She reports that she drinks alcohol. She reports that she does not use drugs.   Family History:  The patient's family history includes Heart attack in her father; Heart disease  in her other; Hypertension in her other.    ROS:  Please see the history of present illness.   Otherwise, review of systems are positive for none.   All other systems are reviewed and negative.    PHYSICAL EXAM: VS:  BP 128/82   Pulse 73   Ht 5\' 6"  (1.676 m)   Wt 189 lb (85.7 kg)   SpO2 97%   BMI 30.51 kg/m  , BMI Body mass index is 30.51 kg/m. Affect appropriate Healthy:  appears stated age 39: normal Neck supple with no adenopathy JVP normal no bruits no thyromegaly Lungs clear with no wheezing and good diaphragmatic motion Heart:  S1/S2 no murmur, no rub, gallop or click PMI normal Abdomen: benighn, BS positve, no tenderness, no AAA no bruit.  No HSM or HJR Distal pulses intact with no bruits No edema Neuro non-focal Skin warm and dry No muscular weakness    EKG:  10/22/12  SR rate 61  Inferolateral T wave changes  02/28/16 DT tsyr 58 nonspecific ST changes  03/28/16 SR rate 60 inferolateral T wave changes    Recent Labs: 03/29/2016: ALT 16; Hemoglobin 13.6; Platelets 272 05/01/2016: BUN 9; Creatinine, Ser 0.78; Potassium 4.1; Sodium 138    Lipid Panel    Component Value Date/Time   CHOL 170 03/29/2016 0847   TRIG 41 03/29/2016 0847   HDL 83 03/29/2016 0847   CHOLHDL 2.0 03/29/2016 0847   VLDL 8 03/29/2016 0847   LDLCALC 79 03/29/2016 0847      Wt Readings from Last 3 Encounters:  01/16/17 189 lb (85.7 kg)  05/04/16 194 lb 3.2 oz (88.1 kg)  03/28/16 194 lb 1.9 oz (88.1 kg)      Other studies Reviewed: Additional studies/ records that were reviewed today include: cardiac CT BP monitor notes Dr Vickie Lane and lab work .    ASSESSMENT AND PLAN:  1.  Chest pain:  Family history , abnormal ECG and mostly exertional cardiac CT no obstructive disease Continue risk factor modification   2. HTN: continue current meds improved  3. DM:  fructosamine normal 03/29/16  Still issues with diet /fu Dr Vickie Lane she will discuss sendexa as  Weight loss strategy     Current medicines are reviewed at length with the patient today.  The patient does not have concerns regarding medicines.  The following changes have been made:  no change  Labs/ tests ordered today include: None  No orders of the defined types were placed in this encounter.    Disposition:   FU with me in a year      Signed, Vickie Rouge, MD  01/16/2017 9:18 AM    [facility] [place], [place], [place]  [address] Phone: [phone];  Fax: [phone]  

## 2017-05-30 DIAGNOSIS — N182 Chronic kidney disease, stage 2 (mild): Secondary | ICD-10-CM | POA: Diagnosis not present

## 2017-05-30 DIAGNOSIS — D573 Sickle-cell trait: Secondary | ICD-10-CM | POA: Diagnosis not present

## 2017-05-30 DIAGNOSIS — K219 Gastro-esophageal reflux disease without esophagitis: Secondary | ICD-10-CM | POA: Diagnosis not present

## 2017-05-30 DIAGNOSIS — M329 Systemic lupus erythematosus, unspecified: Secondary | ICD-10-CM | POA: Diagnosis not present

## 2017-05-30 DIAGNOSIS — Z0000 Encounter for general adult medical examination without abnormal findings: Secondary | ICD-10-CM | POA: Diagnosis not present

## 2017-05-30 DIAGNOSIS — I129 Hypertensive chronic kidney disease with stage 1 through stage 4 chronic kidney disease, or unspecified chronic kidney disease: Secondary | ICD-10-CM | POA: Diagnosis not present

## 2017-05-30 DIAGNOSIS — S93409A Sprain of unspecified ligament of unspecified ankle, initial encounter: Secondary | ICD-10-CM | POA: Diagnosis not present

## 2017-05-30 DIAGNOSIS — E7800 Pure hypercholesterolemia, unspecified: Secondary | ICD-10-CM | POA: Diagnosis not present

## 2017-05-30 DIAGNOSIS — E1121 Type 2 diabetes mellitus with diabetic nephropathy: Secondary | ICD-10-CM | POA: Diagnosis not present

## 2017-05-30 DIAGNOSIS — J301 Allergic rhinitis due to pollen: Secondary | ICD-10-CM | POA: Diagnosis not present

## 2017-06-09 ENCOUNTER — Emergency Department (HOSPITAL_COMMUNITY): Payer: 59

## 2017-06-09 ENCOUNTER — Encounter (HOSPITAL_COMMUNITY): Payer: Self-pay | Admitting: Emergency Medicine

## 2017-06-09 ENCOUNTER — Emergency Department (HOSPITAL_COMMUNITY)
Admission: EM | Admit: 2017-06-09 | Discharge: 2017-06-09 | Disposition: A | Discharge status: Home / Self Care | Payer: 59 | Attending: Emergency Medicine | Admitting: Emergency Medicine

## 2017-06-09 DIAGNOSIS — Y92830 Public park as the place of occurrence of the external cause: Secondary | ICD-10-CM | POA: Insufficient documentation

## 2017-06-09 DIAGNOSIS — Y998 Other external cause status: Secondary | ICD-10-CM | POA: Diagnosis not present

## 2017-06-09 DIAGNOSIS — M25562 Pain in left knee: Secondary | ICD-10-CM

## 2017-06-09 DIAGNOSIS — Y9316 Activity, rowing, canoeing, kayaking, rafting and tubing: Secondary | ICD-10-CM | POA: Diagnosis not present

## 2017-06-09 DIAGNOSIS — S83412A Sprain of medial collateral ligament of left knee, initial encounter: Secondary | ICD-10-CM | POA: Diagnosis not present

## 2017-06-09 DIAGNOSIS — S83411A Sprain of medial collateral ligament of right knee, initial encounter: Secondary | ICD-10-CM | POA: Diagnosis not present

## 2017-06-09 DIAGNOSIS — S8992XA Unspecified injury of left lower leg, initial encounter: Secondary | ICD-10-CM | POA: Diagnosis not present

## 2017-06-09 DIAGNOSIS — I10 Essential (primary) hypertension: Secondary | ICD-10-CM | POA: Diagnosis not present

## 2017-06-09 DIAGNOSIS — Z7982 Long term (current) use of aspirin: Secondary | ICD-10-CM | POA: Insufficient documentation

## 2017-06-09 DIAGNOSIS — E119 Type 2 diabetes mellitus without complications: Secondary | ICD-10-CM | POA: Diagnosis not present

## 2017-06-09 DIAGNOSIS — X501XXA Overexertion from prolonged static or awkward postures, initial encounter: Secondary | ICD-10-CM | POA: Insufficient documentation

## 2017-06-09 DIAGNOSIS — Z79899 Other long term (current) drug therapy: Secondary | ICD-10-CM | POA: Insufficient documentation

## 2017-06-09 MED ORDER — NAPROXEN 500 MG PO TABS: 500.0000 mg | ORAL_TABLET | Freq: Two times a day (BID) | ORAL | 0 refills | Status: DC

## 2017-06-09 MED ORDER — OXYCODONE-ACETAMINOPHEN 5-325 MG PO TABS: 2.0000 | ORAL_TABLET | ORAL | 0 refills | Status: DC | PRN

## 2017-06-09 NOTE — ED Provider Notes (Signed)
[name] Provider Note   CSN: [id number] Arrival date & time: 06/09/17  0846     History   Chief Complaint Chief Complaint  Patient presents with  . Knee Injury    HPI Vickie Lane is a 56 y.o. female. Chief complaint is left knee pain.  HPI 56 year old female with left knee pain. She was paddling [place], [place] at the [facility] yesterday. It can occur from behind she lost her balance and fell. She noticed while she was getting back onto the board that her left knee was painful. She was walking with a limp rest the dayfor through the night. Can bear weight this morning but states it is painful she limps and presents here.  Past Medical History:  Diagnosis Date  . Arthritis    "back" (11/07/2012)  . Chronic lower back pain   . Complication of anesthesia    somthing made her itch  . Constipation   . GERD (gastroesophageal reflux disease)   . Heart murmur    "MVP" (`11/07/2012)  . History of blood transfusion 1990's   "? w/one of my deliveries" (11/07/2012)  . Hyperlipidemia    LDL  . Hypertension 04/03/10  . Mitral valve prolapse   . Seizures ([place]) 1991   "related to pregnancy; I had HELLP" (11/07/2012)  . Sickle cell trait ([place])   . Systemic lupus erythematosus ([place]) 04/03/10   joint pain  . Type II diabetes mellitus [facility]) October 2012   Diet and exercise.    There are no active problems to display for this patient.   Past Surgical History:  Procedure Laterality Date  . ABDOMINAL HYSTERECTOMY  1997  . CARPAL TUNNEL RELEASE  2008   "right w/fracture OR" (11/07/2012)  . [place]  . POSTERIOR LUMBAR FUSION  11/07/2012   "L4-5" (11/07/2012)  . TRIGGER FINGER RELEASE Right 05/04/2016   Procedure: MINOR RELEASE TRIGGER FINGER/A-1 PULLEY;  Surgeon: Charlotte Crumb, MD;  Location: [facility];  Service: Orthopedics;  Laterality: Right;  . WRIST FRACTURE SURGERY  2008   "right" (11/07/2012)    OB  History    No data available       Home Medications    Prior to Admission medications   Medication Sig Start Date End Date Taking? Authorizing Provider  amLODipine (NORVASC) 10 MG tablet Take 10 mg by mouth at bedtime.     [provider]  aspirin EC 81 MG tablet Take 81 mg by mouth every morning.    [provider]  benazepril-hydrochlorthiazide (LOTENSIN HCT) 20-25 MG tablet TAKE 1/2 TAB BY MOUTH DAILY 01/07/17   [provider]  Cetirizine-Pseudoephedrine (ZYRTEC-D PO) Take 1 tablet by mouth daily as needed. For allergies    [provider]  cloNIDine (CATAPRES) 0.1 MG tablet Take 0.1 mg by mouth 2 (two) times daily.    [provider]  estradiol (CLIMARA) 0.1 mg/24hr Place 1 patch onto the skin once a week. Sunday    [provider]  meloxicam (MOBIC) 7.5 MG tablet Take 7.5 mg by mouth daily as needed for pain.    [provider]  Multiple Vitamin (MULTIVITAMIN WITH MINERALS) TABS Take 1 tablet by mouth daily.    [provider]  naproxen (NAPROSYN) 500 MG tablet Take 1 tablet (500 mg total) by mouth 2 (two) times daily. 06/09/17   Tanna Furry, MD  oxyCODONE-acetaminophen (PERCOCET/ROXICET) 5-325 MG tablet Take 2 tablets by mouth every 4 (four) hours as needed.  06/09/17   Tanna Furry, MD  simvastatin (ZOCOR) 20 MG tablet Take 20 mg by mouth daily at 6 PM. 02/18/15   [provider]  valACYclovir (VALTREX) 500 MG tablet Take 500 mg by mouth daily.     [provider]    Family History Family History  Problem Relation Age of Onset  . Heart attack Father   . Heart disease Other   . Hypertension Other   . Colon cancer Neg Hx     Social History Social History  Substance Use Topics  . Smoking status: Never Smoker  . Smokeless tobacco: Never Used  . Alcohol use Yes     Comment: 11/07/2012 "once/month might have a glass of wine"     Allergies   Ciprofloxacin; Codeine; Hydrocodone;  Levofloxacin; Levofloxacin; Ultram [tramadol hcl]; Hydrocodone-acetaminophen; and Tramadol   Review of Systems Review of Systems  Constitutional: Negative for appetite change, chills, diaphoresis, fatigue and fever.  HENT: Negative for mouth sores, sore throat and trouble swallowing.   Eyes: Negative for visual disturbance.  Respiratory: Negative for cough, chest tightness, shortness of breath and wheezing.   Cardiovascular: Negative for chest pain.  Gastrointestinal: Negative for abdominal distention, abdominal pain, diarrhea, nausea and vomiting.  Endocrine: Negative for polydipsia, polyphagia and polyuria.  Genitourinary: Negative for dysuria, frequency and hematuria.  Musculoskeletal: Positive for arthralgias and gait problem.  Skin: Negative for color change, pallor and rash.  Neurological: Negative for dizziness, syncope, light-headedness and headaches.  Hematological: Does not bruise/bleed easily.  Psychiatric/Behavioral: Negative for behavioral problems and confusion.     Physical Exam Updated Vital Signs BP 130/86 (BP Location: Left Arm)   Pulse 62   Temp 97.6 F (36.4 C) (Oral)   Resp 16   SpO2 100%   Physical Exam  Constitutional: She is oriented to person, place, and time. She appears well-developed and well-nourished. No distress.  HENT:  Head: Normocephalic.  Eyes: Pupils are equal, round, and reactive to light. Conjunctivae are normal. No scleral icterus.  Neck: Normal range of motion. Neck supple. No thyromegaly present.  Cardiovascular: Normal rate and regular rhythm.  Exam reveals no gallop and no friction rub.   No murmur heard. Pulmonary/Chest: Effort normal and breath sounds normal. No respiratory distress. She has no wheezes. She has no rales.  Abdominal: Soft. Bowel sounds are normal. She exhibits no distension. There is no tenderness. There is no rebound.  Musculoskeletal: Normal range of motion.  Tenderness on the medial left knee specifically  indirectly over the medial collateral ligament. No additional joint line pain or tenderness. No joint effusion palpably. Full extensor mechanism intact. Nontender over the fibular head laterally at the knee.  Neurological: She is alert and oriented to person, place, and time.  Skin: Skin is warm and dry. No rash noted.  Psychiatric: She has a normal mood and affect. Her behavior is normal.     ED Treatments / Results  Labs (all labs ordered are listed, but only abnormal results are displayed) Labs Reviewed - No data to display  EKG  EKG Interpretation None       Radiology Dg Knee Complete 4 Views Left  Result Date: 06/09/2017 CLINICAL DATA:  Left knee yesterday, medial LEFT knee pain, knocked over while on LEFT at L4 twisting LEFT knee EXAM: LEFT KNEE - COMPLETE 4+ VIEW COMPARISON:  None FINDINGS: Osseous mineralization normal. Joint spaces preserved. No acute fracture, dislocation, or bone destruction. No knee joint effusion. IMPRESSION: Normal exam. Electronically Signed   By:  Lavonia Dana M.D.   On: 06/09/2017 09:20    Procedures Procedures (including critical care time)  Medications Ordered in ED Medications - No data to display   Initial Impression / Assessment and Plan / ED Course  I have reviewed the triage vital signs and the nursing notes.  Pertinent labs & imaging results that were available during my care of the patient were reviewed by me and considered in my medical decision making (see chart for details).       Clinical joint effusion, or plateau fracture. Diagnoses at this time is probable medial collateral ligament strain. Plan treatment is nonweightbearing with crutches ice Ace wrap intermittent naproxen. Number for Percocet as needed for pain and she states she is unable to sleep. Orthopedic follow-up with her orthopedic surgeon in one week if still painful limping.  Final Clinical Impressions(s) / ED Diagnoses   Final diagnoses:  Acute pain of left knee   Sprain of medial collateral ligament of left knee, initial encounter    New Prescriptions New Prescriptions   NAPROXEN (NAPROSYN) 500 MG TABLET    Take 1 tablet (500 mg total) by mouth 2 (two) times daily.   OXYCODONE-ACETAMINOPHEN (PERCOCET/ROXICET) 5-325 MG TABLET    Take 2 tablets by mouth every 4 (four) hours as needed.     Tanna Furry, MD 06/09/17 [phone]

## 2017-06-09 NOTE — ED Triage Notes (Signed)
Pt was knocked off paddleboard yesterday. Pt is ambulatory, but has pain in L medial knee.

## 2017-06-09 NOTE — Discharge Instructions (Signed)
Crutches until walking without limp. Follow-up with your or speak surgeon 7 days with continued pain or limp. Continue treatment ice. Naproxen for mild pain. Percocet as needed for the next few nights is unable to sleep secondary to pain.

## 2017-07-04 ENCOUNTER — Ambulatory Visit (INDEPENDENT_AMBULATORY_CARE_PROVIDER_SITE_OTHER): Payer: 59 | Admitting: Obstetrics

## 2017-07-04 ENCOUNTER — Encounter: Payer: Self-pay | Admitting: Obstetrics

## 2017-07-04 VITALS — BP 146/85 | HR 69 | Ht 66.0 in | Wt 174.0 lb

## 2017-07-04 DIAGNOSIS — I10 Essential (primary) hypertension: Secondary | ICD-10-CM

## 2017-07-04 DIAGNOSIS — I341 Nonrheumatic mitral (valve) prolapse: Secondary | ICD-10-CM

## 2017-07-04 DIAGNOSIS — Z01419 Encounter for gynecological examination (general) (routine) without abnormal findings: Secondary | ICD-10-CM

## 2017-07-04 DIAGNOSIS — Z1389 Encounter for screening for other disorder: Secondary | ICD-10-CM | POA: Diagnosis not present

## 2017-07-04 DIAGNOSIS — M329 Systemic lupus erythematosus, unspecified: Secondary | ICD-10-CM

## 2017-07-04 DIAGNOSIS — Z113 Encounter for screening for infections with a predominantly sexual mode of transmission: Secondary | ICD-10-CM

## 2017-07-04 DIAGNOSIS — A6004 Herpesviral vulvovaginitis: Secondary | ICD-10-CM

## 2017-07-04 DIAGNOSIS — E139 Other specified diabetes mellitus without complications: Secondary | ICD-10-CM

## 2017-07-04 DIAGNOSIS — N951 Menopausal and female climacteric states: Secondary | ICD-10-CM

## 2017-07-04 MED ORDER — VALACYCLOVIR HCL 500 MG PO TABS: 500.0000 mg | ORAL_TABLET | Freq: Every day | ORAL | 11 refills | Status: DC

## 2017-07-04 MED ORDER — ESTRADIOL 0.1 MG/24HR TD PTWK: 0.1000 mg | MEDICATED_PATCH | TRANSDERMAL | 11 refills | Status: DC

## 2017-07-04 NOTE — Progress Notes (Signed)
Pt is in the office for annual exam.

## 2017-07-04 NOTE — Progress Notes (Signed)
Subjective:        Vickie Lane is a 56 y.o. female here for a routine exam.  Current complaints: None.    Personal health questionnaire:  Is patient Ashkenazi Jewish, have a family history of breast and/or ovarian cancer: no Is there a family history of uterine cancer diagnosed at age < 27, gastrointestinal cancer, urinary tract cancer, family member who is a Field seismologist syndrome-associated carrier: no Is the patient overweight and hypertensive, family history of diabetes, personal history of gestational diabetes, preeclampsia or PCOS: no Is patient over 33, have PCOS,  family history of premature CHD under age 55, diabetes, smoke, have hypertension or peripheral artery disease:  no At any time, has a partner hit, kicked or otherwise hurt or frightened you?: no Over the past 2 weeks, have you felt down, depressed or hopeless?: no Over the past 2 weeks, have you felt little interest or pleasure in doing things?:no   Gynecologic History No LMP recorded. Patient has had a hysterectomy. Contraception: status post hysterectomy Last Pap: unknown. Results were: normal Last mammogram: 2017. Results were: normal  Obstetric History OB History  Gravida Para Term Preterm AB Living  6 3 1 2 3 3   SAB TAB Ectopic Multiple Live Births  1 2     3     # Outcome Date GA Lbr Len/2nd Weight Sex Delivery Anes PTL Lv  6 Preterm 03/05/92 [redacted]w[redacted]d   M CS-LTranv   LIV  5 Preterm 07/30/90 [redacted]w[redacted]d   M CS-LTranv   LIV  4 Term 03/15/76    F Vag-Spont   LIV  3 TAB           2 TAB           1 SAB               Past Medical History:  Diagnosis Date  . Arthritis    "back" (11/07/2012)  . Chronic lower back pain   . Complication of anesthesia    somthing made her itch  . Constipation   . GERD (gastroesophageal reflux disease)   . Heart murmur    "MVP" (`11/07/2012)  . History of blood transfusion 1990's   "? w/one of my deliveries" (11/07/2012)  . Hyperlipidemia    LDL  . Hypertension 04/03/10  .  Mitral valve prolapse   . Seizures ([place]) 1991   "related to pregnancy; I had HELLP" (11/07/2012)  . Sickle cell trait ([place])   . Systemic lupus erythematosus ([place]) 04/03/10   joint pain  . Type II diabetes mellitus [facility]) October 2012   Diet and exercise.    Past Surgical History:  Procedure Laterality Date  . ABDOMINAL HYSTERECTOMY  1997  . CARPAL TUNNEL RELEASE  2008   "right w/fracture OR" (11/07/2012)  . [place]  . POSTERIOR LUMBAR FUSION  11/07/2012   "L4-5" (11/07/2012)  . TRIGGER FINGER RELEASE Right 05/04/2016   Procedure: MINOR RELEASE TRIGGER FINGER/A-1 PULLEY;  Surgeon: Charlotte Crumb, MD;  Location: [facility];  Service: Orthopedics;  Laterality: Right;  . WRIST FRACTURE SURGERY  2008   "right" (11/07/2012)     Current Outpatient Prescriptions:  .  amLODipine (NORVASC) 10 MG tablet, Take 10 mg by mouth at bedtime. , Disp: , Rfl:  .  aspirin EC 81 MG tablet, Take 81 mg by mouth every morning., Disp: , Rfl:  .  benazepril-hydrochlorthiazide (LOTENSIN HCT) 20-25 MG tablet, TAKE 1/2 TAB BY MOUTH DAILY, Disp: , Rfl: 3 .  Cetirizine-Pseudoephedrine (ZYRTEC-D PO), Take 1 tablet by mouth daily as needed. For allergies, Disp: , Rfl:  .  cloNIDine (CATAPRES) 0.1 MG tablet, Take 0.1 mg by mouth 2 (two) times daily., Disp: , Rfl:  .  estradiol (CLIMARA) 0.1 mg/24hr, Place 1 patch onto the skin once a week. Sunday, Disp: , Rfl:  .  meloxicam (MOBIC) 7.5 MG tablet, Take 7.5 mg by mouth daily as needed for pain., Disp: , Rfl:  .  Multiple Vitamin (MULTIVITAMIN WITH MINERALS) TABS, Take 1 tablet by mouth daily., Disp: , Rfl:  .  naproxen (NAPROSYN) 500 MG tablet, Take 1 tablet (500 mg total) by mouth 2 (two) times daily., Disp: 30 tablet, Rfl: 0 .  simvastatin (ZOCOR) 20 MG tablet, Take 20 mg by mouth daily at 6 PM., Disp: , Rfl:  .  valACYclovir (VALTREX) 500 MG tablet, Take 500 mg by mouth daily. , Disp: , Rfl:  .  oxyCODONE-acetaminophen  (PERCOCET/ROXICET) 5-325 MG tablet, Take 2 tablets by mouth every 4 (four) hours as needed. (Patient not taking: Reported on 07/04/2017), Disp: 4 tablet, Rfl: 0 Allergies  Allergen Reactions  . Ciprofloxacin Anaphylaxis  . Codeine Nausea Only, Other (See Comments), Anaphylaxis and Nausea And Vomiting    "dry heaves" (11/07/2012)  . Hydrocodone Nausea Only and Other (See Comments)    "dry heaves" (11/07/2012)  . Levofloxacin Anaphylaxis  . Levofloxacin Anaphylaxis and Rash  . Ultram [Tramadol Hcl] Nausea Only and Other (See Comments)    "dry heaves" (11/07/2012)  . Hydrocodone-Acetaminophen Nausea And Vomiting and Rash  . Tramadol Nausea And Vomiting and Rash    Social History  Substance Use Topics  . Smoking status: Never Smoker  . Smokeless tobacco: Never Used  . Alcohol use Yes     Comment: 11/07/2012 "once/month might have a glass of wine"    Family History  Problem Relation Age of Onset  . Heart attack Father   . Heart disease Other   . Hypertension Other   . Colon cancer Neg Hx       Review of Systems  Constitutional: negative for fatigue and weight loss Respiratory: negative for cough and wheezing Cardiovascular: negative for chest pain, fatigue and palpitations Gastrointestinal: negative for abdominal pain and change in bowel habits Musculoskeletal:negative for myalgias Neurological: negative for gait problems and tremors Behavioral/Psych: negative for abusive relationship, depression Endocrine: negative for temperature intolerance    Genitourinary:negative for abnormal menstrual periods, genital lesions, hot flashes, sexual problems and vaginal discharge Integument/breast: negative for breast lump, breast tenderness, nipple discharge and skin lesion(s)    Objective:       BP (!) 146/85   Pulse 69   Ht 5\' 6"  (1.676 m)   Wt 174 lb (78.9 kg)   BMI 28.08 kg/m  General:   alert  Skin:   no rash or abnormalities  Lungs:   clear to auscultation bilaterally   Heart:   regular rate and rhythm, S1, S2 normal, no murmur, click, rub or gallop  Breasts:   normal without suspicious masses, skin or nipple changes or axillary nodes  Abdomen:  normal findings: no organomegaly, soft, non-tender and no hernia  Pelvis:  External genitalia: normal general appearance Urinary system: urethral meatus normal and bladder without fullness, nontender Vaginal: normal without tenderness, induration or masses Cervix: normal appearance Adnexa: normal bimanual exam Uterus: anteverted and non-tender, normal size   Lab Review Urine pregnancy test Labs reviewed yes Radiologic studies reviewed yes  50% of 20 min visit spent on counseling  and coordination of care.    Assessment:     1. Encounter for well woman exam with routine gynecological exam Rx: - Cervicovaginal ancillary only  2. Herpes simplex vulvovaginitis - Valtrex Rx  3. Hot flashes due to menopause - Climara 0.1 mg patch Rx  4. HTN (hypertension), benign - stable, followed by PCP, on meds  5. MVP (mitral valve prolapse) - stable, followed by PCP  6. Diabetes 1.5, managed as type 2 ([place]) - stable, followed by PCP  7. History of systemic lupus erythematosus (SLE) - stable, followed by PCP   Plan:    Education reviewed: calcium supplements, depression evaluation, low fat, low cholesterol diet, safe sex/STD prevention, self breast exams and weight bearing exercise. Follow up in: 1 year.   No orders of the defined types were placed in this encounter.  No orders of the defined types were placed in this encounter.

## 2017-07-05 LAB — CERVICOVAGINAL ANCILLARY ONLY
Bacterial vaginitis: NEGATIVE
Candida vaginitis: NEGATIVE
Chlamydia: NEGATIVE
Neisseria Gonorrhea: NEGATIVE
Trichomonas: NEGATIVE

## 2017-10-31 ENCOUNTER — Other Ambulatory Visit: Payer: Self-pay | Admitting: Family Medicine

## 2017-10-31 DIAGNOSIS — Z1231 Encounter for screening mammogram for malignant neoplasm of breast: Secondary | ICD-10-CM

## 2017-11-19 DIAGNOSIS — E785 Hyperlipidemia, unspecified: Secondary | ICD-10-CM

## 2017-11-19 HISTORY — DX: Hyperlipidemia, unspecified: E78.5

## 2017-11-22 DIAGNOSIS — H52223 Regular astigmatism, bilateral: Secondary | ICD-10-CM | POA: Diagnosis not present

## 2017-11-25 ENCOUNTER — Encounter: Payer: Self-pay | Admitting: Sports Medicine

## 2017-11-25 ENCOUNTER — Ambulatory Visit: Payer: 59 | Admitting: Sports Medicine

## 2017-11-25 VITALS — BP 130/84 | Ht 66.0 in | Wt 178.0 lb

## 2017-11-25 DIAGNOSIS — M216X2 Other acquired deformities of left foot: Secondary | ICD-10-CM

## 2017-11-25 DIAGNOSIS — M7742 Metatarsalgia, left foot: Secondary | ICD-10-CM

## 2017-11-25 DIAGNOSIS — M216X1 Other acquired deformities of right foot: Secondary | ICD-10-CM | POA: Diagnosis not present

## 2017-11-25 NOTE — Progress Notes (Signed)
   Subjective:    Patient ID: Vickie Lane, female    DOB: 08-18-1961, 57 y.o.   MRN: [id number]  HPI chief complaint: Left foot pain  Very pleasant 57 year old female comes in today complaining of long-standing left foot pain. Over the past month or so she has began to experience some pain along the plantar aspect of her foot, specifically over the area of the metatarsal heads. She denies any trauma. She denies pain on the dorsum of the foot. She has not noticed any swelling. She enjoys running. She does have her running shoes with her today. No numbness or tingling. No pain at rest. No pain in the heel. No pain in her right foot.  Past medical history reviewed Medications reviewed Allergies reviewed    Review of Systems As above    Objective:   Physical Exam  Well-developed, well-nourished. No acute distress. Sitting comfortably in the exam room. Vital signs reviewed.  Examination of her feet in the standing position shows moderate pes planus with collapse of the transverse arches bilaterally. Hammertoe formation on both feet. No significant callus formation. She has some tenderness to palpation along the third and fourth metatarsal heads on the plantar aspect of the left foot. Neurovascularly intact distally. She pronates when walking but walks without a limp.      Assessment & Plan:   Left foot pain secondary to metatarsalgia Pronation  Were going to start with a green sports insole, scaphoid pads, and a metatarsal pad on the left. Patient will return to the office in 4 weeks for reevaluation. If she finds the temporary insert to be comfortable then we will discuss custom orthotics at follow-up. I've also recommended that she get a running shoe with a wider toe box. Call with questions or concerns prior to her follow-up visit.

## 2017-11-29 ENCOUNTER — Ambulatory Visit
Admission: RE | Admit: 2017-11-29 | Discharge: 2017-11-29 | Disposition: A | Discharge status: Home / Self Care | Payer: 59 | Source: Ambulatory Visit | Attending: Family Medicine | Admitting: Family Medicine

## 2017-11-29 DIAGNOSIS — Z1231 Encounter for screening mammogram for malignant neoplasm of breast: Secondary | ICD-10-CM

## 2017-12-02 DIAGNOSIS — I129 Hypertensive chronic kidney disease with stage 1 through stage 4 chronic kidney disease, or unspecified chronic kidney disease: Secondary | ICD-10-CM | POA: Diagnosis not present

## 2017-12-02 DIAGNOSIS — E1121 Type 2 diabetes mellitus with diabetic nephropathy: Secondary | ICD-10-CM | POA: Diagnosis not present

## 2017-12-02 DIAGNOSIS — N182 Chronic kidney disease, stage 2 (mild): Secondary | ICD-10-CM | POA: Diagnosis not present

## 2017-12-02 DIAGNOSIS — D573 Sickle-cell trait: Secondary | ICD-10-CM | POA: Diagnosis not present

## 2017-12-02 DIAGNOSIS — E7800 Pure hypercholesterolemia, unspecified: Secondary | ICD-10-CM | POA: Diagnosis not present

## 2018-01-06 ENCOUNTER — Ambulatory Visit: Payer: 59 | Admitting: Sports Medicine

## 2018-01-06 ENCOUNTER — Encounter: Payer: Self-pay | Admitting: Sports Medicine

## 2018-01-06 VITALS — BP 153/79 | HR 61 | Ht 66.0 in | Wt 178.0 lb

## 2018-01-06 DIAGNOSIS — M7742 Metatarsalgia, left foot: Secondary | ICD-10-CM

## 2018-01-06 DIAGNOSIS — M216X2 Other acquired deformities of left foot: Secondary | ICD-10-CM | POA: Diagnosis not present

## 2018-01-06 DIAGNOSIS — M216X1 Other acquired deformities of right foot: Secondary | ICD-10-CM | POA: Diagnosis not present

## 2018-01-06 NOTE — Progress Notes (Signed)
Patient ID: Vickie Lane, female   DOB: 02/11/1961, 57 y.o.   MRN: [id number]  Patient comes in today for follow-up on left foot pain and pronation. She has found the green sports insoles and metatarsal pad to be comfortable. She would like to proceed today with custom orthotics.  Custom orthotics were created today. Patient found them to be comfortable prior to leaving the office. Gait was neutral. We also provided additional metatarsal pads for her to use in her dress shoes. She may continue with activity as tolerated and will follow-up with [place] as needed. Total time spent with the patient today was 30 minutes with greater than 50% of the time spent in face-to-face consultation discussing orthotic construction, instruction, and fitting.  Patient was fitted for a : standard, cushioned, semi-rigid orthotic. The orthotic was heated and afterward the patient stood on the orthotic blank positioned on the orthotic stand. The patient was positioned in subtalar neutral position and 10 degrees of ankle dorsiflexion in a weight bearing stance. After completion of molding, a stable base was applied to the orthotic blank. The blank was ground to a stable position for weight bearing. Size: 9 Base: Blue EVA Posting: none Additional orthotic padding: left metatarsal pad

## 2018-02-06 ENCOUNTER — Encounter: Payer: Self-pay | Admitting: Nurse Practitioner

## 2018-02-06 ENCOUNTER — Ambulatory Visit (INDEPENDENT_AMBULATORY_CARE_PROVIDER_SITE_OTHER): Payer: Self-pay | Admitting: Nurse Practitioner

## 2018-02-06 VITALS — BP 126/88 | HR 68 | Temp 97.9°F | Resp 16 | Wt 172.4 lb

## 2018-02-06 DIAGNOSIS — J0100 Acute maxillary sinusitis, unspecified: Secondary | ICD-10-CM

## 2018-02-06 MED ORDER — BENZONATATE 100 MG PO CAPS: 100.0000 mg | ORAL_CAPSULE | Freq: Three times a day (TID) | ORAL | 0 refills | Status: AC | PRN

## 2018-02-06 MED ORDER — FLUTICASONE PROPIONATE 50 MCG/ACT NA SUSP: 2.0000 | Freq: Every day | NASAL | 0 refills | Status: DC

## 2018-02-06 MED ORDER — AMOXICILLIN-POT CLAVULANATE 875-125 MG PO TABS: 1.0000 | ORAL_TABLET | Freq: Two times a day (BID) | ORAL | 0 refills | Status: AC

## 2018-02-06 NOTE — Progress Notes (Signed)
   Subjective:    Patient ID: Vickie Lane, female    DOB: 1961-03-18, 57 y.o.   MRN: [id number]  Sinusitis  This is a new problem. The current episode started in the past 7 days. The problem has been rapidly improving since onset. There has been no fever. Her pain is at a severity of 7/10. The pain is moderate. Associated symptoms include chills, congestion, coughing, headaches, sinus pressure, a sore throat and swollen glands. Treatments tried: zyrtec. The treatment provided moderate relief.      Review of Systems  Constitutional: Positive for chills.  HENT: Positive for congestion, sinus pressure and sore throat.   Eyes: Negative.   Respiratory: Positive for cough.   Cardiovascular: Negative.   Gastrointestinal: Negative.   Allergic/Immunologic: Positive for environmental allergies.  Neurological: Positive for headaches.       Objective:   Physical Exam  Constitutional: She is oriented to person, place, and time. She appears well-developed and well-nourished. No distress.  HENT:  Head: Normocephalic and atraumatic.  Right Ear: External ear normal.  Left Ear: External ear normal.  Eyes: Pupils are equal, round, and reactive to light. Conjunctivae and EOM are normal.  Neck: Normal range of motion. Neck supple. No tracheal deviation present. No thyromegaly present.  Cardiovascular: Normal rate, regular rhythm and normal heart sounds.  Pulmonary/Chest: Effort normal and breath sounds normal. No respiratory distress. She has no wheezes.  Abdominal: Soft. Bowel sounds are normal. She exhibits no distension. There is no tenderness.  Neurological: She is alert and oriented to person, place, and time.  Skin: Skin is warm and dry.  Psychiatric: She has a normal mood and affect.          Assessment & Plan:  Acute Maxillary Sinusitis Patient education provided for Sinusitis.  Patient to take Augmentin and Flonase as directed. Patient will use humidifier at home.  Encouraged use  of saline nasal spray.  Ibuprofen or Tylenol for pain, fever or general discomfort.  Patient to use cough drops if needed for cough.  Increase fluids, rest.  Patient will go to ER if difficulty breathing, SOB or other concerns.  Patient verbalizes understanding and has no questions at time of discharge.

## 2018-02-06 NOTE — Patient Instructions (Signed)

## 2018-02-10 ENCOUNTER — Telehealth: Payer: Self-pay

## 2018-02-10 NOTE — Telephone Encounter (Signed)
Pt line was busy, could not leave a vm.

## 2018-03-03 ENCOUNTER — Encounter: Payer: Self-pay | Admitting: Internal Medicine

## 2018-06-09 DIAGNOSIS — D573 Sickle-cell trait: Secondary | ICD-10-CM | POA: Diagnosis not present

## 2018-06-09 DIAGNOSIS — N182 Chronic kidney disease, stage 2 (mild): Secondary | ICD-10-CM | POA: Diagnosis not present

## 2018-06-09 DIAGNOSIS — J301 Allergic rhinitis due to pollen: Secondary | ICD-10-CM | POA: Diagnosis not present

## 2018-06-09 DIAGNOSIS — E7800 Pure hypercholesterolemia, unspecified: Secondary | ICD-10-CM | POA: Diagnosis not present

## 2018-06-09 DIAGNOSIS — K219 Gastro-esophageal reflux disease without esophagitis: Secondary | ICD-10-CM | POA: Diagnosis not present

## 2018-06-09 DIAGNOSIS — M329 Systemic lupus erythematosus, unspecified: Secondary | ICD-10-CM | POA: Diagnosis not present

## 2018-06-09 DIAGNOSIS — I129 Hypertensive chronic kidney disease with stage 1 through stage 4 chronic kidney disease, or unspecified chronic kidney disease: Secondary | ICD-10-CM | POA: Diagnosis not present

## 2018-06-09 DIAGNOSIS — E1121 Type 2 diabetes mellitus with diabetic nephropathy: Secondary | ICD-10-CM | POA: Diagnosis not present

## 2018-06-09 DIAGNOSIS — Z0000 Encounter for general adult medical examination without abnormal findings: Secondary | ICD-10-CM | POA: Diagnosis not present

## 2018-07-07 ENCOUNTER — Encounter: Payer: Self-pay | Admitting: Obstetrics

## 2018-07-07 ENCOUNTER — Ambulatory Visit (INDEPENDENT_AMBULATORY_CARE_PROVIDER_SITE_OTHER): Payer: 59 | Admitting: Obstetrics

## 2018-07-07 VITALS — BP 138/88 | HR 61 | Ht 66.0 in | Wt 173.6 lb

## 2018-07-07 DIAGNOSIS — Z01419 Encounter for gynecological examination (general) (routine) without abnormal findings: Secondary | ICD-10-CM

## 2018-07-07 DIAGNOSIS — Z0000 Encounter for general adult medical examination without abnormal findings: Secondary | ICD-10-CM | POA: Diagnosis not present

## 2018-07-07 DIAGNOSIS — N898 Other specified noninflammatory disorders of vagina: Secondary | ICD-10-CM | POA: Diagnosis not present

## 2018-07-07 DIAGNOSIS — N951 Menopausal and female climacteric states: Secondary | ICD-10-CM

## 2018-07-07 DIAGNOSIS — I10 Essential (primary) hypertension: Secondary | ICD-10-CM

## 2018-07-07 DIAGNOSIS — A6004 Herpesviral vulvovaginitis: Secondary | ICD-10-CM

## 2018-07-07 DIAGNOSIS — E139 Other specified diabetes mellitus without complications: Secondary | ICD-10-CM

## 2018-07-07 DIAGNOSIS — Z113 Encounter for screening for infections with a predominantly sexual mode of transmission: Secondary | ICD-10-CM

## 2018-07-07 MED ORDER — ESTRADIOL 0.1 MG/24HR TD PTWK: 0.1000 mg | MEDICATED_PATCH | TRANSDERMAL | 11 refills | Status: DC

## 2018-07-07 MED ORDER — VALACYCLOVIR HCL 500 MG PO TABS: 500.0000 mg | ORAL_TABLET | Freq: Every day | ORAL | 11 refills | Status: DC

## 2018-07-07 NOTE — Progress Notes (Signed)
Patient is in the office for annual, last pap 08-27-11. Pt has history of abdominal hysterectomy. Pt reports having mammogram last year.

## 2018-07-07 NOTE — Progress Notes (Signed)
Subjective:        Vickie Lane is a 57 y.o. female here for a routine exam.  Current complaints: None.    Personal health questionnaire:  Is patient Ashkenazi Jewish, have a family history of breast and/or ovarian cancer: no Is there a family history of uterine cancer diagnosed at age < 60, gastrointestinal cancer, urinary tract cancer, family member who is a Field seismologist syndrome-associated carrier: no Is the patient overweight and hypertensive, family history of diabetes, personal history of gestational diabetes, preeclampsia or PCOS: no Is patient over 20, have PCOS,  family history of premature CHD under age 61, diabetes, smoke, have hypertension or peripheral artery disease:  no At any time, has a partner hit, kicked or otherwise hurt or frightened you?: no Over the past 2 weeks, have you felt down, depressed or hopeless?: no Over the past 2 weeks, have you felt little interest or pleasure in doing things?:no   Gynecologic History No LMP recorded. Patient has had a hysterectomy. Contraception: status post hysterectomy Last Pap: 2012. Results were: normal Last mammogram: 2018. Results were: normal  Obstetric History OB History  Gravida Para Term Preterm AB Living  6 3 1 2 3 3   SAB TAB Ectopic Multiple Live Births  1 2     3     # Outcome Date GA Lbr Len/2nd Weight Sex Delivery Anes PTL Lv  6 Preterm 03/05/92 [redacted]w[redacted]d   M CS-LTranv   LIV  5 Preterm 07/30/90 [redacted]w[redacted]d   M CS-LTranv   LIV  4 Term 03/15/76    F Vag-Spont   LIV  3 TAB           2 TAB           1 SAB             Past Medical History:  Diagnosis Date  . Arthritis    "back" (11/07/2012)  . Chronic lower back pain   . Complication of anesthesia    somthing made her itch  . Constipation   . GERD (gastroesophageal reflux disease)   . Heart murmur    "MVP" (`11/07/2012)  . History of blood transfusion 1990's   "? w/one of my deliveries" (11/07/2012)  . Hyperlipidemia    LDL  . Hypertension 04/03/10  . Mitral  valve prolapse   . Seizures ([place]) 1991   "related to pregnancy; I had HELLP" (11/07/2012)  . Sickle cell trait ([place])   . Systemic lupus erythematosus ([place]) 04/03/10   joint pain  . Type II diabetes mellitus [facility] October 2012   Diet and exercise.    Past Surgical History:  Procedure Laterality Date  . ABDOMINAL HYSTERECTOMY  1997  . CARPAL TUNNEL RELEASE  2008   "right w/fracture OR" (11/07/2012)  . [place]  . POSTERIOR LUMBAR FUSION  11/07/2012   "L4-5" (11/07/2012)  . TRIGGER FINGER RELEASE Right 05/04/2016   Procedure: MINOR RELEASE TRIGGER FINGER/A-1 PULLEY;  Surgeon: Charlotte Crumb, MD;  Location: [place];  Service: Orthopedics;  Laterality: Right;  . WRIST FRACTURE SURGERY  2008   "right" (11/07/2012)     Current Outpatient Medications:  .  amLODipine (NORVASC) 10 MG tablet, Take 10 mg by mouth at bedtime. , Disp: , Rfl:  .  aspirin EC 81 MG tablet, Take 81 mg by mouth every morning., Disp: , Rfl:  .  benazepril-hydrochlorthiazide (LOTENSIN HCT) 20-25 MG tablet, TAKE 1/2 TAB BY MOUTH DAILY, Disp: , Rfl: 3 .  Cetirizine-Pseudoephedrine (ZYRTEC-D PO), Take 1 tablet by mouth daily as needed. For allergies, Disp: , Rfl:  .  cloNIDine (CATAPRES) 0.1 MG tablet, Take 0.1 mg by mouth 2 (two) times daily., Disp: , Rfl:  .  estradiol (CLIMARA) 0.1 mg/24hr patch, Place 1 patch (0.1 mg total) onto the skin once a week. Sunday, Disp: 4 patch, Rfl: 11 .  meloxicam (MOBIC) 7.5 MG tablet, Take 7.5 mg by mouth daily as needed for pain., Disp: , Rfl:  .  Multiple Vitamin (MULTIVITAMIN WITH MINERALS) TABS, Take 1 tablet by mouth daily., Disp: , Rfl:  .  naproxen (NAPROSYN) 500 MG tablet, Take 1 tablet (500 mg total) by mouth 2 (two) times daily., Disp: 30 tablet, Rfl: 0 .  simvastatin (ZOCOR) 20 MG tablet, Take 20 mg by mouth daily at 6 PM., Disp: , Rfl:  .  valACYclovir (VALTREX) 500 MG tablet, Take 1 tablet (500 mg total) by mouth daily., Disp: 30 tablet,  Rfl: 11 .  fluticasone (FLONASE) 50 MCG/ACT nasal spray, Place 2 sprays into both nostrils daily for 10 days., Disp: 16 g, Rfl: 0 Allergies  Allergen Reactions  . Ciprofloxacin Anaphylaxis  . Codeine Nausea Only, Other (See Comments), Anaphylaxis and Nausea And Vomiting    "dry heaves" (11/07/2012)  . Hydrocodone Nausea Only and Other (See Comments)    "dry heaves" (11/07/2012)  . Levofloxacin Anaphylaxis  . Levofloxacin Anaphylaxis and Rash  . Ultram [Tramadol Hcl] Nausea Only and Other (See Comments)    "dry heaves" (11/07/2012)  . Hydrocodone-Acetaminophen Nausea And Vomiting and Rash  . Tramadol Nausea And Vomiting and Rash    Social History   Tobacco Use  . Smoking status: Never Smoker  . Smokeless tobacco: Never Used  Substance Use Topics  . Alcohol use: Yes    Comment: 11/07/2012 "once/month might have a glass of wine"    Family History  Problem Relation Age of Onset  . Heart attack Father   . Heart disease Other   . Hypertension Other   . Colon cancer Neg Hx       Review of Systems  Constitutional: negative for fatigue and weight loss Respiratory: negative for cough and wheezing Cardiovascular: negative for chest pain, fatigue and palpitations Gastrointestinal: negative for abdominal pain and change in bowel habits Musculoskeletal:negative for myalgias Neurological: negative for gait problems and tremors Behavioral/Psych: negative for abusive relationship, depression Endocrine: Positive for hot flashes Genitourinary:negative genital lesions, sexual problems and vaginal discharge Integument/breast: negative for breast fibroadenomas     Objective:       BP 138/88   Pulse 61   Ht 5\' 6"  (1.676 m)   Wt 173 lb 9.6 oz (78.7 kg)   BMI 28.02 kg/m  General:   alert and no distress  Skin:   no rash or abnormalities  Lungs:   clear to auscultation bilaterally  Heart:   regular rate and rhythm, S1, S2 normal, no murmur, click, rub or gallop  Breasts:   mass at  3 o'colck, inner quadrant, periareolar   Abdomen:  normal findings: no organomegaly, soft, non-tender and no hernia  Pelvis:  External genitalia: normal general appearance Urinary system: urethral meatus normal and bladder without fullness, nontender Vaginal: normal without tenderness, induration or masses Cervix: absent Adnexa: normal bimanual exam Uterus: absent   Lab Review Urine pregnancy test Labs reviewed yes Radiologic studies reviewed yes  50% of 20 min visit spent on counseling and coordination of care.   Assessment:     1. Encounter for  routine gynecological examination with Papanicolaou smear of cervix - doing well  2. Screening for STD (sexually transmitted disease) Rx: - HIV antibody (with reflex) - RPR - Hepatitis B Surface AntiGEN - Hepatitis C Antibody  3. Vaginal discharge Rx: - Cervicovaginal ancillary only  4. Diabetes 1.5, managed as type 2 ([place]) - managed by PCP  5. HTN (hypertension), benign - managed by PCP  6. Hot flashes due to menopause Rx - estradiol (CLIMARA) 0.1 mg/24hr patch; Place 1 patch (0.1 mg total) onto the skin once a week. Sunday  Dispense: 4 patch; Refill: 11  7. Herpes simplex vulvovaginitis Rx: - valACYclovir (VALTREX) 500 MG tablet; Take 1 tablet (500 mg total) by mouth daily.  Dispense: 30 tablet; Refill: 11   Plan:    Education reviewed: calcium supplements, depression evaluation, low fat, low cholesterol diet, safe sex/STD prevention, self breast exams and weight bearing exercise. Follow up in: 1 year.   Meds ordered this encounter  Medications  . estradiol (CLIMARA) 0.1 mg/24hr patch    Sig: Place 1 patch (0.1 mg total) onto the skin once a week. Sunday    Dispense:  4 patch    Refill:  11  . valACYclovir (VALTREX) 500 MG tablet    Sig: Take 1 tablet (500 mg total) by mouth daily.    Dispense:  30 tablet    Refill:  11   Orders Placed This Encounter  Procedures  . HIV antibody (with reflex)  . RPR  .  Hepatitis B Surface AntiGEN  . Hepatitis C Antibody    Shelly Bombard MD 07-07-2018

## 2018-07-08 ENCOUNTER — Other Ambulatory Visit: Payer: 59

## 2018-07-08 DIAGNOSIS — Z01419 Encounter for gynecological examination (general) (routine) without abnormal findings: Secondary | ICD-10-CM

## 2018-07-08 LAB — HIV ANTIBODY (ROUTINE TESTING W REFLEX): HIV Screen 4th Generation wRfx: NONREACTIVE

## 2018-07-08 LAB — CERVICOVAGINAL ANCILLARY ONLY
BACTERIAL VAGINITIS: POSITIVE — AB
CANDIDA VAGINITIS: NEGATIVE
CHLAMYDIA, DNA PROBE: NEGATIVE
Neisseria Gonorrhea: NEGATIVE
TRICH (WINDOWPATH): NEGATIVE

## 2018-07-09 ENCOUNTER — Encounter: Payer: Self-pay | Admitting: Internal Medicine

## 2018-07-09 ENCOUNTER — Other Ambulatory Visit: Payer: Self-pay | Admitting: Obstetrics

## 2018-07-09 DIAGNOSIS — B9689 Other specified bacterial agents as the cause of diseases classified elsewhere: Secondary | ICD-10-CM

## 2018-07-09 DIAGNOSIS — N760 Acute vaginitis: Secondary | ICD-10-CM

## 2018-07-09 LAB — RPR: RPR: NONREACTIVE

## 2018-07-09 LAB — HEPATITIS C ANTIBODY: Hep C Virus Ab: <0.1 s/co ratio (ref 0.0–0.9)

## 2018-07-09 LAB — HEPATITIS B SURFACE ANTIGEN: HEP B S AG: NEGATIVE

## 2018-07-09 MED ORDER — TINIDAZOLE 500 MG PO TABS: 1000.0000 mg | ORAL_TABLET | Freq: Every day | ORAL | 2 refills | Status: DC

## 2018-08-26 NOTE — Progress Notes (Signed)
Cardiology Office Note   Date:  09/05/2018   ID:  Vickie Lane, DOB 28-Jun-1961, MRN [id number]  PCP:  Kelton Pillar, MD  Cardiologist:   Jenkins Rouge, MD   No chief complaint on file.     History of Present Illness:  57 y.o. f/u stypical chest pain. HLD on statin, HTN and family history of CAD. 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 Still working at [employer] one son age 58 with 5 children and another son who is working   Past Medical History:  Diagnosis Date  . Allergy   . Arthritis    "back" (11/07/2012)  . Chronic lower back pain   . Complication of anesthesia    somthing made her itch  . Constipation   . GERD (gastroesophageal reflux disease)   . Heart murmur    "MVP" (`11/07/2012)  . History of blood transfusion 1990's   "? w/one of my deliveries" (11/07/2012)  . Hyperlipidemia    LDL  . Hypertension 04/03/10  . Mitral valve prolapse   . Seizures ([place]) 1991   "related to pregnancy; I had HELLP" (11/07/2012)- lastseizure was 1991 per pt   . Sickle cell trait ([place])   . Systemic lupus erythematosus ([place]) 04/03/10   joint pain  . Type II diabetes mellitus [facility]) October 2012   Diet and exercise.    Past Surgical History:  Procedure Laterality Date  . ABDOMINAL HYSTERECTOMY  1997  . CARPAL TUNNEL RELEASE  2008   "right  w/fracture OR" (11/07/2012)  . [place]  . COLONOSCOPY    . POLYPECTOMY    . POSTERIOR LUMBAR FUSION  11/07/2012   "L4-5" (11/07/2012)  . TRIGGER FINGER RELEASE Right 05/04/2016   Procedure: MINOR RELEASE TRIGGER FINGER/A-1 PULLEY;  Surgeon: Charlotte Crumb, MD;  Location: [place];  Service: Orthopedics;  Laterality: Right;  . WRIST FRACTURE SURGERY  2008   "right" (11/07/2012)     Current Outpatient Medications  Medication Sig Dispense Refill  . amLODipine (NORVASC) 10 MG tablet Take 10 mg by mouth at bedtime.     Marland Kitchen aspirin EC 81 MG tablet Take 81 mg by mouth every morning.    . benazepril-hydrochlorthiazide (LOTENSIN HCT) 20-25 MG tablet TAKE 1/2 TAB BY MOUTH DAILY  3  . Cetirizine-Pseudoephedrine (ZYRTEC-D PO) Take 1 tablet by mouth daily as needed. For allergies    . cloNIDine (CATAPRES) 0.1 MG tablet Take 0.1 mg by mouth 2 (two) times daily.    Marland Kitchen estradiol (CLIMARA) 0.1 mg/24hr patch Place 1 patch (0.1 mg total) onto the skin once a week. Sunday 4 patch 11  . FREESTYLE LITE test strip   11  . glucose blood (FREESTYLE LITE) test strip     . Lancets (FREESTYLE) lancets   12  .  meloxicam (MOBIC) 7.5 MG tablet Take 7.5 mg by mouth daily as needed for pain.    . Multiple Vitamin (MULTIVITAMIN WITH MINERALS) TABS Take 1 tablet by mouth daily.    . naproxen (NAPROSYN) 500 MG tablet Take 1 tablet (500 mg total) by mouth 2 (two) times daily. 30 tablet 0  . simvastatin (ZOCOR) 20 MG tablet Take 20 mg by mouth daily at 6 PM.    . tinidazole (TINDAMAX) 500 MG tablet Take 2 tablets (1,000 mg total) by mouth daily with breakfast. 10 tablet 2  . valACYclovir (VALTREX) 500 MG tablet Take 1 tablet (500 mg total) by mouth daily. 30 tablet 11  . fluticasone (FLONASE) 50 MCG/ACT nasal spray Place 2 sprays into both nostrils daily for 10 days. 16 g 0   No current facility-administered medications for this visit.     Allergies:   Ciprofloxacin; Codeine;  Hydrocodone; Levofloxacin; and Ultram [tramadol hcl]    Social History:  The patient  reports that she has never smoked. She has never used smokeless tobacco. She reports that she drinks alcohol. She reports that she does not use drugs.   Family History:  The patient's family history includes Heart attack in her father; Heart disease in her other; Hypertension in her other.    ROS:  Please see the history of present illness.   Otherwise, review of systems are positive for none.   All other systems are reviewed and negative.    PHYSICAL EXAM: VS:  BP 140/86   Pulse 64   Ht 5\' 6"  (1.676 m)   Wt 172 lb 1.9 oz (78.1 kg)   SpO2 98%   BMI 27.78 kg/m  , BMI Body mass index is 27.78 kg/m. Affect appropriate Overweight female  HEENT: normal Neck supple with no adenopathy JVP normal no bruits no thyromegaly Lungs clear with no wheezing and good diaphragmatic motion Heart:  S1/S2 SEM RLSB  murmur, no rub, gallop or click PMI normal Abdomen: benighn, BS positve, no tenderness, no AAA no bruit.  No HSM or HJR Distal pulses intact with no bruits No edema Neuro non-focal Skin warm and dry No muscular weakness     EKG:  10/22/12  SR rate 61  Inferolateral T wave changes  02/28/16 DT tsyr 58 nonspecific ST changes  03/28/16 SR rate 60 inferolateral T wave changes 09/04/18 SR rate 64 LAE inferior lateral ST changes    Recent Labs: No results found for requested labs within last 8760 hours.    Lipid Panel    Component Value Date/Time   CHOL 170 03/29/2016 0847   TRIG 41 03/29/2016 0847   HDL 83 03/29/2016 0847   CHOLHDL 2.0 03/29/2016 0847   VLDL 8 03/29/2016 0847   LDLCALC 79 03/29/2016 0847      Wt Readings from Last 3 Encounters:  09/05/18 172 lb 1.9 oz (78.1 kg)  09/01/18 172 lb (78 kg)  07/07/18 173 lb 9.6 oz (78.7 kg)      Other studies Reviewed: Additional studies/ records that were reviewed today include: cardiac CT BP monitor notes Dr Laurann Montana and lab work  .    ASSESSMENT AND PLAN:  1.  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 HTN: Well controlled.  Continue current medications and low sodium Dash type diet.   3. DM:  fructosamine normal Still issues with diet /fu Dr Laurann Montana she  will discuss sendexa as  Weight loss strategy  4. Dyspnea:  Lungs clear TTE to assess RV/LV function and also to assess murmur Stress test To r/o anginal equivalent    Current medicines are reviewed at length with the patient today.  The patient does not have concerns regarding medicines.  The following changes have been made:  no change  Labs/ tests ordered today include: None   Orders Placed This Encounter  Procedures  . EXERCISE TOLERANCE TEST (ETT)  . EKG 12-Lead  . ECHOCARDIOGRAM COMPLETE     Disposition:   FU with me in a year      Signed, Jenkins Rouge, MD  09/05/2018 9:24 AM    [address], [place], [place]  [address] Phone: [phone]; Fax: [phone]

## 2018-09-01 ENCOUNTER — Ambulatory Visit (AMBULATORY_SURGERY_CENTER): Payer: Self-pay | Admitting: *Deleted

## 2018-09-01 ENCOUNTER — Encounter: Payer: Self-pay | Admitting: Internal Medicine

## 2018-09-01 VITALS — Ht 66.0 in | Wt 172.0 lb

## 2018-09-01 DIAGNOSIS — Z86010 Personal history of colonic polyps: Secondary | ICD-10-CM

## 2018-09-01 IMAGING — CR DG KNEE COMPLETE 4+V*L*
4 series · 4 of 4 positions shown · non-contrast
Comparison: None

CLINICAL DATA: Left knee yesterday, medial LEFT knee pain, knocked
over while on LEFT at L4 twisting LEFT knee

EXAM:
LEFT KNEE - COMPLETE 4+ VIEW

[x knee ap left (1 of 4)]
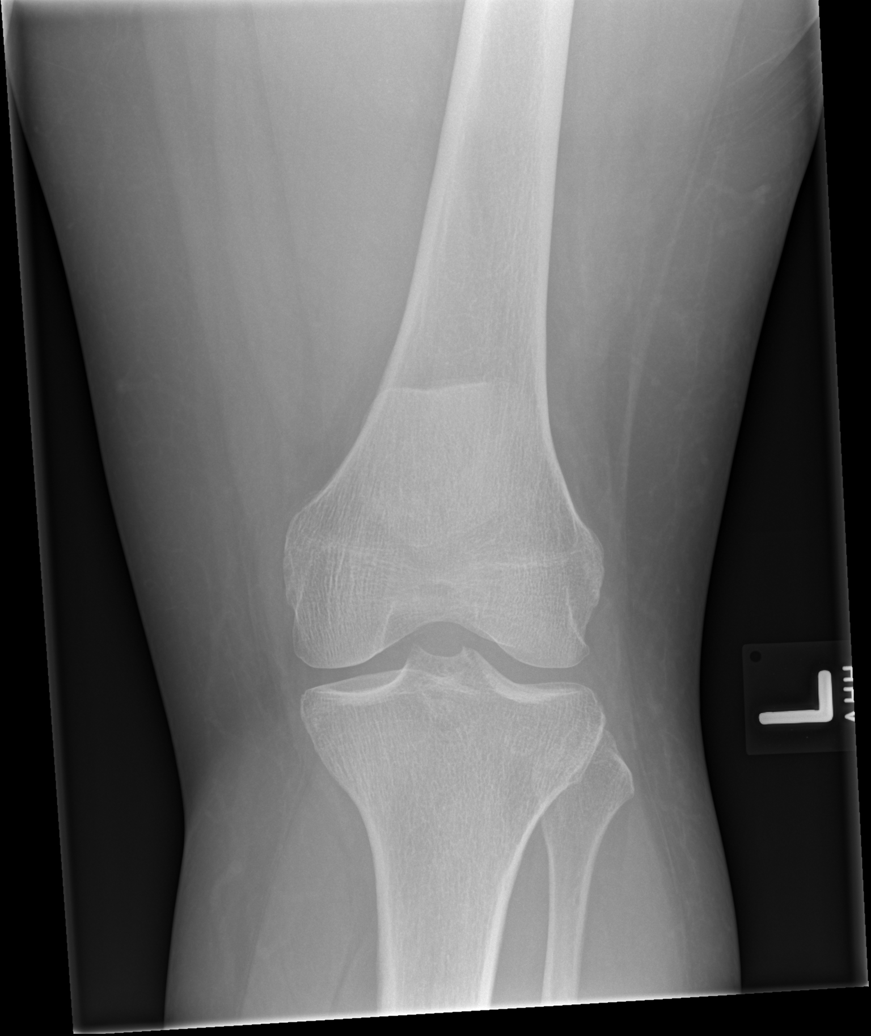

[x knee ap left (2 of 4)]
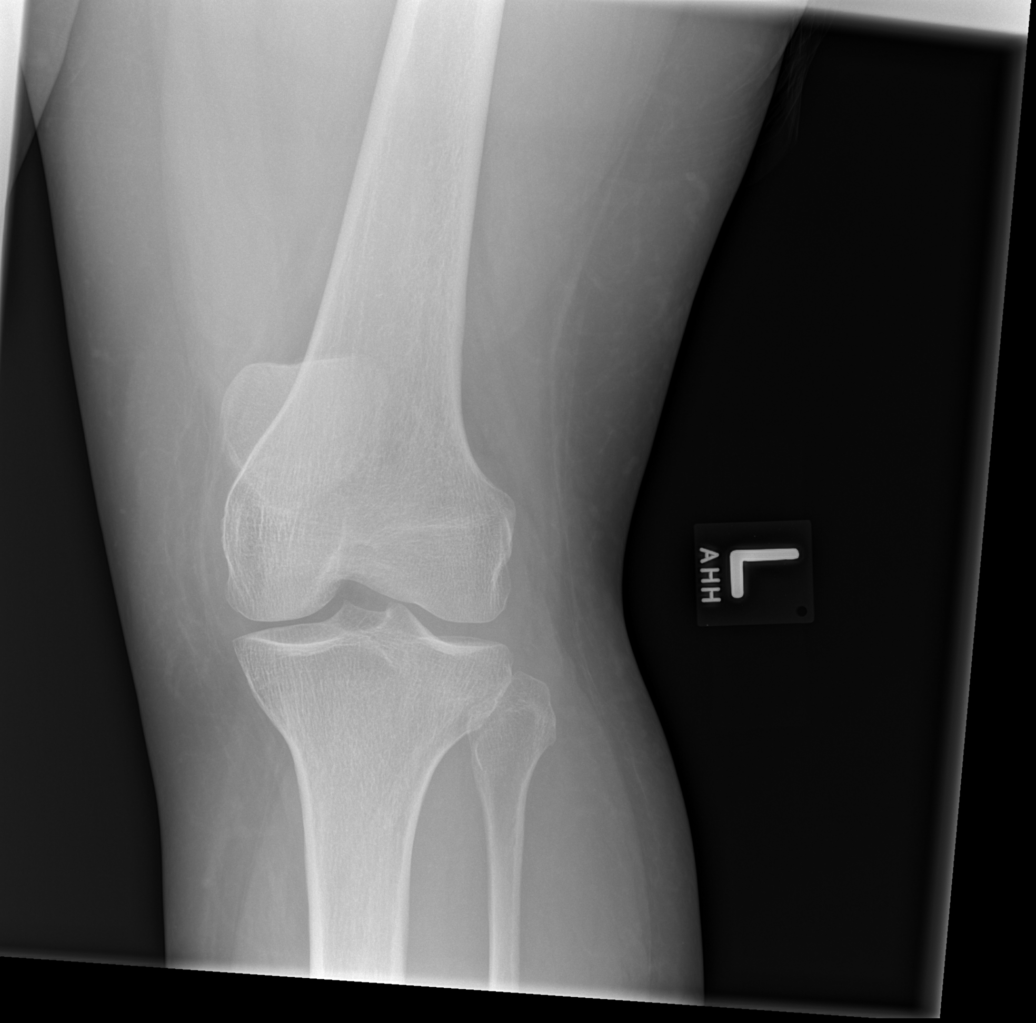

[x knee ap left (3 of 4)]
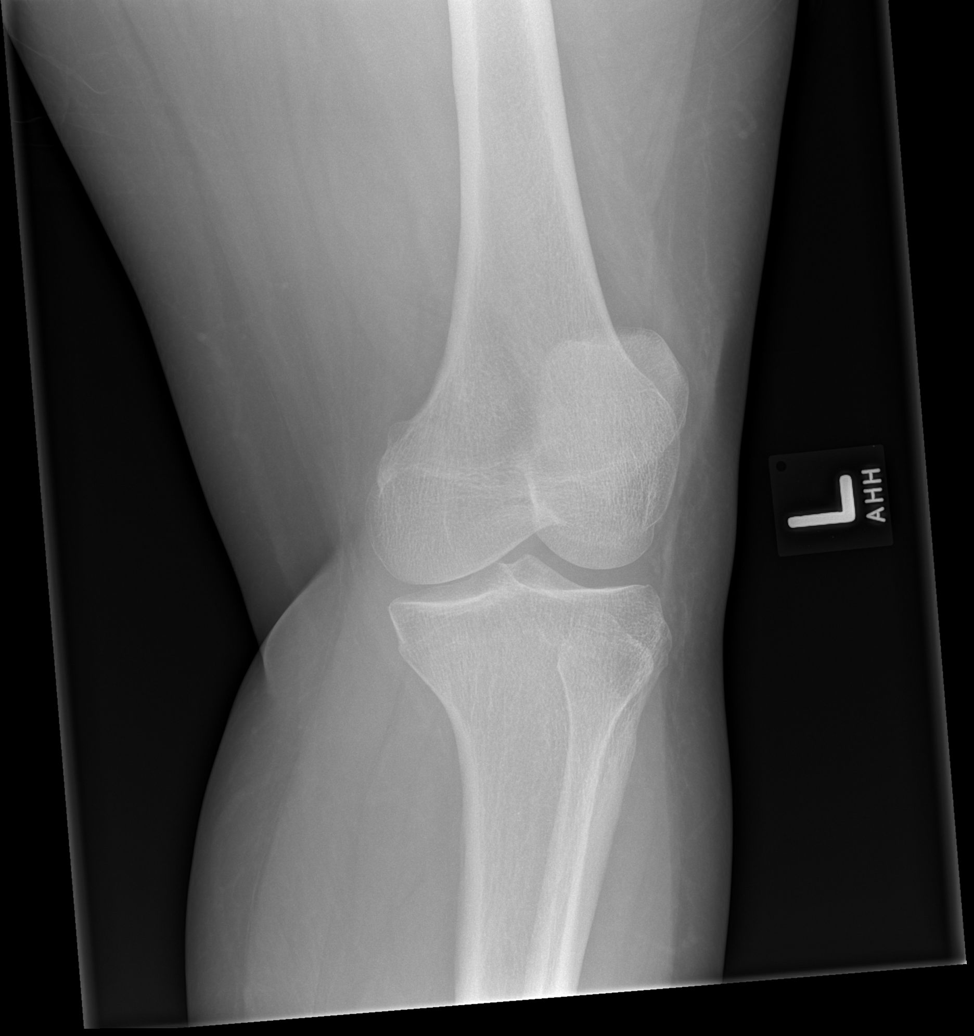

[x knee ap left (4 of 4)]
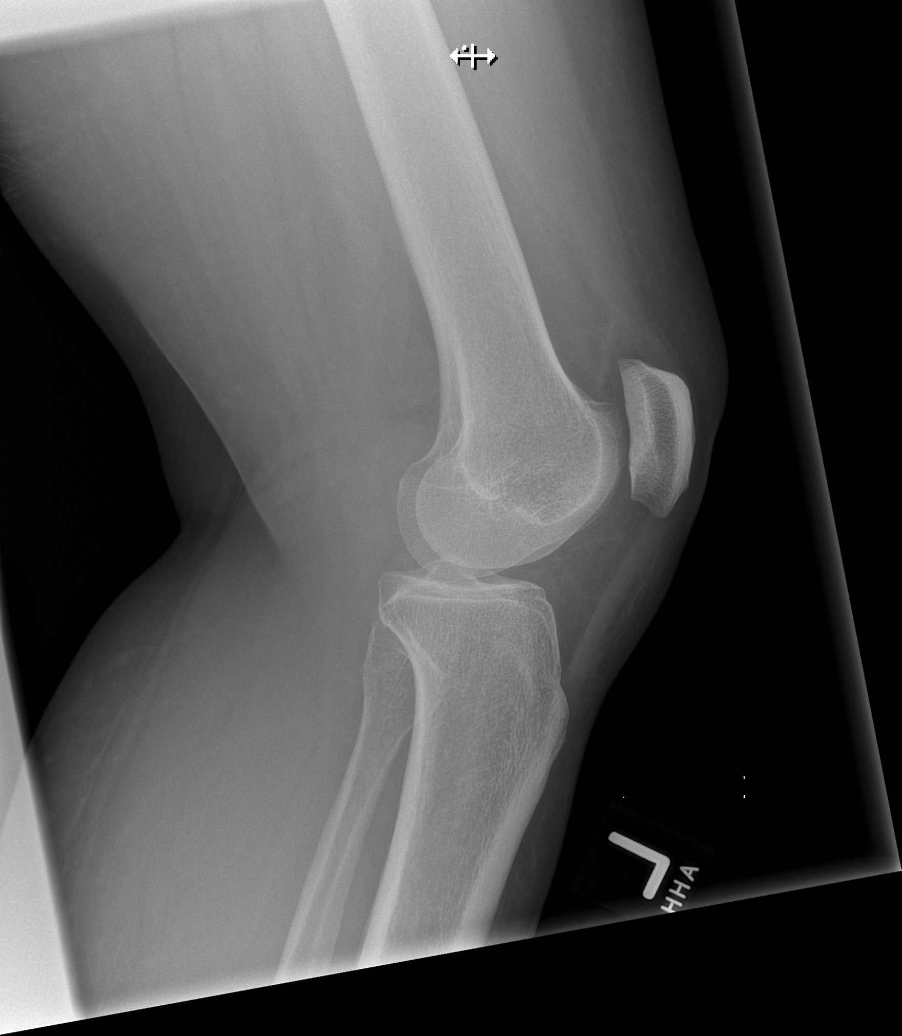

[4 of 4 positions shown; findings below may reference images not displayed]

FINDINGS: Osseous mineralization normal.

Joint spaces preserved.

No acute fracture, dislocation, or bone destruction.

No knee joint effusion.
IMPRESSION: Normal exam.

## 2018-09-01 MED ORDER — NA SULFATE-K SULFATE-MG SULF 17.5-3.13-1.6 GM/177ML PO SOLN: 1.0000 | Freq: Once | ORAL | 0 refills | Status: AC

## 2018-09-01 NOTE — Progress Notes (Signed)
No egg or soy allergy known to patient  No issues with past sedation with any surgeries  or procedures, no intubation problems - with hysterectomy something made her itch pt unsure what-  No diet pills per patient No home 02 use per patient  No blood thinners per patient  Pt denies issues with constipation - past hx but ow soft regular stools  No A fib or A flutter  EMMI video sent to pt's e mail - pt declined

## 2018-09-05 ENCOUNTER — Ambulatory Visit: Payer: 59 | Admitting: Cardiovascular Disease

## 2018-09-05 ENCOUNTER — Encounter: Payer: Self-pay | Admitting: Cardiovascular Disease

## 2018-09-05 VITALS — BP 140/86 | HR 64 | Ht 66.0 in | Wt 172.1 lb

## 2018-09-05 DIAGNOSIS — I10 Essential (primary) hypertension: Secondary | ICD-10-CM

## 2018-09-05 DIAGNOSIS — R0789 Other chest pain: Secondary | ICD-10-CM | POA: Diagnosis not present

## 2018-09-05 DIAGNOSIS — R0602 Shortness of breath: Secondary | ICD-10-CM

## 2018-09-05 MED ORDER — NITROGLYCERIN 0.4 MG SL SUBL: 0.4000 mg | SUBLINGUAL_TABLET | SUBLINGUAL | 3 refills | Status: DC | PRN

## 2018-09-05 NOTE — Addendum Note (Signed)
Addended by: Aris Georgia, Vrinda Heckstall L on: 09/05/2018 09:46 AM   Modules accepted: Orders

## 2018-09-05 NOTE — Addendum Note (Signed)
Addended by: Aris Georgia, Chayson Charters L on: 09/05/2018 09:59 AM   Modules accepted: Orders

## 2018-09-05 NOTE — Patient Instructions (Addendum)
Medication Instructions:  Your physician has recommended you make the following change in your medication:   1-Take 1 NTG, under your tongue, while sitting. If no relief of pain may repeat NTG, one tab every 5 minutes up to 3 tablets total over 15 minutes. If no relief CALL 911. If you have dizziness/lightheadness while taking NTG, stop taking and call 911.   If you need a refill on your cardiac medications before your next appointment, please call your pharmacy.   Lab work:  If you have labs (blood work) drawn today and your tests are completely normal, you will receive your results only by: Marland Kitchen MyChart Message (if you have MyChart) OR . A paper copy in the mail If you have any lab test that is abnormal or we need to change your treatment, we will call you to review the results.  Testing/Procedures: Your physician has requested that you have an echocardiogram. Echocardiography is a painless test that uses sound waves to create images of your heart. It provides your doctor with information about the size and shape of your heart and how well your heart's chambers and valves are working. This procedure takes approximately one hour. There are no restrictions for this procedure.  Your physician has requested that you have an exercise tolerance test. For further information please visit [email or web address]. Please also follow instruction sheet, as given.  Follow-Up: At [facility], you and your health needs are our priority.  As part of our continuing mission to provide you with exceptional heart care, we have created designated Provider Care Teams.  These Care Teams include your primary Cardiologist (physician) and Advanced Practice Providers (APPs -  Physician Assistants and Nurse Practitioners) who all work together to provide you with the care you need, when you need it. You will need a follow up appointment in 3 months.  Please call our office 2 months in advance to schedule this  appointment.  You may see Jenkins Rouge, MD or one of the following Advanced Practice Providers on your designated Care Team:   Truitt Merle, NP Cecilie Kicks, NP . Kathyrn Drown, NP

## 2018-09-15 ENCOUNTER — Ambulatory Visit (AMBULATORY_SURGERY_CENTER): Payer: 59 | Admitting: Internal Medicine

## 2018-09-15 ENCOUNTER — Encounter: Payer: Self-pay | Admitting: Internal Medicine

## 2018-09-15 ENCOUNTER — Telehealth (HOSPITAL_COMMUNITY): Payer: Self-pay | Admitting: *Deleted

## 2018-09-15 VITALS — BP 107/74 | HR 55 | Temp 97.1°F | Resp 11 | Ht 66.0 in | Wt 172.0 lb

## 2018-09-15 DIAGNOSIS — D123 Benign neoplasm of transverse colon: Secondary | ICD-10-CM

## 2018-09-15 DIAGNOSIS — Z86010 Personal history of colonic polyps: Secondary | ICD-10-CM | POA: Diagnosis not present

## 2018-09-15 DIAGNOSIS — Z1211 Encounter for screening for malignant neoplasm of colon: Secondary | ICD-10-CM | POA: Diagnosis not present

## 2018-09-15 DIAGNOSIS — K635 Polyp of colon: Secondary | ICD-10-CM | POA: Diagnosis not present

## 2018-09-15 MED ORDER — SODIUM CHLORIDE 0.9 % IV SOLN: 500.0000 mL | INTRAVENOUS | Status: DC

## 2018-09-15 NOTE — Progress Notes (Signed)
No problems noted in the recovery room. maw 

## 2018-09-15 NOTE — Patient Instructions (Signed)
YOU HAD AN ENDOSCOPIC PROCEDURE TODAY AT THE [facility]:   Refer to the procedure report that was given to you for any specific questions about what was found during the examination.  If the procedure report does not answer your questions, please call your gastroenterologist to clarify.  If you requested that your care partner not be given the details of your procedure findings, then the procedure report has been included in a sealed envelope for you to review at your convenience later.  YOU SHOULD EXPECT: Some feelings of bloating in the abdomen. Passage of more gas than usual.  Walking can help get rid of the air that was put into your GI tract during the procedure and reduce the bloating. If you had a lower endoscopy (such as a colonoscopy or flexible sigmoidoscopy) you may notice spotting of blood in your stool or on the toilet paper. If you underwent a bowel prep for your procedure, you may not have a normal bowel movement for a few days.  Please Note:  You might notice some irritation and congestion in your nose or some drainage.  This is from the oxygen used during your procedure.  There is no need for concern and it should clear up in a day or so.  SYMPTOMS TO REPORT IMMEDIATELY:   Following lower endoscopy (colonoscopy or flexible sigmoidoscopy):  Excessive amounts of blood in the stool  Significant tenderness or worsening of abdominal pains  Swelling of the abdomen that is new, acute  Fever of 100F or higher   For urgent or emergent issues, a gastroenterologist can be reached at any hour by calling [phone].   DIET:  We do recommend a small meal at first, but then you may proceed to your regular diet.  Drink plenty of fluids but you should avoid alcoholic beverages for 24 hours.  ACTIVITY:  You should plan to take it easy for the rest of today and you should NOT DRIVE or use heavy machinery until tomorrow (because of the sedation medicines used during the test).     FOLLOW UP: Our staff will call the number listed on your records the next business day following your procedure to check on you and address any questions or concerns that you may have regarding the information given to you following your procedure. If we do not reach you, we will leave a message.  However, if you are feeling well and you are not experiencing any problems, there is no need to return our call.  We will assume that you have returned to your regular daily activities without incident.  If any biopsies were taken you will be contacted by phone or by letter within the next 1-3 weeks.  Please call us at [phone] if you have not heard about the biopsies in 3 weeks.    SIGNATURES/CONFIDENTIALITY: You and/or your care partner have signed paperwork which will be entered into your electronic medical record.  These signatures attest to the fact that that the information above on your After Visit Summary has been reviewed and is understood.  Full responsibility of the confidentiality of this discharge information lies with you and/or your care-partner.   Handout was given to your care partner on polyps. You may resume your current medications today. Await biopsy results. Please call if any questions or concerns.   

## 2018-09-15 NOTE — Progress Notes (Signed)
PT taken to PACU. Monitors in place. VSS. Report given to RN. 

## 2018-09-15 NOTE — Progress Notes (Signed)
Called to room to assist during endoscopic procedure.  Patient ID and intended procedure confirmed with present staff. Received instructions for my participation in the procedure from the performing physician.  

## 2018-09-15 NOTE — Op Note (Signed)
[place] Patient Name: Vickie Lane Procedure Date: 09/15/2018 1:20 PM MRN: [id number] Endoscopist: Jerene Bears , MD Age: 57 Referring MD:  Date of Birth: June 11, 1961 Gender: Female Account #: [id number] Procedure:                Colonoscopy Indications:              Surveillance: Personal history of adenomatous                            polyps on last colonoscopy 5 years ago Medicines:                Monitored Anesthesia Care Procedure:                Pre-Anesthesia Assessment:                           - Prior to the procedure, a History and Physical                            was performed, and patient medications and                            allergies were reviewed. The patient's tolerance of                            previous anesthesia was also reviewed. The risks                            and benefits of the procedure and the sedation                            options and risks were discussed with the patient.                            All questions were answered, and informed consent                            was obtained. Prior Anticoagulants: The patient has                            taken no previous anticoagulant or antiplatelet                            agents. ASA Grade Assessment: II - A patient with                            mild systemic disease. After reviewing the risks                            and benefits, the patient was deemed in                            satisfactory condition to undergo the procedure.  After obtaining informed consent, the colonoscope                            was passed under direct vision. Throughout the                            procedure, the patient's blood pressure, pulse, and                            oxygen saturations were monitored continuously. The                            Colonoscope was introduced through the anus and                            advanced to the cecum,  identified by appendiceal                            orifice and ileocecal valve. The colonoscopy was                            performed without difficulty. The patient tolerated                            the procedure well. The quality of the bowel                            preparation was good. The ileocecal valve,                            appendiceal orifice, and rectum were photographed. Scope In: 1:45:15 PM Scope Out: 1:57:07 PM Scope Withdrawal Time: 0 hours 8 minutes 22 seconds  Total Procedure Duration: 0 hours 11 minutes 52 seconds  Findings:                 The digital rectal exam was normal.                           A 3 mm polyp was found in the hepatic flexure. The                            polyp was sessile. The polyp was removed with a                            cold snare. Resection and retrieval were complete.                           The exam was otherwise without abnormality on                            direct and retroflexion views. Complications:            No immediate complications. Estimated Blood Loss:     Estimated blood loss was minimal. Impression:               -  One 3 mm polyp at the hepatic flexure, removed                            with a cold snare. Resected and retrieved.                           - The examination was otherwise normal on direct                            and retroflexion views. Recommendation:           - Patient has a contact number available for                            emergencies. The signs and symptoms of potential                            delayed complications were discussed with the                            patient. Return to normal activities tomorrow.                            Written discharge instructions were provided to the                            patient.                           - Resume previous diet.                           - Continue present medications.                           - Await  pathology results.                           - Repeat colonoscopy is recommended for                            surveillance. The colonoscopy date will be                            determined after pathology results from today's                            exam become available for review. Jerene Bears, MD 09/15/2018 2:00:42 PM This report has been signed electronically.

## 2018-09-15 NOTE — Progress Notes (Signed)
Pt's states no medical or surgical changes since previsit or office visit. 

## 2018-09-15 NOTE — Telephone Encounter (Signed)
Left message on voicemail in reference to upcoming appointment scheduled for 09/17/18. Phone number given for a call back so details instructions can be given. Claud Gowan, Ranae [place]ms

## 2018-09-16 ENCOUNTER — Telehealth: Payer: Self-pay

## 2018-09-16 ENCOUNTER — Encounter (HOSPITAL_COMMUNITY): Payer: 59

## 2018-09-16 ENCOUNTER — Other Ambulatory Visit (HOSPITAL_COMMUNITY): Payer: 59

## 2018-09-16 ENCOUNTER — Telehealth: Payer: Self-pay | Admitting: *Deleted

## 2018-09-16 ENCOUNTER — Telehealth (HOSPITAL_COMMUNITY): Payer: Self-pay | Admitting: *Deleted

## 2018-09-16 NOTE — Telephone Encounter (Signed)
Attempted to reach patient for post-procedure f/u call. No answer. Unable to leave message as patient's voicemail is not set up. Will attempt to call again later today.

## 2018-09-16 NOTE — Telephone Encounter (Signed)
Left message on voicemail of work #in reference to upcoming appointment scheduled for 09/17/18. Voicemail box not set up on cell # Phone number given for a call back so details instructions can be given. Helyn Schwan, Ranae Palms

## 2018-09-16 NOTE — Telephone Encounter (Signed)
Second call left message to call if questions or concerns.

## 2018-09-17 ENCOUNTER — Other Ambulatory Visit: Payer: Self-pay

## 2018-09-17 ENCOUNTER — Ambulatory Visit (HOSPITAL_COMMUNITY): Payer: 59 | Attending: Cardiovascular Disease

## 2018-09-17 ENCOUNTER — Ambulatory Visit (HOSPITAL_BASED_OUTPATIENT_CLINIC_OR_DEPARTMENT_OTHER): Payer: 59

## 2018-09-17 DIAGNOSIS — R0602 Shortness of breath: Secondary | ICD-10-CM

## 2018-09-17 DIAGNOSIS — I10 Essential (primary) hypertension: Secondary | ICD-10-CM | POA: Diagnosis not present

## 2018-09-17 DIAGNOSIS — R0789 Other chest pain: Secondary | ICD-10-CM | POA: Diagnosis not present

## 2018-09-17 LAB — MYOCARDIAL PERFUSION IMAGING
CHL CUP NUCLEAR SDS: 3
CHL CUP NUCLEAR SRS: 2
CHL CUP NUCLEAR SSS: 5
CSEPEW: 9.3 METS
Exercise duration (min): 7 min
Exercise duration (sec): 30 s
LVDIAVOL: 91 mL (ref 46–106)
LVSYSVOL: 41 mL
MPHR: 164 {beats}/min
NUC STRESS TID: 0.97
Peak HR: 150 {beats}/min
Percent HR: 91 %
RPE: 18
Rest HR: 57 {beats}/min

## 2018-09-17 MED ORDER — TECHNETIUM TC 99M TETROFOSMIN IV KIT
10.9000 | PACK | Freq: Once | INTRAVENOUS | Status: AC | PRN
  Administered 2018-09-17: 10.9 via INTRAVENOUS
  Filled 2018-09-17: qty 11

## 2018-09-17 MED ORDER — TECHNETIUM TC 99M TETROFOSMIN IV KIT
32.5000 | PACK | Freq: Once | INTRAVENOUS | Status: AC | PRN
  Administered 2018-09-17: 32.5 via INTRAVENOUS
  Filled 2018-09-17: qty 33

## 2018-09-22 ENCOUNTER — Encounter: Payer: Self-pay | Admitting: Internal Medicine

## 2018-12-01 ENCOUNTER — Ambulatory Visit: Payer: 59 | Admitting: Cardiovascular Disease

## 2018-12-18 ENCOUNTER — Ambulatory Visit (INDEPENDENT_AMBULATORY_CARE_PROVIDER_SITE_OTHER): Payer: No Typology Code available for payment source | Admitting: Cardiovascular Disease

## 2018-12-18 VITALS — BP 142/90 | HR 56 | Ht 66.0 in | Wt 181.0 lb

## 2018-12-18 DIAGNOSIS — I10 Essential (primary) hypertension: Secondary | ICD-10-CM

## 2018-12-18 NOTE — Patient Instructions (Signed)

## 2018-12-18 NOTE — Progress Notes (Signed)
Cardiology Office Note   Date:  12/18/2018   ID:  Shey, Yott 08/25/1961, MRN [id number]  PCP:  Kelton Pillar, MD  Cardiologist:   Jenkins Rouge, MD   No chief complaint on file.     History of Present Illness:  57 y.o. f/u stypical chest pain. HLD on statin, HTN and family history of CAD. 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 She has a big trip to [place] to sing latter this spring She is [employer] for choral group. Also going to [place] in summer   Past Medical History:  Diagnosis Date  . Allergy   . Arthritis    "back" (11/07/2012)  . Chronic lower back pain   . Complication of anesthesia    somthing made her itch  . Constipation   . GERD (gastroesophageal reflux disease)   . Heart murmur    "MVP" (`11/07/2012)  . History of blood transfusion 1990's   "? w/one of my deliveries" (11/07/2012)  . Hyperlipidemia    LDL  . Hypertension 04/03/10  . Mitral valve prolapse   . Seizures ([place]) 1991   "related to pregnancy; I had HELLP" (11/07/2012)- lastseizure was 1991 per pt   . Sickle cell trait ([place])   . Systemic lupus erythematosus ([place]) 04/03/10   joint pain  . Type II diabetes mellitus [facility]) October 2012   Diet and exercise.    Past Surgical History:  Procedure Laterality Date  . ABDOMINAL HYSTERECTOMY  1997  . CARPAL TUNNEL RELEASE  2008   "right w/fracture OR"  (11/07/2012)  . [place]  . COLONOSCOPY    . POLYPECTOMY    . POSTERIOR LUMBAR FUSION  11/07/2012   "L4-5" (11/07/2012)  . TRIGGER FINGER RELEASE Right 05/04/2016   Procedure: MINOR RELEASE TRIGGER FINGER/A-1 PULLEY;  Surgeon: Charlotte Crumb, MD;  Location: [facility];  Service: Orthopedics;  Laterality: Right;  . WRIST FRACTURE SURGERY  2008   "right" (11/07/2012)     Current Outpatient Medications  Medication Sig Dispense Refill  . amLODipine (NORVASC) 10 MG tablet Take 10 mg by mouth at bedtime.     Marland Kitchen aspirin EC 81 MG tablet Take 81 mg by mouth every morning.    . benazepril-hydrochlorthiazide (LOTENSIN HCT) 20-25 MG tablet TAKE 1/2 TAB BY MOUTH DAILY  3  . Cetirizine-Pseudoephedrine (ZYRTEC-D PO) Take 1 tablet by mouth daily as needed. For allergies    . cloNIDine (CATAPRES) 0.1 MG tablet Take 0.1 mg by mouth 2 (two) times daily.    Marland Kitchen estradiol (CLIMARA) 0.1 mg/24hr patch Place 1 patch (0.1 mg total) onto the skin once a week. Sunday 4 patch 11  . FREESTYLE LITE test strip   11  . glucose blood (FREESTYLE LITE) test strip     . Lancets (FREESTYLE) lancets   12  .  meloxicam (MOBIC) 7.5 MG tablet Take 7.5 mg by mouth daily as needed for pain.    . Multiple Vitamin (MULTIVITAMIN WITH MINERALS) TABS Take 1 tablet by mouth daily.    . naproxen (NAPROSYN) 500 MG tablet Take 1 tablet (500 mg total) by mouth 2 (two) times daily. 30 tablet 0  . simvastatin (ZOCOR) 20 MG tablet Take 20 mg by mouth daily at 6 PM.    . tinidazole (TINDAMAX) 500 MG tablet Take 2 tablets (1,000 mg total) by mouth daily with breakfast. 10 tablet 2  . valACYclovir (VALTREX) 500 MG tablet Take 1 tablet (500 mg total) by mouth daily. 30 tablet 11  . fluticasone (FLONASE) 50 MCG/ACT nasal spray Place 2 sprays into both nostrils daily for 10 days. 16 g 0  . nitroGLYCERIN (NITROSTAT) 0.4 MG SL tablet Place 1 tablet (0.4 mg total) under the tongue every 5 (five) minutes as needed for  chest pain. (Patient not taking: Reported on 09/15/2018) 25 tablet 3   No current facility-administered medications for this visit.     Allergies:   Ciprofloxacin; Codeine; Hydrocodone; Levofloxacin; and Ultram [tramadol hcl]    Social History:  The patient  reports that she has never smoked. She has never used smokeless tobacco. She reports current alcohol use. She reports that she does not use drugs.   Family History:  The patient's family history includes Heart attack in her father; Heart disease in an other family member; Hypertension in an other family member.    ROS:  Please see the history of present illness.   Otherwise, review of systems are positive for none.   All other systems are reviewed and negative.    PHYSICAL EXAM: VS:  BP (!) 142/90   Pulse (!) 56   Ht 5\' 6"  (1.676 m)   Wt 181 lb (82.1 kg)   BMI 29.21 kg/m  , BMI Body mass index is 29.21 kg/m. Affect appropriate Overweight female  HEENT: normal Neck supple with no adenopathy JVP normal no bruits no thyromegaly Lungs clear with no wheezing and good diaphragmatic motion Heart:  S1/S2 SEM RLSB  murmur, no rub, gallop or click PMI normal Abdomen: benighn, BS positve, no tenderness, no AAA no bruit.  No HSM or HJR Distal pulses intact with no bruits No edema Neuro non-focal Skin warm and dry No muscular weakness     EKG:  10/22/12  SR rate 61  Inferolateral T wave changes  02/28/16 DT tsyr 58 nonspecific ST changes  03/28/16 SR rate 60 inferolateral T wave changes 09/04/18 SR rate 64 LAE inferior lateral ST changes    Recent Labs: No results found for requested labs within last 8760 hours.    Lipid Panel    Component Value Date/Time   CHOL 170 03/29/2016 0847   TRIG 41 03/29/2016 0847   HDL 83 03/29/2016 0847   CHOLHDL 2.0 03/29/2016 0847   VLDL 8 03/29/2016 0847   LDLCALC 79 03/29/2016 0847      Wt Readings from Last 3 Encounters:  12/18/18 181 lb (82.1 kg)  09/17/18 172 lb (78 kg)    09/15/18 172 lb (78 kg)      Other studies Reviewed: Additional studies/ records that were reviewed today include: cardiac CT BP monitor notes Dr Laurann Montana and lab work .    ASSESSMENT AND PLAN:  1.  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 HTN:  Follow home readings increase lotensin to 40 mg per primary if elevated Start taking norvasc in afternoon  3. DM:  fructosamine normal Still issues with diet /fu Dr Laurann Montana she will discuss sendexa as  Weight loss strategy  4. Dyspnea:  Lungs clear normal echo 09/17/18 EF 27-61% normal diastolic function  No bad valves and normal estimated PA pressure      Current medicines are reviewed at length with the patient today.  The patient does not have concerns regarding medicines.  The following changes have been made:  no change  Labs/ tests ordered today include: None   No orders of the defined types were placed in this encounter.    Disposition:   FU with me in a year      Signed, Jenkins Rouge, MD  12/18/2018 9:42 AM    [facility] [address], [place], [place]  [address] Phone: [phone]; Fax: [phone]

## 2019-04-28 ENCOUNTER — Other Ambulatory Visit: Payer: Self-pay | Admitting: Family Medicine

## 2019-04-28 ENCOUNTER — Other Ambulatory Visit: Payer: Self-pay

## 2019-04-28 ENCOUNTER — Ambulatory Visit
Admission: RE | Admit: 2019-04-28 | Discharge: 2019-04-28 | Disposition: A | Discharge status: Home / Self Care | Payer: No Typology Code available for payment source | Source: Ambulatory Visit | Attending: Family Medicine | Admitting: Family Medicine

## 2019-04-28 DIAGNOSIS — R52 Pain, unspecified: Secondary | ICD-10-CM

## 2019-06-18 ENCOUNTER — Other Ambulatory Visit: Payer: Self-pay | Admitting: Family Medicine

## 2019-06-19 ENCOUNTER — Other Ambulatory Visit: Payer: Self-pay | Admitting: Family Medicine

## 2019-06-19 DIAGNOSIS — N6320 Unspecified lump in the left breast, unspecified quadrant: Secondary | ICD-10-CM

## 2019-07-14 ENCOUNTER — Other Ambulatory Visit: Payer: Self-pay

## 2019-07-14 ENCOUNTER — Ambulatory Visit (INDEPENDENT_AMBULATORY_CARE_PROVIDER_SITE_OTHER): Payer: No Typology Code available for payment source | Admitting: Obstetrics & Gynecology

## 2019-07-14 ENCOUNTER — Encounter: Payer: Self-pay | Admitting: Obstetrics & Gynecology

## 2019-07-14 VITALS — BP 121/79 | HR 65 | Temp 97.7°F | Ht 66.0 in | Wt 190.0 lb

## 2019-07-14 DIAGNOSIS — Z01419 Encounter for gynecological examination (general) (routine) without abnormal findings: Secondary | ICD-10-CM | POA: Diagnosis not present

## 2019-07-14 DIAGNOSIS — N898 Other specified noninflammatory disorders of vagina: Secondary | ICD-10-CM

## 2019-07-14 DIAGNOSIS — A6004 Herpesviral vulvovaginitis: Secondary | ICD-10-CM

## 2019-07-14 DIAGNOSIS — Z113 Encounter for screening for infections with a predominantly sexual mode of transmission: Secondary | ICD-10-CM | POA: Diagnosis not present

## 2019-07-14 DIAGNOSIS — N951 Menopausal and female climacteric states: Secondary | ICD-10-CM

## 2019-07-14 MED ORDER — ESTRADIOL 0.1 MG/24HR TD PTWK: 0.1000 mg | MEDICATED_PATCH | TRANSDERMAL | 11 refills | Status: DC

## 2019-07-14 MED ORDER — VALACYCLOVIR HCL 500 MG PO TABS: 500.0000 mg | ORAL_TABLET | Freq: Every day | ORAL | 11 refills | Status: DC

## 2019-07-14 NOTE — Progress Notes (Signed)
Patient presents for Annual Exam.   Last pap: 08/27/2011 WNL  Mammogram:11/29/2017 WNL      STD Screening: Desires Full Panel  Abdominal HYST 1997 CC: None pt states she needs** refill on estradiol patches

## 2019-07-14 NOTE — Progress Notes (Addendum)
Subjective:    Vickie Lane is a 58 y.o. single P3 (with 6 grandkids) who presents for an annual exam. The patient has no complaints today except for a 2 year h/o a small palpable lesion on her inner right butt cheek. She wants STI testing. She wants a refill of her Climara and valtrex. The patient is sexually active. GYN screening history: last pap: was normal. The patient wears seatbelts: yes. The patient participates in regular exercise: yes. Has the patient ever been transfused or tattooed?: yes. The patient reports that there is not domestic violence in her life.   Menstrual History: OB History    Gravida  6   Para  3   Term  1   Preterm  2   AB  3   Living  3     SAB  1   TAB  2   Ectopic      Multiple      Live Births  3           Menarche age: 77 No LMP recorded. Patient has had a hysterectomy.    The following portions of the patient's history were reviewed and updated as appropriate: allergies, current medications, past family history, past medical history, past social history, past surgical history and problem list.  Review of Systems Pertinent items are noted in HPI.   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 She plans to get her flu vaccine later. She will get a mammogram 11/20. FH- no breast/+ uterine cancer in paternal aunt/no colon cancer S/p colonoscopy x 2- needs another one in 5 years Dr. Kelton Pillar is her primary care provider.   Objective:    BP 121/79   Pulse 65   Temp 97.7 F (36.5 C) (Oral)   Ht 5\' 6"  (1.676 m)   Wt 190 lb (86.2 kg)   BMI 30.67 kg/m   General Appearance:    Alert, cooperative, no distress, appears stated age  Head:    Normocephalic, without obvious abnormality, atraumatic  Eyes:    PERRL, conjunctiva/corneas clear, EOM's intact, fundi    benign, both eyes  Ears:    Normal TM's and external ear canals, both ears  Nose:   Nares normal, septum midline, mucosa normal, no drainage    or sinus tenderness   Throat:   Lips, mucosa, and tongue normal; teeth and gums normal  Neck:   Supple, symmetrical, trachea midline, no adenopathy;    thyroid:  no enlargement/tenderness/nodules; no carotid   bruit or JVD  Back:     Symmetric, no curvature, ROM normal, no CVA tenderness  Lungs:     Clear to auscultation bilaterally, respirations unlabored  Chest Wall:    No tenderness or deformity   Heart:    Regular rate and rhythm, S1 and S2 normal, no rub or gallop, 5/6 SEM (She knows about this)  Breast Exam:    No tenderness, masses, or nipple abnormality  Abdomen:     Soft, non-tender, bowel sounds active all four quadrants,    no masses, no organomegaly  Genitalia:    Normal female without lesion, discharge or tenderness, spec exam normal, 8 mm raised lesion on her right buttock     Extremities:   Extremities normal, atraumatic, no cyanosis or edema  Pulses:   2+ and symmetric all extremities  Skin:   Skin color, texture, turgor normal, no rashes or lesions  Lymph nodes:   Cervical, supraclavicular, and axillary nodes normal  Neurologic:  CNII-XII intact, normal strength, sensation and reflexes    throughout  .    Assessment:    Healthy female exam.   Right buttock lesion   Plan:     Wet prep. and STI testing sent She wants to schedule a vist

## 2019-07-15 LAB — HIV ANTIBODY (ROUTINE TESTING W REFLEX): HIV Screen 4th Generation wRfx: NONREACTIVE

## 2019-07-15 LAB — HEPATITIS B SURFACE ANTIGEN: Hepatitis B Surface Ag: NEGATIVE

## 2019-07-15 LAB — RPR: RPR Ser Ql: NONREACTIVE

## 2019-07-15 LAB — HEPATITIS C ANTIBODY: Hep C Virus Ab: 0.1 s/co ratio (ref 0.0–0.9)

## 2019-07-17 LAB — CERVICOVAGINAL ANCILLARY ONLY
Bacterial vaginitis: NEGATIVE
Candida vaginitis: NEGATIVE
Chlamydia: NEGATIVE
Neisseria Gonorrhea: NEGATIVE
Trichomonas: NEGATIVE

## 2019-07-30 ENCOUNTER — Ambulatory Visit: Payer: No Typology Code available for payment source | Admitting: Family Medicine

## 2019-07-30 ENCOUNTER — Encounter: Payer: Self-pay | Admitting: Family Medicine

## 2019-07-30 ENCOUNTER — Other Ambulatory Visit: Payer: Self-pay

## 2019-07-30 ENCOUNTER — Other Ambulatory Visit (HOSPITAL_COMMUNITY)
Admission: RE | Admit: 2019-07-30 | Discharge: 2019-07-30 | Disposition: A | Discharge status: Home / Self Care | Payer: No Typology Code available for payment source | Source: Ambulatory Visit | Attending: Family Medicine | Admitting: Family Medicine

## 2019-07-30 VITALS — BP 132/83 | HR 63 | Ht 66.0 in | Wt 186.9 lb

## 2019-07-30 DIAGNOSIS — L989 Disorder of the skin and subcutaneous tissue, unspecified: Secondary | ICD-10-CM

## 2019-07-30 DIAGNOSIS — D045 Carcinoma in situ of skin of trunk: Secondary | ICD-10-CM | POA: Diagnosis not present

## 2019-07-30 NOTE — Progress Notes (Signed)
GYN presents for Biopsy.  Reports no concerns today.

## 2019-07-30 NOTE — Progress Notes (Signed)
Patient here for skin biopsy. Local anesthesia with 2% lidocaine with epi - 55mL SKin cleaned with betadine Eliptical incision made: 4cmx2cm.  Incision closed with 4.0 vicryl subcutaneous stitch.  Steri strips and dressing placed with 2x2 and tape.  F/u in 1-2 weeks.

## 2019-07-30 NOTE — Patient Instructions (Signed)
Please keep area clean and dry. Keep dressing on tonight. May shower tomorrow - no scrubbing over area. Please call if becomes red, increased pain, drainage of any sort.

## 2019-08-04 ENCOUNTER — Other Ambulatory Visit: Payer: Self-pay

## 2019-08-04 ENCOUNTER — Encounter: Payer: Self-pay | Admitting: Obstetrics and Gynecology

## 2019-08-04 ENCOUNTER — Ambulatory Visit: Payer: No Typology Code available for payment source | Admitting: Obstetrics and Gynecology

## 2019-08-04 VITALS — BP 109/70 | Wt 183.9 lb

## 2019-08-04 DIAGNOSIS — D099 Carcinoma in situ, unspecified: Secondary | ICD-10-CM | POA: Diagnosis not present

## 2019-08-04 NOTE — Progress Notes (Signed)
GYNECOLOGY OFFICE FOLLOW UP NOTE  History:  58 y.o. [id number] here today for follow up for biopsy/removal of skin lesion on right buttock.  Reports it is healing well, denies any discharge. Only reports some tenderness.  Past Medical History:  Diagnosis Date  . Allergy   . Arthritis    "back" (11/07/2012)  . Chronic lower back pain   . Complication of anesthesia    somthing made her itch  . Constipation   . GERD (gastroesophageal reflux disease)   . Heart murmur    "MVP" (`11/07/2012)  . History of blood transfusion 1990's   "? w/one of my deliveries" (11/07/2012)  . Hyperlipidemia    LDL  . Hypertension 04/03/10  . Mitral valve prolapse   . Seizures ([place]) 1991   "related to pregnancy; I had HELLP" (11/07/2012)- lastseizure was 1991 per pt   . Sickle cell trait ([place])   . Systemic lupus erythematosus ([place]) 04/03/10   joint pain  . Type II diabetes mellitus [facility]) October 2012   Diet and exercise.    Past Surgical History:  Procedure Laterality Date  . ABDOMINAL HYSTERECTOMY  1997  . CARPAL TUNNEL RELEASE  2008   "right w/fracture OR" (11/07/2012)  . [place]  . COLONOSCOPY    . POLYPECTOMY    . POSTERIOR LUMBAR FUSION  11/07/2012   "L4-5" (11/07/2012)  . TRIGGER FINGER RELEASE Right 05/04/2016   Procedure: MINOR RELEASE TRIGGER FINGER/A-1 PULLEY;  Surgeon: Charlotte Crumb, MD;  Location: [place];  Service: Orthopedics;  Laterality: Right;  . WRIST FRACTURE SURGERY  2008   "right" (11/07/2012)     Current Outpatient Medications:  .  amLODipine (NORVASC) 10 MG tablet, Take 10 mg by mouth at bedtime. , Disp: , Rfl:  .  aspirin EC 81 MG tablet, Take 81 mg by mouth every morning., Disp: , Rfl:  .  benazepril-hydrochlorthiazide (LOTENSIN HCT) 20-25 MG tablet, TAKE 1/2 TAB BY MOUTH DAILY, Disp: , Rfl: 3 .  Cetirizine-Pseudoephedrine (ZYRTEC-D PO), Take 1 tablet by mouth daily as needed. For allergies, Disp: , Rfl:  .  cloNIDine  (CATAPRES) 0.1 MG tablet, Take 0.1 mg by mouth 2 (two) times daily., Disp: , Rfl:  .  estradiol (CLIMARA) 0.1 mg/24hr patch, Place 1 patch (0.1 mg total) onto the skin once a week. Sunday, Disp: 4 patch, Rfl: 11 .  FREESTYLE LITE test strip, , Disp: , Rfl: 11 .  glucose blood (FREESTYLE LITE) test strip, , Disp: , Rfl:  .  Lancets (FREESTYLE) lancets, , Disp: , Rfl: 12 .  Multiple Vitamin (MULTIVITAMIN WITH MINERALS) TABS, Take 1 tablet by mouth daily., Disp: , Rfl:  .  naproxen (NAPROSYN) 500 MG tablet, Take 1 tablet (500 mg total) by mouth 2 (two) times daily., Disp: 30 tablet, Rfl: 0 .  simvastatin (ZOCOR) 20 MG tablet, Take 20 mg by mouth daily at 6 PM., Disp: , Rfl:  .  valACYclovir (VALTREX) 500 MG tablet, Take 1 tablet (500 mg total) by mouth daily., Disp: 30 tablet, Rfl: 11 .  fluticasone (FLONASE) 50 MCG/ACT nasal spray, Place 2 sprays into both nostrils daily for 10 days., Disp: 16 g, Rfl: 0 .  meloxicam (MOBIC) 7.5 MG tablet, Take 7.5 mg by mouth daily as needed for pain., Disp: , Rfl:  .  nitroGLYCERIN (NITROSTAT) 0.4 MG SL tablet, Place 1 tablet (0.4 mg total) under the tongue every 5 (five) minutes as needed for chest pain. (Patient not taking: Reported on  09/15/2018), Disp: 25 tablet, Rfl: 3 .  tinidazole (TINDAMAX) 500 MG tablet, Take 2 tablets (1,000 mg total) by mouth daily with breakfast. (Patient not taking: Reported on 07/30/2019), Disp: 10 tablet, Rfl: 2  The following portions of the patient's history were reviewed and updated as appropriate: allergies, current medications, past family history, past medical history, past social history, past surgical history and problem list.   Review of Systems:  Pertinent items noted in HPI and remainder of comprehensive ROS otherwise negative.   Objective:  Physical Exam BP 109/70   Wt 183 lb 14.4 oz (83.4 kg)   BMI 29.68 kg/m  CONSTITUTIONAL: Well-developed, well-nourished female in no acute distress.  HENT:  Normocephalic,  atraumatic. External right and left ear normal. Oropharynx is clear and moist EYES: Conjunctivae and EOM are normal. Pupils are equal, round, and reactive to light. No scleral icterus.  NECK: Normal range of motion, supple, no masses SKIN: Skin is warm and dry. No rash noted. Not diaphoretic. No erythema. No pallor. NEUROLOGIC: Alert and oriented to person, place, and time. Normal reflexes, muscle tone coordination. No cranial nerve deficit noted. PSYCHIATRIC: Normal mood and affect. Normal behavior. Normal judgment and thought content. CARDIOVASCULAR: Normal heart rate noted RESPIRATORY: Effort normal, no problems with respiration noted ABDOMEN: Soft, no distention noted.   PELVIC: Normal appearing external genitalia; right incision healing well, well approximated with no erythema, induration, tenderness, swelling. MUSCULOSKELETAL: Normal range of motion. No edema noted.  Exam done with chaperone present.  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 There is associated full thickness atypia of the epidermis. No diagnostic evidence of dermal infiltration is identified. This is a pigmented variant of Bowen' s disease, pigmented squamous cell carcinoma in situ. The lesion extends to the peripheral margin of the specimen. Melissa  Assessment & Plan:   1. 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 Reviewed primary treatment is excision, reviewed she will need to have revision given the extension to margin. This will be done by dermatology.  - Patient has dermatologist, unsure if they accept her new insurance. She will contact that office and see them, she will let us know if she needs a new referral to see that  office or if she needs to establish care with another office - answered all questions, patient verbalizes understanding and need for follow up   Routine preventative health maintenance measures emphasized. Please refer to After Visit Summary for other counseling recommendations.   Return if symptoms worsen or fail to improve.  Total face-to-face time with patient: 15 minutes. Over 50% of encounter was spent on counseling and coordination of care.  Feliz Beam, M.D. Attending [facility] for [employer] manager)

## 2019-08-04 NOTE — Progress Notes (Signed)
Pt is here for F/U from skin biopsy on 07/30/19. Pt reports she is doing well, no questions or concerns.

## 2019-09-03 ENCOUNTER — Ambulatory Visit
Admission: RE | Admit: 2019-09-03 | Discharge: 2019-09-03 | Disposition: A | Discharge status: Home / Self Care | Payer: No Typology Code available for payment source | Source: Ambulatory Visit | Attending: Family Medicine | Admitting: Family Medicine

## 2019-09-03 ENCOUNTER — Other Ambulatory Visit: Payer: Self-pay

## 2019-09-03 DIAGNOSIS — N6320 Unspecified lump in the left breast, unspecified quadrant: Secondary | ICD-10-CM

## 2020-05-09 ENCOUNTER — Other Ambulatory Visit (HOSPITAL_COMMUNITY): Payer: Self-pay | Admitting: Family Medicine

## 2020-08-01 ENCOUNTER — Other Ambulatory Visit (HOSPITAL_COMMUNITY)
Admission: RE | Admit: 2020-08-01 | Discharge: 2020-08-01 | Disposition: A | Discharge status: Home / Self Care | Payer: No Typology Code available for payment source | Source: Ambulatory Visit | Attending: Obstetrics and Gynecology | Admitting: Obstetrics and Gynecology

## 2020-08-01 ENCOUNTER — Other Ambulatory Visit: Payer: Self-pay | Admitting: Obstetrics and Gynecology

## 2020-08-01 ENCOUNTER — Ambulatory Visit (INDEPENDENT_AMBULATORY_CARE_PROVIDER_SITE_OTHER): Payer: No Typology Code available for payment source | Admitting: Obstetrics and Gynecology

## 2020-08-01 ENCOUNTER — Other Ambulatory Visit: Payer: Self-pay

## 2020-08-01 ENCOUNTER — Encounter: Payer: Self-pay | Admitting: Obstetrics and Gynecology

## 2020-08-01 VITALS — BP 149/90 | HR 67 | Wt 174.0 lb

## 2020-08-01 DIAGNOSIS — Z01419 Encounter for gynecological examination (general) (routine) without abnormal findings: Secondary | ICD-10-CM | POA: Insufficient documentation

## 2020-08-01 DIAGNOSIS — N951 Menopausal and female climacteric states: Secondary | ICD-10-CM

## 2020-08-01 DIAGNOSIS — A6004 Herpesviral vulvovaginitis: Secondary | ICD-10-CM | POA: Diagnosis not present

## 2020-08-01 MED ORDER — VALACYCLOVIR HCL 500 MG PO TABS: 500.0000 mg | ORAL_TABLET | Freq: Every day | ORAL | 11 refills | Status: DC

## 2020-08-01 MED ORDER — ESTRADIOL 0.1 MG/24HR TD PTWK: 0.1000 mg | MEDICATED_PATCH | TRANSDERMAL | 11 refills | Status: DC

## 2020-08-01 NOTE — Progress Notes (Signed)
Subjective:     Vickie Lane is a 59 y.o. female P3 postmenopausal who is here for a comprehensive physical exam. The patient reports no problems. Patient has not been sexually active this year. She reports a normal physical with PCP last month. Patient denies pelvic pain or abnormal discharge. She denies vaginal bleeding or urinary incontinence. Patient reports the presence of a growth near previous buttock biopsy.  Past Medical History:  Diagnosis Date  . Allergy   . Arthritis    "back" (11/07/2012)  . Chronic lower back pain   . Complication of anesthesia    somthing made her itch  . Constipation   . GERD (gastroesophageal reflux disease)   . Heart murmur    "MVP" (`11/07/2012)  . History of blood transfusion 1990's   "? w/one of my deliveries" (11/07/2012)  . Hyperlipidemia    LDL  . Hypertension 04/03/10  . Mitral valve prolapse   . Seizures ([place]) 1991   "related to pregnancy; I had HELLP" (11/07/2012)- lastseizure was 1991 per pt   . Sickle cell trait ([place])   . Systemic lupus erythematosus ([place]) 04/03/10   joint pain  . Type II diabetes mellitus [facility]) October 2012   Diet and exercise.   Past Surgical History:  Procedure Laterality Date  . ABDOMINAL HYSTERECTOMY  1997  . CARPAL TUNNEL RELEASE  2008   "right w/fracture OR" (11/07/2012)  . [place]  . COLONOSCOPY    . POLYPECTOMY    . POSTERIOR LUMBAR FUSION  11/07/2012   "L4-5" (11/07/2012)  . TRIGGER FINGER RELEASE Right 05/04/2016   Procedure: MINOR RELEASE TRIGGER FINGER/A-1 PULLEY;  Surgeon: Charlotte Crumb, MD;  Location: [facility];  Service: Orthopedics;  Laterality: Right;  . WRIST FRACTURE SURGERY  2008   "right" (11/07/2012)   Family History  Problem Relation Age of Onset  . Heart attack Father   . Heart disease Other   . Hypertension Other   . Breast cancer Paternal Aunt   . Colon cancer Neg Hx   . Colon polyps Neg Hx   . Rectal cancer Neg Hx   .  Stomach cancer Neg Hx   . Esophageal cancer Neg Hx     Social History   Socioeconomic History  . Marital status: Single    Spouse name: Not on file  . Number of children: Not on file  . Years of education: Not on file  . Highest education level: Not on file  Occupational History  . Not on file  Tobacco Use  . Smoking status: Never Smoker  . Smokeless tobacco: Never Used  Vaping Use  . Vaping Use: Never used  Substance and Sexual Activity  . Alcohol use: Yes    Comment: 11/07/2012 "once/month might have a glass of wine"  . Drug use: No  . Sexual activity: Yes    Birth control/protection: Surgical  Other Topics Concern  . Not on file  Social History Narrative  . Not on file   Social Determinants of Health   Financial Resource Strain:   . Difficulty of Paying Living Expenses: Not on file  Food Insecurity:   . Worried About Charity fundraiser in the Last Year: Not on file  . Ran Out of Food in the Last Year: Not on file  Transportation Needs:   . Lack of Transportation (Medical): Not on file  . Lack of Transportation (Non-Medical): Not on file  Physical Activity:   . Days of Exercise  per Week: Not on file  . Minutes of Exercise per Session: Not on file  Stress:   . Feeling of Stress : Not on file  Social Connections:   . Frequency of Communication with Friends and Family: Not on file  . Frequency of Social Gatherings with Friends and Family: Not on file  . Attends Religious Services: Not on file  . Active Member of Clubs or Organizations: Not on file  . Attends Archivist Meetings: Not on file  . Marital Status: Not on file  Intimate Partner Violence:   . Fear of Current or Ex-Partner: Not on file  . Emotionally Abused: Not on file  . Physically Abused: Not on file  . Sexually Abused: Not on file   Health Maintenance  Topic Date Due  . PNEUMOCOCCAL POLYSACCHARIDE VACCINE AGE 10-64 HIGH RISK  Never done  . FOOT EXAM  Never done  . OPHTHALMOLOGY EXAM   Never done  . COVID-19 Vaccine (1) Never done  . TETANUS/TDAP  Never done  . HEMOGLOBIN A1C  09/29/2016  . INFLUENZA VACCINE  06/19/2020  . MAMMOGRAM  09/02/2020  . COLONOSCOPY  09/15/2028  . Hepatitis C Screening  Completed  . HIV Screening  Completed  . PAP SMEAR-Modifier  Discontinued       Review of Systems Pertinent items noted in HPI and remainder of comprehensive ROS otherwise negative.   Objective:  Blood pressure (!) 149/90, pulse 67, weight 174 lb (78.9 kg).     GENERAL: Well-developed, well-nourished female in no acute distress.  HEENT: Normocephalic, atraumatic. Sclerae anicteric.  NECK: Supple. Normal thyroid.  LUNGS: Clear to auscultation bilaterally.  HEART: Regular rate and rhythm. BREASTS: Symmetric in size. No palpable masses or lymphadenopathy, skin changes, or nipple drainage. ABDOMEN: Soft, nontender, nondistended. No organomegaly. PELVIC: Normal external female genitalia. Small 0.5 cm skin lesion approximately 2 cm above previous biopsy site on right buttock. Lesion is soft and non tender to touch. Vagina is pink and rugated.  Normal discharge.Vaginal vault intact. No adnexal mass or tenderness. EXTREMITIES: No cyanosis, clubbing, or edema, 2+ distal pulses.    Assessment:    Healthy female exam.      Plan:    STI screening per patient request Refill on CLimara and valtrex provided Patient to follow up with dermatologist as scheduled next week for vulva biospy Mammogram scheduled next month RTC in 1 year or prn See After Visit Summary for Counseling Recommendations

## 2020-08-01 NOTE — Progress Notes (Addendum)
GYN presents for AEX.  She Hysterectomy in 1997. She need refills on Valtrex and Climara.  C/o growth on right labia.  Denies pain or pus, fever, chills.  Next Mammogram 09/02/20.

## 2020-08-02 ENCOUNTER — Other Ambulatory Visit (HOSPITAL_COMMUNITY): Payer: Self-pay | Admitting: Family Medicine

## 2020-08-02 LAB — CERVICOVAGINAL ANCILLARY ONLY
Bacterial Vaginitis (gardnerella): NEGATIVE
Candida Glabrata: NEGATIVE
Candida Vaginitis: NEGATIVE
Chlamydia: NEGATIVE
Comment: NEGATIVE
Comment: NEGATIVE
Comment: NEGATIVE
Comment: NEGATIVE
Comment: NEGATIVE
Comment: NORMAL
Neisseria Gonorrhea: NEGATIVE
Trichomonas: NEGATIVE

## 2020-08-02 LAB — HEPATITIS B SURFACE ANTIGEN: Hepatitis B Surface Ag: NEGATIVE

## 2020-08-02 LAB — HEPATITIS C ANTIBODY: Hep C Virus Ab: <0.1 s/co ratio (ref 0.0–0.9)

## 2020-08-02 LAB — HIV ANTIBODY (ROUTINE TESTING W REFLEX): HIV Screen 4th Generation wRfx: NONREACTIVE

## 2020-08-02 LAB — RPR: RPR Ser Ql: NONREACTIVE

## 2020-11-10 ENCOUNTER — Other Ambulatory Visit (HOSPITAL_COMMUNITY): Payer: Self-pay | Admitting: Family Medicine

## 2020-11-24 ENCOUNTER — Other Ambulatory Visit: Payer: No Typology Code available for payment source

## 2020-12-28 ENCOUNTER — Other Ambulatory Visit (HOSPITAL_COMMUNITY): Payer: Self-pay | Admitting: Family Medicine

## 2021-02-10 ENCOUNTER — Other Ambulatory Visit (HOSPITAL_BASED_OUTPATIENT_CLINIC_OR_DEPARTMENT_OTHER): Payer: Self-pay

## 2021-02-20 ENCOUNTER — Other Ambulatory Visit (HOSPITAL_COMMUNITY): Payer: Self-pay

## 2021-02-20 MED ORDER — CLONIDINE HCL 0.2 MG PO TABS
ORAL_TABLET | ORAL | 12 refills | Status: DC
  Filled 2021-02-20: qty 60, 30d supply, fill #0
  Filled 2021-04-12: qty 60, 30d supply, fill #1
  Filled 2021-05-16: qty 60, 30d supply, fill #2
  Filled 2021-06-28: qty 60, 30d supply, fill #3
  Filled 2021-07-21: qty 60, 30d supply, fill #4

## 2021-02-27 ENCOUNTER — Other Ambulatory Visit (HOSPITAL_COMMUNITY): Payer: Self-pay

## 2021-02-27 MED FILL — Olmesartan Medoxomil Tab 40 MG: ORAL | 30 days supply | Qty: 30 | Fill #0 | Status: AC

## 2021-02-28 ENCOUNTER — Other Ambulatory Visit (HOSPITAL_COMMUNITY): Payer: Self-pay

## 2021-03-09 ENCOUNTER — Other Ambulatory Visit (HOSPITAL_COMMUNITY): Payer: Self-pay

## 2021-03-09 MED FILL — Valacyclovir HCl Tab 500 MG: ORAL | 30 days supply | Qty: 30 | Fill #0 | Status: AC

## 2021-03-10 ENCOUNTER — Other Ambulatory Visit (HOSPITAL_COMMUNITY): Payer: Self-pay

## 2021-03-28 ENCOUNTER — Other Ambulatory Visit (HOSPITAL_COMMUNITY): Payer: Self-pay

## 2021-03-28 MED FILL — Olmesartan Medoxomil Tab 40 MG: ORAL | 30 days supply | Qty: 30 | Fill #1 | Status: AC

## 2021-04-10 ENCOUNTER — Other Ambulatory Visit (HOSPITAL_COMMUNITY): Payer: Self-pay

## 2021-04-10 MED ORDER — ESTRADIOL 0.1 MG/24HR TD PTWK
MEDICATED_PATCH | TRANSDERMAL | 8 refills | Status: DC
  Filled 2021-04-10: qty 4, 28d supply, fill #0
  Filled 2021-06-03: qty 4, 28d supply, fill #1
  Filled 2021-07-01: qty 4, 28d supply, fill #2

## 2021-04-11 ENCOUNTER — Other Ambulatory Visit (HOSPITAL_COMMUNITY): Payer: Self-pay

## 2021-04-12 ENCOUNTER — Other Ambulatory Visit (HOSPITAL_COMMUNITY): Payer: Self-pay

## 2021-04-12 MED FILL — Valacyclovir HCl Tab 500 MG: ORAL | 30 days supply | Qty: 30 | Fill #1 | Status: AC

## 2021-04-27 ENCOUNTER — Other Ambulatory Visit (HOSPITAL_COMMUNITY): Payer: Self-pay

## 2021-04-28 ENCOUNTER — Other Ambulatory Visit (HOSPITAL_COMMUNITY): Payer: Self-pay

## 2021-04-28 MED ORDER — OLMESARTAN MEDOXOMIL 40 MG PO TABS
40.0000 mg | ORAL_TABLET | Freq: Every day | ORAL | 3 refills | Status: DC
  Filled 2021-04-28: qty 30, 30d supply, fill #0
  Filled 2021-06-05: qty 30, 30d supply, fill #1
  Filled 2021-06-29: qty 30, 30d supply, fill #2

## 2021-04-28 MED ORDER — OLMESARTAN MEDOXOMIL 40 MG PO TABS
ORAL_TABLET | ORAL | 1 refills | Status: DC
  Filled 2021-04-28: qty 90, 90d supply, fill #0

## 2021-05-07 MED FILL — Simvastatin Tab 20 MG: ORAL | 90 days supply | Qty: 90 | Fill #0 | Status: AC

## 2021-05-08 ENCOUNTER — Other Ambulatory Visit (HOSPITAL_COMMUNITY): Payer: Self-pay

## 2021-05-16 ENCOUNTER — Other Ambulatory Visit (HOSPITAL_COMMUNITY): Payer: Self-pay

## 2021-05-16 MED FILL — Amlodipine Besylate Tab 10 MG (Base Equivalent): ORAL | 90 days supply | Qty: 90 | Fill #0 | Status: AC

## 2021-05-16 MED FILL — Valacyclovir HCl Tab 500 MG: ORAL | 30 days supply | Qty: 30 | Fill #2 | Status: AC

## 2021-05-25 ENCOUNTER — Encounter (INDEPENDENT_AMBULATORY_CARE_PROVIDER_SITE_OTHER): Payer: Self-pay

## 2021-06-05 ENCOUNTER — Other Ambulatory Visit (HOSPITAL_COMMUNITY): Payer: Self-pay

## 2021-06-06 ENCOUNTER — Other Ambulatory Visit (HOSPITAL_COMMUNITY): Payer: Self-pay

## 2021-06-09 ENCOUNTER — Other Ambulatory Visit: Payer: Self-pay | Admitting: Family Medicine

## 2021-06-09 DIAGNOSIS — Z1231 Encounter for screening mammogram for malignant neoplasm of breast: Secondary | ICD-10-CM

## 2021-06-14 ENCOUNTER — Other Ambulatory Visit: Payer: Self-pay

## 2021-06-14 ENCOUNTER — Ambulatory Visit
Admission: RE | Admit: 2021-06-14 | Discharge: 2021-06-14 | Disposition: A | Discharge status: Home / Self Care | Payer: No Typology Code available for payment source | Source: Ambulatory Visit | Attending: Family Medicine | Admitting: Family Medicine

## 2021-06-14 DIAGNOSIS — Z1231 Encounter for screening mammogram for malignant neoplasm of breast: Secondary | ICD-10-CM

## 2021-06-29 ENCOUNTER — Other Ambulatory Visit (HOSPITAL_COMMUNITY): Payer: Self-pay

## 2021-07-01 ENCOUNTER — Other Ambulatory Visit (HOSPITAL_COMMUNITY): Payer: Self-pay

## 2021-07-01 MED FILL — Valacyclovir HCl Tab 500 MG: ORAL | 30 days supply | Qty: 30 | Fill #3 | Status: AC

## 2021-07-03 ENCOUNTER — Other Ambulatory Visit (HOSPITAL_COMMUNITY): Payer: Self-pay

## 2021-07-03 MED ORDER — AMLODIPINE BESYLATE 10 MG PO TABS
ORAL_TABLET | ORAL | 3 refills | Status: DC
  Filled 2021-07-03: qty 90, 90d supply, fill #0

## 2021-07-03 MED ORDER — SIMVASTATIN 20 MG PO TABS: ORAL_TABLET | ORAL | 3 refills | Status: DC

## 2021-07-03 MED ORDER — OLMESARTAN MEDOXOMIL 40 MG PO TABS
ORAL_TABLET | ORAL | 3 refills | Status: DC
  Filled 2021-07-03: qty 90, 90d supply, fill #0

## 2021-07-03 MED ORDER — CLONIDINE HCL 0.2 MG PO TABS
ORAL_TABLET | ORAL | 3 refills | Status: DC
  Filled 2021-07-25 – 2021-08-23 (×2): qty 180, 90d supply, fill #0
  Filled 2021-11-26: qty 180, 90d supply, fill #1

## 2021-07-03 MED ORDER — SPIRONOLACTONE 25 MG PO TABS
ORAL_TABLET | ORAL | 3 refills | Status: DC
  Filled 2021-07-03: qty 90, 90d supply, fill #0
  Filled 2021-09-28: qty 90, 90d supply, fill #1
  Filled 2021-12-26: qty 90, 90d supply, fill #2
  Filled 2022-03-29: qty 90, 90d supply, fill #3

## 2021-07-04 ENCOUNTER — Other Ambulatory Visit (HOSPITAL_COMMUNITY): Payer: Self-pay

## 2021-07-05 ENCOUNTER — Other Ambulatory Visit (HOSPITAL_COMMUNITY): Payer: Self-pay

## 2021-07-13 ENCOUNTER — Other Ambulatory Visit (HOSPITAL_COMMUNITY): Payer: Self-pay

## 2021-07-19 ENCOUNTER — Encounter (INDEPENDENT_AMBULATORY_CARE_PROVIDER_SITE_OTHER): Payer: Self-pay

## 2021-07-21 ENCOUNTER — Other Ambulatory Visit (HOSPITAL_COMMUNITY): Payer: Self-pay

## 2021-07-21 MED FILL — Simvastatin Tab 20 MG: ORAL | 90 days supply | Qty: 90 | Fill #1 | Status: AC

## 2021-07-25 ENCOUNTER — Other Ambulatory Visit (HOSPITAL_COMMUNITY): Payer: Self-pay

## 2021-07-26 ENCOUNTER — Other Ambulatory Visit (HOSPITAL_COMMUNITY): Payer: Self-pay

## 2021-07-28 ENCOUNTER — Other Ambulatory Visit (HOSPITAL_COMMUNITY): Payer: Self-pay

## 2021-07-28 MED ORDER — OLMESARTAN MEDOXOMIL 40 MG PO TABS
ORAL_TABLET | ORAL | 1 refills | Status: DC
  Filled 2021-07-28: qty 180, 90d supply, fill #0

## 2021-08-07 ENCOUNTER — Other Ambulatory Visit (HOSPITAL_COMMUNITY)
Admission: RE | Admit: 2021-08-07 | Discharge: 2021-08-07 | Disposition: A | Discharge status: Home / Self Care | Payer: No Typology Code available for payment source | Source: Ambulatory Visit | Attending: Obstetrics | Admitting: Obstetrics

## 2021-08-07 ENCOUNTER — Encounter: Payer: Self-pay | Admitting: Obstetrics

## 2021-08-07 ENCOUNTER — Other Ambulatory Visit (HOSPITAL_COMMUNITY): Payer: Self-pay

## 2021-08-07 ENCOUNTER — Ambulatory Visit (INDEPENDENT_AMBULATORY_CARE_PROVIDER_SITE_OTHER): Payer: No Typology Code available for payment source | Admitting: Obstetrics

## 2021-08-07 ENCOUNTER — Other Ambulatory Visit: Payer: Self-pay

## 2021-08-07 VITALS — BP 191/106 | HR 72 | Wt 213.1 lb

## 2021-08-07 DIAGNOSIS — N898 Other specified noninflammatory disorders of vagina: Secondary | ICD-10-CM | POA: Diagnosis not present

## 2021-08-07 DIAGNOSIS — Z01419 Encounter for gynecological examination (general) (routine) without abnormal findings: Secondary | ICD-10-CM | POA: Diagnosis not present

## 2021-08-07 DIAGNOSIS — E139 Other specified diabetes mellitus without complications: Secondary | ICD-10-CM | POA: Diagnosis not present

## 2021-08-07 DIAGNOSIS — N951 Menopausal and female climacteric states: Secondary | ICD-10-CM

## 2021-08-07 DIAGNOSIS — I10 Essential (primary) hypertension: Secondary | ICD-10-CM

## 2021-08-07 DIAGNOSIS — E669 Obesity, unspecified: Secondary | ICD-10-CM

## 2021-08-07 DIAGNOSIS — Z113 Encounter for screening for infections with a predominantly sexual mode of transmission: Secondary | ICD-10-CM

## 2021-08-07 DIAGNOSIS — A6000 Herpesviral infection of urogenital system, unspecified: Secondary | ICD-10-CM

## 2021-08-07 MED ORDER — VALACYCLOVIR HCL 500 MG PO TABS
500.0000 mg | ORAL_TABLET | Freq: Every day | ORAL | 11 refills | Status: DC
  Filled 2021-08-07: qty 30, 30d supply, fill #0
  Filled 2021-09-08: qty 30, 30d supply, fill #1
  Filled 2021-11-15: qty 30, 30d supply, fill #2
  Filled 2021-12-22: qty 30, 30d supply, fill #3
  Filled 2022-03-29: qty 30, 30d supply, fill #4
  Filled 2022-04-16 – 2022-05-14 (×2): qty 30, 30d supply, fill #5
  Filled 2022-06-16: qty 30, 30d supply, fill #6
  Filled 2022-07-25: qty 30, 30d supply, fill #7

## 2021-08-07 MED ORDER — ESTRADIOL 0.1 MG/24HR TD PTWK
0.1000 mg | MEDICATED_PATCH | TRANSDERMAL | 12 refills | Status: DC
  Filled 2021-08-07: qty 4, 28d supply, fill #0
  Filled 2021-09-09: qty 4, 28d supply, fill #1
  Filled 2021-10-18: qty 4, 28d supply, fill #2
  Filled 2022-01-17: qty 4, 28d supply, fill #3
  Filled 2022-02-19: qty 4, 28d supply, fill #4
  Filled 2022-04-16: qty 4, 28d supply, fill #5
  Filled 2022-05-14: qty 4, 28d supply, fill #6
  Filled 2022-06-27: qty 4, 28d supply, fill #7
  Filled 2022-07-25: qty 4, 28d supply, fill #8

## 2021-08-07 NOTE — Progress Notes (Signed)
Subjective:        Vickie Lane is a 60 y.o. female here for a routine exam.  Current complaints: Vaginal discharge and hot flashes.that interferes with sleep..    Personal health questionnaire:  Is patient Ashkenazi Jewish, have a family history of breast and/or ovarian cancer: yes Is there a family history of uterine cancer diagnosed at age < 65, gastrointestinal cancer, urinary tract cancer, family member who is a Field seismologist syndrome-associated carrier: no Is the patient overweight and hypertensive, family history of diabetes, personal history of gestational diabetes, preeclampsia or PCOS: no Is patient over 57, have PCOS,  family history of premature CHD under age 58, diabetes, smoke, have hypertension or peripheral artery disease:  no At any time, has a partner hit, kicked or otherwise hurt or frightened you?: no Over the past 2 weeks, have you felt down, depressed or hopeless?: no Over the past 2 weeks, have you felt little interest or pleasure in doing things?:no   Gynecologic History No LMP recorded. Patient has had a hysterectomy. Contraception: status post hysterectomy Last Pap: 2012. Results were: normal Last mammogram: 2022. Results were: normal  Obstetric History OB History  Gravida Para Term Preterm AB Living  6 3 1 2 3 3   SAB IAB Ectopic Multiple Live Births  1 2     3     # Outcome Date GA Lbr Len/2nd Weight Sex Delivery Anes PTL Lv  6 Preterm 03/05/92 [redacted]w[redacted]d   M CS-LTranv   LIV  5 Preterm 07/30/90 [redacted]w[redacted]d   M CS-LTranv   LIV  4 Term 03/15/76    F Vag-Spont   LIV  3 IAB           2 IAB           1 SAB             Past Medical History:  Diagnosis Date   Allergy    Arthritis    "back" (11/07/2012)   Chronic lower back pain    Complication of anesthesia    somthing made her itch   Constipation    GERD (gastroesophageal reflux disease)    Heart murmur    "MVP" (`11/07/2012)   History of blood transfusion 1990's   "? w/one of my deliveries"  (11/07/2012)   Hyperlipidemia    LDL   Hypertension 04/03/10   Mitral valve prolapse    Seizures ([place]) 1991   "related to pregnancy; I had HELLP" (11/07/2012)- lastseizure was 1991 per pt    Sickle cell trait ([place])    Systemic lupus erythematosus ([place]) 04/03/10   joint pain   Type II diabetes mellitus ([place]) October 2012   Diet and exercise.    Past Surgical History:  Procedure Laterality Date   ABDOMINAL HYSTERECTOMY  1997   CARPAL TUNNEL RELEASE  2008   "right w/fracture OR" (11/07/2012)   [place]  11/07/2012   "L4-5" (11/07/2012)   TRIGGER FINGER RELEASE Right 05/04/2016   Procedure: MINOR RELEASE TRIGGER FINGER/A-1 PULLEY;  Surgeon: Charlotte Crumb, MD;  Location: [facility];  Service: Orthopedics;  Laterality: Right;   WRIST FRACTURE SURGERY  2008   "right" (11/07/2012)     Current Outpatient Medications:    aspirin EC 81 MG tablet, Take 81 mg by mouth every morning., Disp: , Rfl:    Cetirizine-Pseudoephedrine (ZYRTEC-D PO), Take 1 tablet by mouth daily as  needed. For allergies, Disp: , Rfl:    cloNIDine (CATAPRES) 0.2 MG tablet, Take 1 tablet by mouth twice a day, Disp: 60 tablet, Rfl: 12   estradiol (CLIMARA - DOSED IN MG/24 HR) 0.1 mg/24hr patch, Place onto the skin once a week on Sunday, Disp: 4 patch, Rfl: 8   olmesartan (BENICAR) 40 MG tablet, TAKE 1 TABLET BY MOUTH DAILY, Disp: 30 tablet, Rfl: 3   olmesartan (BENICAR) 40 MG tablet, Take 2 tablets by mouth once a day., Disp: 180 tablet, Rfl: 1   simvastatin (ZOCOR) 20 MG tablet, TAKE 1 TABLET BY MOUTH EVERY EVENING, Disp: 90 tablet, Rfl: 3   spironolactone (ALDACTONE) 25 MG tablet, Take 1 tablet by mouth once a day, Disp: 90 tablet, Rfl: 3   triamcinolone cream (KENALOG) 0.5 %, APPLY TO THE AFFECTED AREA(S) 2-3 TIMES A DAY, Disp: 30 g, Rfl: 0   benazepril (LOTENSIN) 20 MG tablet, TAKE 1 TABLET BY MOUTH ONCE DAILY, Disp: 90  tablet, Rfl: 1   benazepril-hydrochlorthiazide (LOTENSIN HCT) 20-25 MG tablet, TAKE 1/2 TAB BY MOUTH DAILY, Disp: , Rfl: 3   cloNIDine (CATAPRES) 0.1 MG tablet, Take 0.1 mg by mouth 2 (two) times daily. (Patient not taking: Reported on 08/07/2021), Disp: , Rfl:    cloNIDine (CATAPRES) 0.2 MG tablet, Take 1 tablet by mouth 2 times a day, Disp: 180 tablet, Rfl: 3   estradiol (CLIMARA) 0.1 mg/24hr patch, Place 1 patch (0.1 mg total) onto the skin once a week. Sunday, Disp: 4 patch, Rfl: 11   fluticasone (FLONASE) 50 MCG/ACT nasal spray, Place 2 sprays into both nostrils daily for 10 days., Disp: 16 g, Rfl: 0   FREESTYLE LITE test strip, , Disp: , Rfl: 11   glucose blood (FREESTYLE LITE) test strip, , Disp: , Rfl:    Lancets (FREESTYLE) lancets, , Disp: , Rfl: 12   meloxicam (MOBIC) 7.5 MG tablet, Take 7.5 mg by mouth daily as needed for pain., Disp: , Rfl:    Multiple Vitamin (MULTIVITAMIN WITH MINERALS) TABS, Take 1 tablet by mouth daily., Disp: , Rfl:    naproxen (NAPROSYN) 500 MG tablet, Take 1 tablet (500 mg total) by mouth 2 (two) times daily., Disp: 30 tablet, Rfl: 0   nitroGLYCERIN (NITROSTAT) 0.4 MG SL tablet, Place 1 tablet (0.4 mg total) under the tongue every 5 (five) minutes as needed for chest pain., Disp: 25 tablet, Rfl: 3   olmesartan (BENICAR) 40 MG tablet, Take1 tablet by mouth once a day, Disp: 90 tablet, Rfl: 1   olmesartan (BENICAR) 40 MG tablet, Take 1 tablet by mouth once a day, Disp: 90 tablet, Rfl: 3   simvastatin (ZOCOR) 20 MG tablet, Take 20 mg by mouth daily at 6 PM., Disp: , Rfl:    simvastatin (ZOCOR) 20 MG tablet, TAKE 1 TABLET BY MOUTH ONCE DAILY IN THE EVENING, Disp: 90 tablet, Rfl: 2   simvastatin (ZOCOR) 20 MG tablet, Take 1 tablet by mouth once a day in the evening, Disp: 90 tablet, Rfl: 3   tinidazole (TINDAMAX) 500 MG tablet, Take 2 tablets (1,000 mg total) by mouth daily with breakfast. (Patient not taking: Reported on 07/30/2019), Disp: 10 tablet, Rfl: 2    valACYclovir (VALTREX) 1000 MG tablet, Take 500 mg by mouth daily., Disp: , Rfl:  Allergies  Allergen Reactions   Ciprofloxacin Anaphylaxis   Codeine Nausea Only, Other (See Comments), Anaphylaxis and Nausea And Vomiting    "dry heaves" (11/07/2012)   Hydrocodone Nausea Only and Other (See Comments)    "  dry heaves" (11/07/2012)   Levofloxacin Anaphylaxis and Rash   Ultram [Tramadol Hcl] Nausea Only and Other (See Comments)    "dry heaves" (11/07/2012)   Amlodipine Swelling    Social History   Tobacco Use   Smoking status: Never   Smokeless tobacco: Never  Substance Use Topics   Alcohol use: Yes    Comment: 11/07/2012 "once/month might have a glass of wine"    Family History  Problem Relation Age of Onset   Heart attack Father    Heart disease Other    Hypertension Other    Breast cancer Paternal Aunt    Colon cancer Neg Hx    Colon polyps Neg Hx    Rectal cancer Neg Hx    Stomach cancer Neg Hx    Esophageal cancer Neg Hx       Review of Systems  Constitutional: negative for fatigue and weight loss Respiratory: negative for cough and wheezing Cardiovascular: negative for chest pain, fatigue and palpitations Gastrointestinal: negative for abdominal pain and change in bowel habits Musculoskeletal:negative for myalgias Neurological: negative for gait problems and tremors Behavioral/Psych: negative for abusive relationship, depression Endocrine: negative for temperature intolerance    Genitourinary:negative for abnormal menstrual periods, genital lesions, hot flashes, sexual problems and vaginal discharge Integument/breast: negative for breast lump, breast tenderness, nipple discharge and skin lesion(s)    Objective:       BP (!) 191/106   Pulse 72   Wt 213 lb 1.6 oz (96.7 kg)   BMI 34.40 kg/m  General:   alert  Skin:   no rash or abnormalities  Lungs:   clear to auscultation bilaterally  Heart:   regular rate and rhythm, S1, S2 normal, no murmur, click, rub or  gallop  Breasts:   normal without suspicious masses, skin or nipple changes or axillary nodes  Abdomen:  normal findings: no organomegaly, soft, non-tender and no hernia  Pelvis:  External genitalia: normal general appearance Urinary system: urethral meatus normal and bladder without fullness, nontender Vaginal: normal without tenderness, induration or masses Cervix: absent Adnexa: normal bimanual exam Uterus: absent   Lab Review Urine pregnancy test Labs reviewed yes Radiologic studies reviewed yes  I have spent a total of 20 minutes of face-to-face time, excluding clinical staff time, reviewing notes and preparing to see patient, ordering tests and/or medications, and counseling the patient.   Assessment:    1. Encounter for gynecological examination without abnormal finding  2. Vaginal discharge Rx: - Cervicovaginal ancillary only( [place])  3. Screen for STD (sexually transmitted disease) Rx: - Hepatitis B surface antigen - Hepatitis C antibody - HIV Antibody (routine testing w rflx) - RPR  4. 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 HTN (hypertension), benign - poorly controlled - managed by PCP  6. Obesity (BMI 30.0-34.9) - weight reduction with the aid of dietary changes, exercise and behavioral modification recommended  7. Hot flashes due to menopause Rx: - estradiol (CLIMARA) 0.1 mg/24hr patch; Place 1 patch (0.1 mg total) onto the skin once a week.  Dispense: 4 patch; Refill: 12  8. Herpes simplex infection of genitourinary system Rx: - valACYclovir (VALTREX) 500 MG tablet; Take 1 tablet (500 mg total) by mouth daily.  Dispense: 30 tablet; Refill: 11     Plan:    Education reviewed: calcium supplements, depression evaluation, low fat, low cholesterol diet, safe sex/STD prevention, self breast exams, and weight bearing exercise. Contraception: status post hysterectomy. Hormone replacement therapy: hormone replacement  therapy: E2  Patch, risks and benefits reviewed, and prescription for 12 months. Follow up in: 1 year.     Shelly Bombard, MD 08/07/2021 10:59 AM

## 2021-08-07 NOTE — Progress Notes (Signed)
Here for annual BP elevated-seeing cardiology tomorrow-PCP has just changed and increased BP meds.  Requests STD swab and blood testing.  Needs refill estradiol

## 2021-08-08 ENCOUNTER — Other Ambulatory Visit (HOSPITAL_COMMUNITY): Payer: Self-pay

## 2021-08-08 ENCOUNTER — Ambulatory Visit (INDEPENDENT_AMBULATORY_CARE_PROVIDER_SITE_OTHER): Payer: No Typology Code available for payment source | Admitting: Family

## 2021-08-08 ENCOUNTER — Encounter (HOSPITAL_BASED_OUTPATIENT_CLINIC_OR_DEPARTMENT_OTHER): Payer: Self-pay | Admitting: Family

## 2021-08-08 VITALS — BP 186/120 | HR 78 | Ht 66.0 in | Wt 214.6 lb

## 2021-08-08 DIAGNOSIS — R0600 Dyspnea, unspecified: Secondary | ICD-10-CM | POA: Diagnosis not present

## 2021-08-08 DIAGNOSIS — R0609 Other forms of dyspnea: Secondary | ICD-10-CM

## 2021-08-08 DIAGNOSIS — E782 Mixed hyperlipidemia: Secondary | ICD-10-CM

## 2021-08-08 DIAGNOSIS — I10 Essential (primary) hypertension: Secondary | ICD-10-CM

## 2021-08-08 DIAGNOSIS — I25118 Atherosclerotic heart disease of native coronary artery with other forms of angina pectoris: Secondary | ICD-10-CM | POA: Diagnosis not present

## 2021-08-08 DIAGNOSIS — E785 Hyperlipidemia, unspecified: Secondary | ICD-10-CM

## 2021-08-08 LAB — RPR: RPR Ser Ql: NONREACTIVE

## 2021-08-08 LAB — CERVICOVAGINAL ANCILLARY ONLY
Bacterial Vaginitis (gardnerella): NEGATIVE
Candida Glabrata: NEGATIVE
Candida Vaginitis: NEGATIVE
Chlamydia: NEGATIVE
Comment: NEGATIVE
Comment: NEGATIVE
Comment: NEGATIVE
Comment: NEGATIVE
Comment: NEGATIVE
Comment: NORMAL
Neisseria Gonorrhea: NEGATIVE
Trichomonas: NEGATIVE

## 2021-08-08 LAB — HIV ANTIBODY (ROUTINE TESTING W REFLEX): HIV Screen 4th Generation wRfx: NONREACTIVE

## 2021-08-08 LAB — HEPATITIS B SURFACE ANTIGEN: Hepatitis B Surface Ag: NEGATIVE

## 2021-08-08 LAB — HEPATITIS C ANTIBODY: Hep C Virus Ab: <0.1 s/co ratio (ref 0.0–0.9)

## 2021-08-08 MED ORDER — VALSARTAN 320 MG PO TABS
320.0000 mg | ORAL_TABLET | Freq: Every day | ORAL | 1 refills | Status: DC
  Filled 2021-08-08: qty 30, 30d supply, fill #0
  Filled 2021-09-08: qty 30, 30d supply, fill #1

## 2021-08-08 MED ORDER — SIMVASTATIN 40 MG PO TABS
40.0000 mg | ORAL_TABLET | Freq: Every day | ORAL | 1 refills | Status: DC
  Filled 2021-08-08: qty 90, 90d supply, fill #0
  Filled 2021-12-22: qty 90, 90d supply, fill #1

## 2021-08-08 NOTE — Progress Notes (Signed)
Office Visit    Patient Name: Vickie Lane Date of Encounter: 08/08/2021  PCP:  Kelton Pillar, [facility]  Cardiologist:  Jenkins Rouge, MD  Advanced Practice Provider:  No care team member to display Electrophysiologist:  None   Chief Complaint    Vickie Lane is a 60 y.o. female with a hx of HLD, HTN, nonobstructive CAD, sickle cell trait, GERD, MVP, DM2 managed by diet and exercise presents today for hypertension, exercise intolerance  Past Medical History    Past Medical History:  Diagnosis Date   Allergy    Arthritis    "back" (11/07/2012)   Chronic lower back pain    Complication of anesthesia    somthing made her itch   Constipation    GERD (gastroesophageal reflux disease)    Heart murmur    "MVP" (`11/07/2012)   History of blood transfusion 1990's   "? w/one of my deliveries" (11/07/2012)   Hyperlipidemia    LDL   Hypertension 04/03/10   Mitral valve prolapse    Seizures ([place]) 1991   "related to pregnancy; I had HELLP" (11/07/2012)- lastseizure was 1991 per pt    Sickle cell trait ([place])    Systemic lupus erythematosus ([place]) 04/03/10   joint pain   Type II diabetes mellitus ([place]) October 2012   Diet and exercise.   Past Surgical History:  Procedure Laterality Date   ABDOMINAL HYSTERECTOMY  1997   CARPAL TUNNEL RELEASE  2008   "right w/fracture OR" (11/07/2012)   [place]  11/07/2012   "L4-5" (11/07/2012)   TRIGGER FINGER RELEASE Right 05/04/2016   Procedure: MINOR RELEASE TRIGGER FINGER/A-1 PULLEY;  Surgeon: Charlotte Crumb, MD;  Location: [facility];  Service: Orthopedics;  Laterality: Right;   WRIST FRACTURE SURGERY  2008   "right" (11/07/2012)    Allergies  Allergies  Allergen Reactions   Ciprofloxacin Anaphylaxis   Codeine Nausea Only, Other (See Comments), Anaphylaxis and Nausea And  Vomiting    "dry heaves" (11/07/2012)   Hydrocodone Nausea Only and Other (See Comments)    "dry heaves" (11/07/2012)   Levofloxacin Anaphylaxis and Rash   Ultram [Tramadol Hcl] Nausea Only and Other (See Comments)    "dry heaves" (11/07/2012)   Amlodipine Swelling    History of Present Illness    Vickie Lane is a 60 y.o. female with a hx of HLD, HTN, nonobstructive CAD, sickle cell trait, GERD, MVP, DM2 managed by diet and exercise last seen 12/18/18 by Dr. Johnsie Cancel.  She had cardiac CTA in 2017 with calcium score of 34 placing her in 90th percentile for age and sec matched control. She had less than 50% disease which was nonobstructive in prox LAD and D2 with <30% calcific disease in proximal and mid RCA. Echocardiogram 08/2018 normal LVEF 55-60% and no valvular abnormality.   Last seen 11/2018 with labile blood pressure and recommendations were provided for her primary care provider for management.   She presents today for follow up. 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 She has had high blood pressure since she was 60 years old which was exacerbated by pregnancies and HELLP syndrome. Shares with me that every few years her blood pressure medications just stop working and have to be adjusted. Most recently Benazepril was transitioned to Benicar by PCP and Amlodipine was stopped due  to LE edema. Her edema has since resolved.  Reports no chest pain, pressure, tightness.   She is working on weight loss and doing a diet and exercise program. We discussed possible enrollment in PREP program through the [facility] and she tells me she will consider when her current program ends. She does strength training and walks for exercise. She did walk 5 miles today though noted difficulty. Over the last few months she has had activity intolerance, difficulty with stairs, and dyspnea on exertion.   EKGs/Labs/Other Studies Reviewed:   The following studies were reviewed  today:  EKG:  EKG is  ordered today.  The ekg ordered today demonstrates NSR 78 bpm with stable infeorlateral T wave changes.   Recent Labs: No results found for requested labs within last 8760 hours.  Recent Lipid Panel    Component Value Date/Time   CHOL 170 03/29/2016 0847   TRIG 41 03/29/2016 0847   HDL 83 03/29/2016 0847   CHOLHDL 2.0 03/29/2016 0847   VLDL 8 03/29/2016 0847   LDLCALC 79 03/29/2016 0847    Home Medications   Current Meds  Medication Sig   aspirin EC 81 MG tablet Take 81 mg by mouth every morning.   Cetirizine-Pseudoephedrine (ZYRTEC-D PO) Take 1 tablet by mouth daily as needed. For allergies   cloNIDine (CATAPRES) 0.2 MG tablet Take 1 tablet by mouth 2 times a day   estradiol (CLIMARA) 0.1 mg/24hr patch Place 1 patch (0.1 mg total) onto the skin once a week.   FREESTYLE LITE test strip    glucose blood test strip    Lancets (FREESTYLE) lancets    meloxicam (MOBIC) 7.5 MG tablet Take 7.5 mg by mouth daily as needed for pain.   Multiple Vitamin (MULTIVITAMIN WITH MINERALS) TABS Take 1 tablet by mouth daily.   olmesartan (BENICAR) 40 MG tablet Take 2 tablets by mouth once a day.   simvastatin (ZOCOR) 20 MG tablet Take 1 tablet by mouth once a day in the evening   spironolactone (ALDACTONE) 25 MG tablet Take 1 tablet by mouth once a day   triamcinolone cream (KENALOG) 0.5 % APPLY TO THE AFFECTED AREA(S) 2-3 TIMES A DAY   valACYclovir (VALTREX) 500 MG tablet Take 1 tablet (500 mg total) by mouth daily.     Review of Systems      All other systems reviewed and are otherwise negative except as noted above.  Physical Exam    VS:  BP (!) 186/120   Pulse 78   Ht 5\' 6"  (1.676 m)   Wt 214 lb 9.6 oz (97.3 kg)   BMI 34.64 kg/m  , BMI Body mass index is 34.64 kg/m.  Wt Readings from Last 3 Encounters:  08/08/21 214 lb 9.6 oz (97.3 kg)  08/07/21 213 lb 1.6 oz (96.7 kg)  08/01/20 174 lb (78.9 kg)     GEN: Well nourished, well developed, in no acute  distress. HEENT: normal. Neck: Supple, no JVD, carotid bruits, or masses. Cardiac: RRR, no murmurs, rubs, or gallops. No clubbing, cyanosis, edema.  Radials/PT 2+ and equal bilaterally.  Respiratory:  Respirations regular and unlabored, clear to auscultation bilaterally. GI: Soft, nontender, nondistended. MS: No deformity or atrophy. Skin: Warm and dry, no rash. Neuro:  Strength and sensation are intact. Psych: Normal affect.  Assessment & Plan    Dyspnea - 3 month history of dyspnea, exercise intolerance. She had LE edema but this resolved with discontinuation of Amlodipine and addition of Spironolactone. Plan for echocardiogram to  rule out heart failure and assess for wall motion abnormality. If echo unrevealing and symptoms persistent, plan for ischemic evaluation with lexiscan myoview vs repeat cardiac CTA.  CKDII -Careful titration of diuretic and antihypertensive.  BMp today and in 1 week for monitoring.   CAD - No chest pain. Dyspnea workup, as above. GDMT includes Simvastatin, Aspirin. Heart healthy diet and regular cardiovascular exercise encouraged.    Obesity - Weight loss via diet and exercise encouraged. Discussed the impact being overweight would have on cardiovascular risk including HLD, Dm2, CAD, HTN.. Presently doing exercise and eating plan. Discussed [employer] PREP Program and she is considering for when she finishes her current program.   HTN - BP not at goal. Difficult to control over the years. Will refer to Dr. Oval Linsey and the [facility]. Vickie Lane is excited by the referral as she heard Dr. Oval Linsey speak recently. Stop Olmesartan. Start Valsartan 320mg  QD. Continue Clonidine 0.2 mg BID. BMP today and in 1 week.   HLD - LDL most recent of 80. LDL goal <70. Increase Simvastatin to 40mg  daily.  DM2 - Continue to follow with PCP. Monitors Fructrosamine level due to history of sickle cell trait.  Disposition: Follow up with [facility]  Signed, Loel Dubonnet, NP 08/08/2021, 3:33 PM [place]

## 2021-08-08 NOTE — Patient Instructions (Addendum)
Medication Instructions:  Your physician has recommended you make the following change in your medication:   STOP Olmesartan  START Valsartan one 320mg  tablet daily  CHANGE Simvastatin to 40mg  daily   *If you need a refill on your cardiac medications before your next appointment, please call your pharmacy*  Lab Work: Your physician recommends that you return for lab work today: BMP, TSH  Your physician recommends that you return for lab work in 1 week for repeat BMP  If you have labs (blood work) drawn today and your tests are completely normal, you will receive your results only by: [place] (if you have [place]) OR A paper copy in the mail If you have any lab test that is abnormal or we need to change your treatment, we will call you to review the results.  Testing/Procedures: Your physician has requested that you have an echocardiogram. Echocardiography is a painless test that uses sound waves to create images of your heart. It provides your doctor with information about the size and shape of your heart and how well your heart's chambers and valves are working. This procedure takes approximately one hour. There are no restrictions for this procedure.   Follow-Up: At [facility], you and your health needs are our priority.  As part of our continuing mission to provide you with exceptional heart care, we have created designated Provider Care Teams.  These Care Teams include your primary Cardiologist (physician) and Advanced Practice Providers (APPs -  Physician Assistants and Nurse Practitioners) who all work together to provide you with the care you need, when you need it.  We recommend signing up for the patient portal called "MyChart".  Sign up information is provided on this After Visit Summary.  MyChart is used to connect with patients for Virtual Visits (Telemedicine).  Patients are able to view lab/test results, encounter notes, upcoming appointments, etc.  Non-urgent  messages can be sent to your provider as well.   To learn more about what you can do with MyChart, go to [email or web address].    Your next appointment:   With [facility]   Other Instructions  Tips to Measure your Blood Pressure Correctly  To determine whether you have hypertension, a medical professional will take a blood pressure reading. How you prepare for the test, the position of your arm, and other factors can change a blood pressure reading by 10% or more. That could be enough to hide high blood pressure, start you on a drug you don't really need, or lead your doctor to incorrectly adjust your medications.  National and international guidelines offer specific instructions for measuring blood pressure. If a doctor, nurse, or medical assistant isn't doing it right, don't hesitate to ask him or her to get with the guidelines.  Here's what you can do to ensure a correct reading:  Don't drink a caffeinated beverage or smoke during the 30 minutes before the test.  Sit quietly for five minutes before the test begins.  During the measurement, sit in a chair with your feet on the floor and your arm supported so your elbow is at about heart level.  The inflatable part of the cuff should completely cover at least 80% of your upper arm, and the cuff should be placed on bare skin, not over a shirt.  Don't talk during the measurement.  Have your blood pressure measured twice, with a brief break in between. If the readings are different by 5 points or more, have it done  a third time.  In 2017, new guidelines from the [place], the [employer] of Cardiology, and nine other health organizations lowered the diagnosis of high blood pressure to 130/80 mm Hg or higher for all adults. The guidelines also redefined the various blood pressure categories to now include normal, elevated, Stage 1 hypertension, Stage 2 hypertension, and hypertensive crisis (see "Blood  pressure categories").  Blood pressure categories  Blood pressure category SYSTOLIC (upper number)  DIASTOLIC (lower number)  Normal Less than 120 mm Hg and Less than 80 mm Hg  Elevated 120-129 mm Hg and Less than 80 mm Hg  High blood pressure: Stage 1 hypertension 130-139 mm Hg or 80-89 mm Hg  High blood pressure: Stage 2 hypertension 140 mm Hg or higher or 90 mm Hg or higher  Hypertensive crisis (consult your doctor immediately) Higher than 180 mm Hg and/or Higher than 120 mm Hg  Source: American Heart Association and American Stroke Association. For more on getting your blood pressure under control, buy Controlling Your Blood Pressure, a Special Health Report from [facility].   Blood Pressure Log   Date   Time  Blood Pressure  Position  Example: Nov 1 9 AM 124/78 sitting                                                     Heart Healthy Diet Recommendations: A low-salt diet is recommended. Meats should be grilled, baked, or boiled. Avoid fried foods. Focus on lean protein sources like fish or chicken with vegetables and fruits. The American Heart Association is a Microbiologist!    Exercise recommendations: The American Heart Association recommends 150 minutes of moderate intensity exercise weekly. Try 30 minutes of moderate intensity exercise 4-5 times per week. This could include walking, jogging, or swimming.

## 2021-08-10 LAB — BASIC METABOLIC PANEL
BUN/Creatinine Ratio: 10 (ref 9–23)
BUN: 9 mg/dL (ref 6–24)
CO2: 21 mmol/L (ref 20–29)
Calcium: 10.2 mg/dL (ref 8.7–10.2)
Chloride: 105 mmol/L (ref 96–106)
Creatinine, Ser: 0.93 mg/dL (ref 0.57–1.00)
Glucose: 97 mg/dL (ref 65–99)
Potassium: 4.6 mmol/L (ref 3.5–5.2)
Sodium: 142 mmol/L (ref 134–144)
eGFR: 71 mL/min/{1.73_m2} (ref >59)

## 2021-08-10 LAB — TSH: TSH: 1.82 u[IU]/mL (ref 0.450–4.500)

## 2021-08-11 ENCOUNTER — Encounter (HOSPITAL_BASED_OUTPATIENT_CLINIC_OR_DEPARTMENT_OTHER): Payer: Self-pay

## 2021-08-14 DIAGNOSIS — Z0289 Encounter for other administrative examinations: Secondary | ICD-10-CM

## 2021-08-16 ENCOUNTER — Other Ambulatory Visit: Payer: Self-pay

## 2021-08-16 ENCOUNTER — Ambulatory Visit (INDEPENDENT_AMBULATORY_CARE_PROVIDER_SITE_OTHER): Payer: No Typology Code available for payment source

## 2021-08-16 DIAGNOSIS — R0600 Dyspnea, unspecified: Secondary | ICD-10-CM

## 2021-08-16 DIAGNOSIS — R0609 Other forms of dyspnea: Secondary | ICD-10-CM

## 2021-08-16 LAB — ECHOCARDIOGRAM COMPLETE
Area-P 1/2: 3.91 cm2
S' Lateral: 2.77 cm

## 2021-08-17 ENCOUNTER — Encounter (HOSPITAL_BASED_OUTPATIENT_CLINIC_OR_DEPARTMENT_OTHER): Payer: Self-pay

## 2021-08-17 DIAGNOSIS — I10 Essential (primary) hypertension: Secondary | ICD-10-CM

## 2021-08-17 LAB — BASIC METABOLIC PANEL
BUN/Creatinine Ratio: 12 (ref 9–23)
BUN: 11 mg/dL (ref 6–24)
CO2: 21 mmol/L (ref 20–29)
Calcium: 10 mg/dL (ref 8.7–10.2)
Chloride: 106 mmol/L (ref 96–106)
Creatinine, Ser: 0.91 mg/dL (ref 0.57–1.00)
Glucose: 90 mg/dL (ref 70–99)
Potassium: 4.9 mmol/L (ref 3.5–5.2)
Sodium: 141 mmol/L (ref 134–144)
eGFR: 73 mL/min/{1.73_m2} (ref >59)

## 2021-08-24 ENCOUNTER — Other Ambulatory Visit (HOSPITAL_COMMUNITY): Payer: Self-pay

## 2021-08-24 MED ORDER — CHLORTHALIDONE 25 MG PO TABS
25.0000 mg | ORAL_TABLET | Freq: Every day | ORAL | 5 refills | Status: DC
  Filled 2021-08-24: qty 30, 30d supply, fill #0
  Filled 2021-09-20: qty 30, 30d supply, fill #1
  Filled 2021-10-18: qty 30, 30d supply, fill #2
  Filled 2021-11-15: qty 30, 30d supply, fill #3
  Filled 2021-12-09: qty 30, 30d supply, fill #4
  Filled 2022-01-17: qty 30, 30d supply, fill #5

## 2021-08-30 ENCOUNTER — Ambulatory Visit
Admission: RE | Admit: 2021-08-30 | Discharge: 2021-08-30 | Disposition: A | Discharge status: Home / Self Care | Payer: No Typology Code available for payment source | Source: Ambulatory Visit | Attending: Sports Medicine | Admitting: Sports Medicine

## 2021-08-30 ENCOUNTER — Other Ambulatory Visit: Payer: Self-pay | Admitting: Sports Medicine

## 2021-08-30 DIAGNOSIS — M25559 Pain in unspecified hip: Secondary | ICD-10-CM

## 2021-09-05 ENCOUNTER — Telehealth: Payer: Self-pay | Admitting: *Deleted

## 2021-09-05 DIAGNOSIS — I25118 Atherosclerotic heart disease of native coronary artery with other forms of angina pectoris: Secondary | ICD-10-CM

## 2021-09-05 DIAGNOSIS — I10 Essential (primary) hypertension: Secondary | ICD-10-CM

## 2021-09-05 DIAGNOSIS — Z79899 Other long term (current) drug therapy: Secondary | ICD-10-CM

## 2021-09-05 DIAGNOSIS — R7989 Other specified abnormal findings of blood chemistry: Secondary | ICD-10-CM

## 2021-09-05 LAB — BASIC METABOLIC PANEL
BUN/Creatinine Ratio: 11 (ref 9–23)
BUN: 13 mg/dL (ref 6–24)
CO2: 17 mmol/L — ABNORMAL LOW (ref 20–29)
Calcium: 10.1 mg/dL (ref 8.7–10.2)
Chloride: 105 mmol/L (ref 96–106)
Creatinine, Ser: 1.16 mg/dL — ABNORMAL HIGH (ref 0.57–1.00)
Glucose: 104 mg/dL — ABNORMAL HIGH (ref 70–99)
Potassium: 5.2 mmol/L (ref 3.5–5.2)
Sodium: 141 mmol/L (ref 134–144)
eGFR: 54 mL/min/{1.73_m2} — ABNORMAL LOW (ref >59)

## 2021-09-05 NOTE — Telephone Encounter (Signed)
The patient has been notified of the result and verbalized understanding.  All questions (if any) were answered.  Pt aware that I will mail her, her lab requisition form to her confirmed mailing address on file, so she can take to [facility] lab at [place], to be done in one week per Laurann Montana NP.  Lab order for repeat BMET released in the system.  Pt aware to increase her po water intake between now and her lab appt next week.  Pt verbalized understanding and agrees with this plan.   Your provider has recommended lab work (REPEAT BMET X 1 WEEK). Please have this collected at [facility] at [place]. The lab is open 8:00 am - 4:30 pm. Please avoid 12:00p - 1:00p for lunch hour. You do not need an appointment. Please go to [address] [place], [place] [address]. This is in the Primary Care office on the 3rd floor, let them know you are there for blood work and they will direct you to the lab.

## 2021-09-05 NOTE — Telephone Encounter (Signed)
-----   Message from Loel Dubonnet, NP sent at 09/05/2021 12:19 PM EDT ----- Kidney function slightly decreased from baseline. Recommend increasing hydration and repeat BMP in 1 week. If still with reduced renal function, may need to reduce Chlorthalidone. Will readdress pending repeat labs.

## 2021-09-06 ENCOUNTER — Other Ambulatory Visit: Payer: Self-pay

## 2021-09-06 ENCOUNTER — Encounter (INDEPENDENT_AMBULATORY_CARE_PROVIDER_SITE_OTHER): Payer: Self-pay | Admitting: Bariatrics

## 2021-09-06 ENCOUNTER — Ambulatory Visit (INDEPENDENT_AMBULATORY_CARE_PROVIDER_SITE_OTHER): Payer: No Typology Code available for payment source | Admitting: Bariatrics

## 2021-09-06 VITALS — BP 178/118 | HR 69 | Temp 98.1°F | Ht 66.0 in | Wt 193.0 lb

## 2021-09-06 DIAGNOSIS — E1169 Type 2 diabetes mellitus with other specified complication: Secondary | ICD-10-CM

## 2021-09-06 DIAGNOSIS — R5383 Other fatigue: Secondary | ICD-10-CM | POA: Diagnosis not present

## 2021-09-06 DIAGNOSIS — Z6831 Body mass index (BMI) 31.0-31.9, adult: Secondary | ICD-10-CM

## 2021-09-06 DIAGNOSIS — E785 Hyperlipidemia, unspecified: Secondary | ICD-10-CM

## 2021-09-06 DIAGNOSIS — E6609 Other obesity due to excess calories: Secondary | ICD-10-CM

## 2021-09-06 DIAGNOSIS — I10 Essential (primary) hypertension: Secondary | ICD-10-CM | POA: Diagnosis not present

## 2021-09-06 DIAGNOSIS — Z1331 Encounter for screening for depression: Secondary | ICD-10-CM

## 2021-09-06 DIAGNOSIS — E669 Obesity, unspecified: Secondary | ICD-10-CM

## 2021-09-06 DIAGNOSIS — I25119 Atherosclerotic heart disease of native coronary artery with unspecified angina pectoris: Secondary | ICD-10-CM

## 2021-09-06 DIAGNOSIS — E559 Vitamin D deficiency, unspecified: Secondary | ICD-10-CM

## 2021-09-06 DIAGNOSIS — R0602 Shortness of breath: Secondary | ICD-10-CM | POA: Diagnosis not present

## 2021-09-06 NOTE — Progress Notes (Signed)
Chief Complaint:   OBESITY Vickie Lane (MR# [id number]) is a 60 y.o. female who presents for evaluation and treatment of obesity and related comorbidities. Current BMI is Body mass index is 31.15 kg/m. Vickie Lane has been struggling with her weight for many years and has been unsuccessful in either losing weight, maintaining weight loss, or reaching her healthy weight goal.  Vickie Lane states that she is lactose intolerance. She states that she likes to cook.   Vickie Lane is currently in the action stage of change and ready to dedicate time achieving and maintaining a healthier weight. Vickie Lane is interested in becoming our patient and working on intensive lifestyle modifications including (but not limited to) diet and exercise for weight loss.  Vickie Lane's habits were reviewed today and are as follows: her desired weight loss is 64 pounds, she started gaining weight at 60 years old, her heaviest weight ever was 214 pounds, she has significant food cravings issues, she snacks frequently in the evenings, she wakes up frequently in the middle of the night to eat, she skips meals frequently, she is trying to follow a vegetarian diet, she is frequently drinking liquids with calories, and she struggles with emotional eating.  Depression Screen Vickie Lane's Food and Mood (modified PHQ-9) score was 2.  Depression screen PHQ 2/9 09/06/2021  Decreased Interest 0  Down, Depressed, Hopeless 0  PHQ - 2 Score 0  Altered sleeping 0  Tired, decreased energy 1  Change in appetite 0  Feeling bad or failure about yourself  1  Trouble concentrating 0  Moving slowly or fidgety/restless 0  Suicidal thoughts 0  PHQ-9 Score 2   Subjective:   1. Other fatigue Vickie Lane reports daytime somnolence and admits to waking up still tired. Patent has a history of symptoms of morning fatigue. Vickie Lane generally gets  4-6  hours of sleep per night, and states that she has nightime awakenings. Snoring is present.  Apneic episodes is not present. Epworth Sleepiness Score is 13.   2. SOB (shortness of breath) Vickie Lane notes increasing shortness of breath with exercising and seems to be worsening over time with weight gain. She notes getting out of breath sooner with activity than she used to. This has not gotten worse recently. Vickie Lane denies shortness of breath at rest or orthopnea.   3. Essential hypertension Vickie Lane's blood pressure is high in value today. She is currently taking Hygroton, Catapres, Aldactone and Diovan.   4. Coronary artery disease involving native heart with angina pectoris, unspecified vessel or lesion type (Vickie Lane) Vickie Lane has coronary artery disease, and she sees Cardiology.  5. Hyperlipidemia, unspecified hyperlipidemia type Vickie Lane is taking Zocor currently.   6. Diabetes mellitus type 2 in obese Vickie Lane) Vickie Lane was diagnosed in 2012-2013, and she is currently not on medications. Her last Fructosamine was 282.  7. Vitamin D deficiency Vickie Lane is taking multi vitamins, B complex, B 12, and Vit D3.  Assessment/Plan:   1. Other fatigue Vickie Lane does feel that her weight is causing her energy to be lower than it should be. Fatigue may be related to obesity, depression or many other causes. Labs will be ordered, and in the meanwhile, Vickie Lane will focus on self care including making healthy food choices, increasing physical activity and focusing on stress reduction.   2. SOB (shortness of breath) Vickie Lane does not feel that she gets out of breath more easily that she used to when she exercises. Vickie Lane's shortness of breath appears to be obesity related and exercise induced. She has  agreed to work on weight loss and gradually increase exercise to treat her exercise induced shortness of breath. Will continue to monitor closely.   3. Essential hypertension Vickie Lane will continue medications, and she is to work on her blood pressure with her primary care physician. She will work on healthy  weight loss and exercise to improve blood pressure control. We will watch for signs of hypotension as she continues her lifestyle modifications.  4. Coronary artery disease involving native heart with angina pectoris, unspecified vessel or lesion type Vickie Lane) Vickie Lane will follow up with her cardiologist.   5. Hyperlipidemia, unspecified hyperlipidemia type Cardiovascular risk and specific lipid/LDL goals reviewed. We discussed several lifestyle modifications today and Vickie Lane will continue to work on diet, exercise and weight loss efforts. Vickie Lane will continue her medications. Orders and follow up as documented in patient record.   Counseling Intensive lifestyle modifications are the first line treatment for this issue. Dietary changes: Increase soluble fiber. Decrease simple carbohydrates. Exercise changes: Moderate to vigorous-intensity aerobic activity 150 minutes per week if tolerated. Lipid-lowering medications: see documented in medical record.  6. Diabetes mellitus type 2 in obese Vickie Lane) We will check labs today. Vickie Lane will work on decreasing carbohydrates and current weight loss. Good blood sugar control is important to decrease the likelihood of diabetic complications such as nephropathy, neuropathy, limb loss, blindness, coronary artery disease, and death. Intensive lifestyle modification including diet, exercise and weight loss are the first line of treatment for diabetes.   - Insulin, random - C-peptide  7. Vitamin D deficiency Low Vitamin D level contributes to fatigue and are associated with obesity, breast, and colon cancer. We will check labs today and Vickie Lane will follow-up for routine testing of Vitamin D, at least 2-3 times per year to avoid over-replacement.  - VITAMIN D 25 Hydroxy (Vit-D Deficiency, Fractures)  8. Obesity BMI 31.2 Vickie Lane is currently in the action stage of change and her goal is to continue with weight loss efforts. I recommend Vickie Lane begin the  structured treatment plan as follows:  She has agreed to the Category 2 Plan or keeping a food journal and adhering to recommended goals of 1100-1200 calories and 70+ grams of protein daily.  Vickie Lane will continue meal planning and intentional eating. She will have no sugary drinks. I reviewed labs from 09/04/2021, 08/16/2021, 08/08/2021 and 07/03/2021 with Vickie Lane. Vickie Lane has sickle cell traits.   Exercise goals: No exercise has been prescribed at this time.   Behavioral modification strategies: increasing lean protein intake, decreasing simple carbohydrates, increasing vegetables, increasing water intake, decreasing eating out, no skipping meals, meal planning and cooking strategies, keeping healthy foods in the home, and planning for success.  She was informed of the importance of frequent follow-up visits to maximize her success with intensive lifestyle modifications for her multiple health conditions. She was informed we would discuss her lab results at her next visit unless there is a critical issue that needs to be addressed sooner. Vickie Lane agreed to keep her next visit at the agreed upon time to discuss these results.  Objective:   Blood pressure (!) 178/118, pulse 69, temperature 98.1 F (36.7 C), height 5\' 6"  (1.676 m), weight 193 lb (87.5 kg), SpO2 99 %. Body mass index is 31.15 kg/m.  EKG: Normal sinus rhythm, rate 78.  Indirect Calorimeter completed today shows a VO2 of 185 and a REE of 1267.  Her calculated basal metabolic rate is 4132 thus her basal metabolic rate is worse than expected.  General: Cooperative, alert,  well developed, in no acute distress. HEENT: Conjunctivae and lids unremarkable. Cardiovascular: Regular rhythm.  Lungs: Normal work of breathing. Neurologic: No focal deficits.   Lab Results  Component Value Date   CREATININE 1.16 (H) 09/04/2021   BUN 13 09/04/2021   NA 141 09/04/2021   K 5.2 09/04/2021   CL 105 09/04/2021   CO2 17 (L) 09/04/2021   Lab  Results  Component Value Date   ALT 16 03/29/2016   AST 19 03/29/2016   ALKPHOS 71 03/29/2016   BILITOT 0.3 03/29/2016   Lab Results  Component Value Date   HGBA1C 5.7 (H) 03/29/2016   No results found for: INSULIN Lab Results  Component Value Date   TSH 1.820 08/08/2021   Lab Results  Component Value Date   CHOL 170 03/29/2016   HDL 83 03/29/2016   LDLCALC 79 03/29/2016   TRIG 41 03/29/2016   CHOLHDL 2.0 03/29/2016   Lab Results  Component Value Date   WBC 4.3 03/29/2016   HGB 13.6 03/29/2016   HCT 40.5 03/29/2016   MCV 83.0 03/29/2016   PLT 272 03/29/2016   No results found for: IRON, TIBC, FERRITIN  Attestation Statements:   Reviewed by clinician on day of visit: allergies, medications, problem list, medical history, surgical history, family history, social history, and previous encounter notes.  I, Lizbeth Bark, RMA, am acting as Location manager for [employer], DO.   I have reviewed the above documentation for accuracy and completeness, and I agree with the above. Jearld Lesch, DO

## 2021-09-07 LAB — INSULIN, RANDOM: INSULIN: 5.9 u[IU]/mL (ref 2.6–24.9)

## 2021-09-07 LAB — VITAMIN D 25 HYDROXY (VIT D DEFICIENCY, FRACTURES): Vit D, 25-Hydroxy: 71.7 ng/mL (ref 30.0–100.0)

## 2021-09-07 LAB — C-PEPTIDE: C-Peptide: 2.2 ng/mL (ref 1.1–4.4)

## 2021-09-09 ENCOUNTER — Other Ambulatory Visit (HOSPITAL_COMMUNITY): Payer: Self-pay

## 2021-09-11 ENCOUNTER — Encounter (INDEPENDENT_AMBULATORY_CARE_PROVIDER_SITE_OTHER): Payer: Self-pay | Admitting: Bariatrics

## 2021-09-11 DIAGNOSIS — E6609 Other obesity due to excess calories: Secondary | ICD-10-CM | POA: Insufficient documentation

## 2021-09-11 DIAGNOSIS — E1169 Type 2 diabetes mellitus with other specified complication: Secondary | ICD-10-CM | POA: Insufficient documentation

## 2021-09-11 DIAGNOSIS — D099 Carcinoma in situ, unspecified: Secondary | ICD-10-CM | POA: Insufficient documentation

## 2021-09-11 DIAGNOSIS — E1159 Type 2 diabetes mellitus with other circulatory complications: Secondary | ICD-10-CM | POA: Insufficient documentation

## 2021-09-11 DIAGNOSIS — I152 Hypertension secondary to endocrine disorders: Secondary | ICD-10-CM | POA: Insufficient documentation

## 2021-09-12 ENCOUNTER — Other Ambulatory Visit (HOSPITAL_COMMUNITY): Payer: Self-pay

## 2021-09-13 LAB — BASIC METABOLIC PANEL
BUN/Creatinine Ratio: 14 (ref 9–23)
BUN: 15 mg/dL (ref 6–24)
CO2: 22 mmol/L (ref 20–29)
Calcium: 10 mg/dL (ref 8.7–10.2)
Chloride: 103 mmol/L (ref 96–106)
Creatinine, Ser: 1.04 mg/dL — ABNORMAL HIGH (ref 0.57–1.00)
Glucose: 107 mg/dL — ABNORMAL HIGH (ref 70–99)
Potassium: 4.3 mmol/L (ref 3.5–5.2)
Sodium: 140 mmol/L (ref 134–144)
eGFR: 62 mL/min/{1.73_m2} (ref >59)

## 2021-09-20 ENCOUNTER — Other Ambulatory Visit (HOSPITAL_COMMUNITY): Payer: Self-pay

## 2021-09-20 ENCOUNTER — Ambulatory Visit (INDEPENDENT_AMBULATORY_CARE_PROVIDER_SITE_OTHER): Payer: No Typology Code available for payment source | Admitting: Bariatrics

## 2021-09-20 ENCOUNTER — Encounter (INDEPENDENT_AMBULATORY_CARE_PROVIDER_SITE_OTHER): Payer: Self-pay | Admitting: Bariatrics

## 2021-09-20 ENCOUNTER — Other Ambulatory Visit: Payer: Self-pay

## 2021-09-20 VITALS — BP 154/98 | HR 68 | Temp 97.8°F | Ht 66.0 in

## 2021-09-20 DIAGNOSIS — E669 Obesity, unspecified: Secondary | ICD-10-CM | POA: Diagnosis not present

## 2021-09-20 DIAGNOSIS — I10 Essential (primary) hypertension: Secondary | ICD-10-CM

## 2021-09-20 DIAGNOSIS — E1169 Type 2 diabetes mellitus with other specified complication: Secondary | ICD-10-CM

## 2021-09-21 NOTE — Progress Notes (Signed)
Chief Complaint:   OBESITY Vickie Lane is here to discuss her progress with her obesity treatment plan along with follow-up of her obesity related diagnoses. Vickie Lane is on the [employer] and states she is following her eating plan approximately 75% of the time. Vickie Lane states she is doing strengthen for 15-30 minutes 3-4 times per week.  Today's visit was #: 3 Starting weight: 193 lbs Starting date: 09/06/2021 Today's weight: 190 lbs Today's date: 09/20/2021 Total lbs lost to date: 3 lbs Total lbs lost since last in-office visit: 3 lbs  Interim History: Vickie Lane is down 3 lbs since her last appointment. The plan was not that bad per Vickie Lane. She states that she eats more on the weekends.   Subjective:   1. Essential hypertension Vickie Lane is taking Hygroton, Catapres, Aladactone and Diovan currently. Her blood pressure was elevated today. She has taken her medication.  2. Diabetes mellitus type 2 in obese Vickie Lane) Vickie Lane is not taking any medications currently. Her Type 2 diabetes is well controlled. Her last A1C level was 5.9.  Assessment/Plan:   1. Essential hypertension Vickie Lane stated that her medication changes are in consultation  with her primary care provider and cardiologist. She think she has an appointment next week. She is working on healthy weight loss and exercise to improve blood pressure control. We will watch for signs of hypotension as she continues her lifestyle modifications.  2. Diabetes mellitus type 2 in obese [facility]) Vickie Lane will continue with plan and exercise. Good blood sugar control is important to decrease the likelihood of diabetic complications such as nephropathy, neuropathy, limb loss, blindness, coronary artery disease, and death.   3. Obesity BMI 30.7 Vickie Lane is currently in the action stage of change. As such, her goal is to continue with weight loss efforts. She has agreed to the [employer].   Vickie Lane will continue meal planning. We  discussed labs from 09/13/2021 CMP and glucose. 09/06/2021 Vitamin D and A1C. Handout: Eating out sheet was given.   Exercise goals:  Vickie Lane will increase exercise, strength training and will consider some cardio.  Behavioral modification strategies: increasing lean protein intake, decreasing simple carbohydrates, increasing vegetables, increasing water intake, decreasing eating out, no skipping meals, meal planning and cooking strategies, keeping healthy foods in the home, and planning for success.  Vickie Lane has agreed to follow-up with our clinic in 2 weeks. She was informed of the importance of frequent follow-up visits to maximize her success with intensive lifestyle modifications for her multiple health conditions.   Objective:   Blood pressure (!) 154/98, pulse 68, temperature 97.8 F (36.6 C), height 5\' 6"  (1.676 m), weight (P) 190 lb (86.2 kg), SpO2 97 %. Body mass index is 30.67 kg/m (pended).  General: Cooperative, alert, well developed, in no acute distress. HEENT: Conjunctivae and lids unremarkable. Cardiovascular: Regular rhythm.  Lungs: Normal work of breathing. Neurologic: No focal deficits.   Lab Results  Component Value Date   CREATININE 1.04 (H) 09/13/2021   BUN 15 09/13/2021   NA 140 09/13/2021   K 4.3 09/13/2021   CL 103 09/13/2021   CO2 22 09/13/2021   Lab Results  Component Value Date   ALT 16 03/29/2016   AST 19 03/29/2016   ALKPHOS 71 03/29/2016   BILITOT 0.3 03/29/2016   Lab Results  Component Value Date   HGBA1C 5.7 (H) 03/29/2016   Lab Results  Component Value Date   INSULIN 5.9 09/06/2021   Lab Results  Component Value Date   TSH  1.820 08/08/2021   Lab Results  Component Value Date   CHOL 170 03/29/2016   HDL 83 03/29/2016   LDLCALC 79 03/29/2016   TRIG 41 03/29/2016   CHOLHDL 2.0 03/29/2016   Lab Results  Component Value Date   VD25OH 71.7 09/06/2021   Lab Results  Component Value Date   WBC 4.3 03/29/2016   HGB 13.6  03/29/2016   HCT 40.5 03/29/2016   MCV 83.0 03/29/2016   PLT 272 03/29/2016   No results found for: IRON, TIBC, FERRITIN  Attestation Statements:   Reviewed by clinician on day of visit: allergies, medications, problem list, medical history, surgical history, family history, social history, and previous encounter notes.  I, Lizbeth Bark, RMA, am acting as Location manager for [employer], DO.   I have reviewed the above documentation for accuracy and completeness, and I agree with the above. Vickie Lesch, DO

## 2021-09-22 ENCOUNTER — Other Ambulatory Visit (HOSPITAL_BASED_OUTPATIENT_CLINIC_OR_DEPARTMENT_OTHER): Payer: Self-pay

## 2021-09-22 ENCOUNTER — Other Ambulatory Visit: Payer: Self-pay

## 2021-09-22 ENCOUNTER — Ambulatory Visit: Payer: No Typology Code available for payment source | Attending: Internal Medicine

## 2021-09-22 DIAGNOSIS — Z23 Encounter for immunization: Secondary | ICD-10-CM

## 2021-09-22 MED ORDER — PFIZER COVID-19 VAC BIVALENT 30 MCG/0.3ML IM SUSP
INTRAMUSCULAR | 0 refills | Status: DC
  Filled 2021-09-22: qty 0.3, 1d supply, fill #0

## 2021-09-22 NOTE — Progress Notes (Signed)
   Covid-19 Vaccination Clinic  Name:  Vickie Lane    MRN: [id number] DOB: Feb 05, 1961  09/22/2021  [name] was observed post Covid-19 immunization for 15 minutes without incident. She was provided with Vaccine Information Sheet and instruction to access the V-Safe system.   [name] was instructed to call 911 with any severe reactions post vaccine: Difficulty breathing  Swelling of face and throat  A fast heartbeat  A bad rash all over body  Dizziness and weakness   Immunizations Administered     Name Date Dose VIS Date Route   Pfizer Covid-19 Vaccine Bivalent Booster 09/22/2021  9:34 AM 0.3 mL 07/19/2021 Intramuscular   Manufacturer: [place]   Lot: [id number]   [place]: [phone]

## 2021-09-25 ENCOUNTER — Encounter (INDEPENDENT_AMBULATORY_CARE_PROVIDER_SITE_OTHER): Payer: Self-pay | Admitting: Bariatrics

## 2021-09-28 ENCOUNTER — Other Ambulatory Visit (HOSPITAL_COMMUNITY): Payer: Self-pay

## 2021-10-02 ENCOUNTER — Other Ambulatory Visit (HOSPITAL_COMMUNITY): Payer: Self-pay

## 2021-10-03 ENCOUNTER — Other Ambulatory Visit (HOSPITAL_COMMUNITY): Payer: Self-pay

## 2021-10-03 ENCOUNTER — Encounter: Payer: Self-pay | Admitting: *Deleted

## 2021-10-03 ENCOUNTER — Other Ambulatory Visit: Payer: Self-pay

## 2021-10-03 ENCOUNTER — Ambulatory Visit (INDEPENDENT_AMBULATORY_CARE_PROVIDER_SITE_OTHER): Payer: No Typology Code available for payment source | Admitting: Cardiovascular Disease

## 2021-10-03 VITALS — BP 152/92 | HR 55 | Ht 66.0 in | Wt 197.0 lb

## 2021-10-03 DIAGNOSIS — Z006 Encounter for examination for normal comparison and control in clinical research program: Secondary | ICD-10-CM

## 2021-10-03 DIAGNOSIS — E1169 Type 2 diabetes mellitus with other specified complication: Secondary | ICD-10-CM | POA: Diagnosis not present

## 2021-10-03 DIAGNOSIS — I152 Hypertension secondary to endocrine disorders: Secondary | ICD-10-CM

## 2021-10-03 DIAGNOSIS — E6609 Other obesity due to excess calories: Secondary | ICD-10-CM

## 2021-10-03 DIAGNOSIS — I10 Essential (primary) hypertension: Secondary | ICD-10-CM

## 2021-10-03 DIAGNOSIS — E1159 Type 2 diabetes mellitus with other circulatory complications: Secondary | ICD-10-CM

## 2021-10-03 DIAGNOSIS — E785 Hyperlipidemia, unspecified: Secondary | ICD-10-CM

## 2021-10-03 DIAGNOSIS — Z6831 Body mass index (BMI) 31.0-31.9, adult: Secondary | ICD-10-CM

## 2021-10-03 MED ORDER — HYDRALAZINE HCL 25 MG PO TABS
25.0000 mg | ORAL_TABLET | Freq: Two times a day (BID) | ORAL | 3 refills | Status: DC
  Filled 2021-10-03: qty 180, 90d supply, fill #0
  Filled 2022-01-04: qty 180, 90d supply, fill #1

## 2021-10-03 MED ORDER — HYDRALAZINE HCL 25 MG PO TABS
25.0000 mg | ORAL_TABLET | Freq: Three times a day (TID) | ORAL | 3 refills | Status: DC
  Filled 2021-10-03: qty 270, 90d supply, fill #0

## 2021-10-03 NOTE — Research (Signed)
Vickie Lane met criteria for the virtual care and social determinant interventions for the management of hypertension trial. The trial was discussed with her by Dr. Vader and myself including risks/benefits. She had the opportunity to read the consent and ask questions. The consent was signed and a copy was given to her. No study related procedures were performed prior to the consent being signed.  She was randomized to Group 2: Advanced hypertension clinic with remote patient monitoring. 

## 2021-10-03 NOTE — Patient Instructions (Signed)
Medication Instructions:  START HYDRALAZINE 25 MG TWICE A DAY    Labwork: RENIN/ALDOSTERONE FIRST THING ON A Monday MORNING  HOLD YOUR VALSARTAN AND SPIRONOLACTONE Saturday AND Sunday PRIOR TO LABS  ON Saturday AND Sunday TAKE HYDRALAZINE 4 TABLETS TWICE A DAY    Testing/Procedures: Your physician has requested that you have a renal artery duplex. During this test, an ultrasound is used to evaluate blood flow to the kidneys. Allow one hour for this exam. Do not eat after midnight the day before and avoid carbonated beverages. Take your medications as you usually do.  Follow-Up: 12/04/2021 AT 10:00 AM WITH PHARM D AT [place]   02/01/2022 AT 1:30 PM WITH DR [facility] AT [place] will receive a phone call from the PREP exercise and nutrition program to schedule an initial assessment.   Special Instructions:   MONITOR YOUR BLOOD PRESSURE TWICE A DAY WITH MACHINE PROVIDED, MAKE SURE YOU ARE LOGGED INTO APP  DASH Eating Plan DASH stands for "Dietary Approaches to Stop Hypertension." The DASH eating plan is a healthy eating plan that has been shown to reduce high blood pressure (hypertension). It may also reduce your risk for type 2 diabetes, heart disease, and stroke. The DASH eating plan may also help with weight loss. What are tips for following this plan?  General guidelines Avoid eating more than 2,300 mg (milligrams) of salt (sodium) a day. If you have hypertension, you may need to reduce your sodium intake to 1,500 mg a day. Limit alcohol intake to no more than 1 drink a day for nonpregnant women and 2 drinks a day for men. One drink equals 12 oz of beer, 5 oz of wine, or 1 oz of hard liquor. Work with your health care provider to maintain a healthy body weight or to lose weight. Ask what an ideal weight is for you. Get at least 30 minutes of exercise that causes your heart to beat faster (aerobic exercise) most days of the week. Activities may include  walking, swimming, or biking. Work with your health care provider or diet and nutrition specialist (dietitian) to adjust your eating plan to your individual calorie needs. Reading food labels  Check food labels for the amount of sodium per serving. Choose foods with less than 5 percent of the Daily Value of sodium. Generally, foods with less than 300 mg of sodium per serving fit into this eating plan. To find whole grains, look for the word "whole" as the first word in the ingredient list. Shopping Buy products labeled as "low-sodium" or "no salt added." Buy fresh foods. Avoid canned foods and premade or frozen meals. Cooking Avoid adding salt when cooking. Use salt-free seasonings or herbs instead of table salt or sea salt. Check with your health care provider or pharmacist before using salt substitutes. Do not fry foods. Cook foods using healthy methods such as baking, boiling, grilling, and broiling instead. Cook with heart-healthy oils, such as olive, canola, soybean, or sunflower oil. Meal planning Eat a balanced diet that includes: 5 or more servings of fruits and vegetables each day. At each meal, try to fill half of your plate with fruits and vegetables. Up to 6-8 servings of whole grains each day. Less than 6 oz of lean meat, poultry, or fish each day. A 3-oz serving of meat is about the same size as a deck of cards. One egg equals 1 oz. 2 servings of low-fat dairy each day. A serving of nuts, seeds, or  beans 5 times each week. Heart-healthy fats. Healthy fats called Omega-3 fatty acids are found in foods such as flaxseeds and coldwater fish, like sardines, salmon, and mackerel. Limit how much you eat of the following: Canned or prepackaged foods. Food that is high in trans fat, such as fried foods. Food that is high in saturated fat, such as fatty meat. Sweets, desserts, sugary drinks, and other foods with added sugar. Full-fat dairy products. Do not salt foods before  eating. Try to eat at least 2 vegetarian meals each week. Eat more home-cooked food and less restaurant, buffet, and fast food. When eating at a restaurant, ask that your food be prepared with less salt or no salt, if possible. What foods are recommended? The items listed may not be a complete list. Talk with your dietitian about what dietary choices are best for you. Grains Whole-grain or whole-wheat bread. Whole-grain or whole-wheat pasta. Brown rice. [place]. Bulgur. Whole-grain and low-sodium cereals. Pita bread. Low-fat, low-sodium crackers. Whole-wheat flour tortillas. Vegetables Fresh or frozen vegetables (raw, steamed, roasted, or grilled). Low-sodium or reduced-sodium tomato and vegetable juice. Low-sodium or reduced-sodium tomato sauce and tomato paste. Low-sodium or reduced-sodium canned vegetables. Fruits All fresh, dried, or frozen fruit. Canned fruit in natural juice (without added sugar). Meat and other protein foods Skinless chicken or [place]. Ground chicken or [place]. Pork with fat trimmed off. Fish and seafood. Egg whites. Dried beans, peas, or lentils. Unsalted nuts, nut butters, and seeds. Unsalted canned beans. Lean cuts of beef with fat trimmed off. Low-sodium, lean deli meat. Dairy Low-fat (1%) or fat-free (skim) milk. Fat-free, low-fat, or reduced-fat cheeses. Nonfat, low-sodium ricotta or cottage cheese. Low-fat or nonfat yogurt. Low-fat, low-sodium cheese. Fats and oils Soft margarine without trans fats. Vegetable oil. Low-fat, reduced-fat, or light mayonnaise and salad dressings (reduced-sodium). Canola, safflower, olive, soybean, and sunflower oils. Avocado. Seasoning and other foods Herbs. Spices. Seasoning mixes without salt. Unsalted popcorn and pretzels. Fat-free sweets. What foods are not recommended? The items listed may not be a complete list. Talk with your dietitian about what dietary choices are best for you. Grains Baked goods made with fat, such  as croissants, muffins, or some breads. Dry pasta or rice meal packs. Vegetables Creamed or fried vegetables. Vegetables in a cheese sauce. Regular canned vegetables (not low-sodium or reduced-sodium). Regular canned tomato sauce and paste (not low-sodium or reduced-sodium). Regular tomato and vegetable juice (not low-sodium or reduced-sodium). Angie Fava. Olives. Fruits Canned fruit in a light or heavy syrup. Fried fruit. Fruit in cream or butter sauce. Meat and other protein foods Fatty cuts of meat. Ribs. Fried meat. Berniece Salines. Sausage. Bologna and other processed lunch meats. Salami. Fatback. Hotdogs. Bratwurst. Salted nuts and seeds. Canned beans with added salt. Canned or smoked fish. Whole eggs or egg yolks. Chicken or [place] with skin. Dairy Whole or 2% milk, cream, and half-and-half. Whole or full-fat cream cheese. Whole-fat or sweetened yogurt. Full-fat cheese. Nondairy creamers. Whipped toppings. Processed cheese and cheese spreads. Fats and oils Butter. Stick margarine. Lard. Shortening. Ghee. Bacon fat. Tropical oils, such as coconut, palm kernel, or palm oil. Seasoning and other foods Salted popcorn and pretzels. Onion salt, garlic salt, seasoned salt, table salt, and sea salt. Worcestershire sauce. Tartar sauce. Barbecue sauce. Teriyaki sauce. Soy sauce, including reduced-sodium. Steak sauce. Canned and packaged gravies. Fish sauce. Oyster sauce. Cocktail sauce. Horseradish that you find on the shelf. Ketchup. Mustard. Meat flavorings and tenderizers. Bouillon cubes. Hot sauce and Tabasco sauce. Premade or packaged marinades. Premade  or packaged taco seasonings. Relishes. Regular salad dressings. Where to find more information: National Heart, Lung, and JAARS: https://wilson-eaton.com/ American Heart Association: www.heart.org Summary The DASH eating plan is a healthy eating plan that has been shown to reduce high blood pressure (hypertension). It may also reduce your risk for type 2  diabetes, heart disease, and stroke. With the DASH eating plan, you should limit salt (sodium) intake to 2,300 mg a day. If you have hypertension, you may need to reduce your sodium intake to 1,500 mg a day. When on the DASH eating plan, aim to eat more fresh fruits and vegetables, whole grains, lean proteins, low-fat dairy, and heart-healthy fats. Work with your health care provider or diet and nutrition specialist (dietitian) to adjust your eating plan to your individual calorie needs. This information is not intended to replace advice given to you by your health care provider. Make sure you discuss any questions you have with your health care provider. Document Released: 10/25/2011 Document Revised: 10/18/2017 Document Reviewed: 10/29/2016 Elsevier Patient Education  2020 [employer].

## 2021-10-03 NOTE — Progress Notes (Signed)
Advanced Hypertension Clinic Initial Assessment:    Date:  10/04/2021   ID:  Vickie Lane, DOB [birth year], MRN [id number]  PCP:  Kelton Pillar, MD  Cardiologist:  Jenkins Rouge, MD  Nephrologist:  Referring MD: Loel Dubonnet, NP   CC: Hypertension  History of Present Illness:    Vickie Lane is a 60 y.o. female with a hx of hypertension, hyperlipidemia, non-obstructive CAD, CKD stage 2, mitral valve prolapse, sickle cell trait, diabetes, and GERD here to establish care in the Advanced Hypertension Clinic.   She last saw Vickie Montana, NP on 08/08/21 for hypertension. Her blood pressure was 186/120. She reported having high blood pressure since she was 60 years old which was worsened with pregnancy and HELLP syndrome. Benazepril was switched to Benicar and Amlodipine was discontinued due to LE edema. At that visit, Olmesartan was discontinued and 320mg  Valsartan daily was started. Clonidine 0.2mg  twice daily was continued. Her blood pressure has been difficult to control over the years so she was referred to the hypertension clinic. She underwent cardiac CTA in 2017 with a calcium score of 34 placing her in 90th percentile for age and sex matched control. She had less than 50% disease which was non-obstructive in proximal LAD and D2 with <30% calcific disease in proximal and mid RCA. Echo from 08/2018 revealed normal function with LVEF 55-60%. Echo from 07/2021 showed no significant changes.   Today, she is doing well. 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 Her blood pressure issues began with her first pregnancy at 60 years old causing her to deliver early. Her second child also had to be delivered at [redacted] weeks gestation for similar reasons. Her children are 103 and 79 years old currently. This past year, her blood pressure has been increasingly more difficult to control. She records her blood pressure at home and usually gets measurements around [id number]  systolic. Since August, she is on a diet and weight loss program with the weight management clinic on [address]. She performs strength training exercises at [facility] weights and squats. A few years ago, she used to experience chest pain during exercising. However, she underwent a stress test and this pain has since been resolved. Her program has her eat mostly at home but she will eat out on the weekends occasionally. She does not drink caffeine or alcohol and does not add salt.  She endorses snoring and not waking up rested. However, she attributes these symptoms to a deviated septum and hot flashes. She uses estradiol patches to manage her hot flashes. Of note, NSAIDs upset her stomach. 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 She denies any palpitations, chest pain, shortness of breath, lightheadedness, headaches, syncope, orthopnea, PND, lower extremity edema or exertional symptoms.   Past Medical History:  Diagnosis Date   Allergy    Arthritis    "back" (11/07/2012)   Chest pain    Chronic lower back pain    Complication of anesthesia    somthing made her itch   Constipation    GERD (gastroesophageal reflux disease)    Heart murmur    "MVP" (`11/07/2012)   History of blood transfusion 1990's   "? w/one of my deliveries" (11/07/2012)   Hyperlipidemia    LDL   Hypertension 04/03/2010   Joint pain    Kidney problem    Lactose intolerance    Mitral valve prolapse    Seizures ([place]) 1991   "related to pregnancy; I had HELLP" (11/07/2012)-  lastseizure was 1991 per pt    Sickle cell trait ([place])    Systemic lupus erythematosus ([place]) 04/03/2010   joint pain   Type II diabetes mellitus ([place]) 08/2011   Diet and exercise.    Past Surgical History:  Procedure Laterality Date   ABDOMINAL HYSTERECTOMY  1997   CARPAL TUNNEL RELEASE  2008   "right w/fracture OR" (11/07/2012)   [place]  11/07/2012    "L4-5" (11/07/2012)   TRIGGER FINGER RELEASE Right 05/04/2016   Procedure: MINOR RELEASE TRIGGER FINGER/A-1 PULLEY;  Surgeon: Charlotte Crumb, MD;  Location: [place];  Service: Orthopedics;  Laterality: Right;   WRIST FRACTURE SURGERY  2008   "right" (11/07/2012)    Current Medications: Current Meds  Medication Sig   aspirin EC 81 MG tablet Take 81 mg by mouth every morning.   b complex vitamins capsule Take 1 capsule by mouth daily.   Cetirizine-Pseudoephedrine (ZYRTEC-D PO) Take 1 tablet by mouth daily as needed. For allergies   chlorthalidone (HYGROTON) 25 MG tablet Take 1 tablet (25 mg total) by mouth daily.   Cholecalciferol (VITAMIN D3 PO) Take 125 mcg by mouth daily.   cloNIDine (CATAPRES) 0.2 MG tablet Take 1 tablet by mouth 2 times a day   COVID-19 mRNA bivalent vaccine, Pfizer, (PFIZER COVID-19 VAC BIVALENT) injection Inject into the muscle.   esomeprazole (NEXIUM) 20 MG packet Take 20 mg by mouth as needed.   estradiol (CLIMARA) 0.1 mg/24hr patch Place 1 patch (0.1 mg total) onto the skin once a week.   FREESTYLE LITE test strip    glucose blood test strip    Lancets (FREESTYLE) lancets    meloxicam (MOBIC) 7.5 MG tablet Take 7.5 mg by mouth daily as needed for pain.   Multiple Vitamin (MULTIVITAMIN WITH MINERALS) TABS Take 1 tablet by mouth daily.   simvastatin (ZOCOR) 40 MG tablet Take 1 tablet (40 mg total) by mouth at bedtime.   spironolactone (ALDACTONE) 25 MG tablet Take 1 tablet by mouth once a day   triamcinolone cream (KENALOG) 0.5 % APPLY TO THE AFFECTED AREA(S) 2-3 TIMES A DAY   valACYclovir (VALTREX) 500 MG tablet Take 1 tablet (500 mg total) by mouth daily.   valsartan (DIOVAN) 320 MG tablet Take 1 tablet (320 mg total) by mouth daily.   vitamin B-12 (CYANOCOBALAMIN) 1000 MCG tablet Take 5,000 mcg by mouth daily.   [DISCONTINUED] hydrALAZINE (APRESOLINE) 25 MG tablet Take 1 tablet (25 mg total) by mouth 3 (three) times daily.     Allergies:    Ciprofloxacin, Codeine, Hydrocodone, Levofloxacin, Ultram [tramadol hcl], and Amlodipine   Social History   Socioeconomic History   Marital status: Single    Spouse name: Not on file   Number of children: Not on file   Years of education: Not on file   Highest education level: Not on file  Occupational History   Occupation: Lawyer: [employer]  Tobacco Use   Smoking status: Never   Smokeless tobacco: Never  Vaping Use   Vaping Use: Never used  Substance and Sexual Activity   Alcohol use: Yes    Comment: 11/07/2012 "once/month might have a glass of wine"   Drug use: No   Sexual activity: Yes    Birth control/protection: Surgical  Other Topics Concern   Not on file  Social History Narrative   Not on  file   Social Determinants of Health   Financial Resource Strain: Low Risk    Difficulty of Paying Living Expenses: Not hard at all  Food Insecurity: No Food Insecurity   Worried About Charity fundraiser in the Last Year: Never true   Arboriculturist in the Last Year: Never true  Transportation Needs: No Transportation Needs   Lack of Transportation (Medical): No   Lack of Transportation (Non-Medical): No  Physical Activity: Sufficiently Active   Days of Exercise per Week: 4 days   Minutes of Exercise per Session: 50 min  Stress: Not on file  Social Connections: Not on file     Family History: The patient's family history includes Breast cancer in her paternal aunt; Heart attack in her father; Heart disease in an other family member; Hypertension in her father, mother, and another family member; Sudden death in her father. There is no history of Colon cancer, Colon polyps, Rectal cancer, Stomach cancer, or Esophageal cancer.  ROS:   Please see the history of present illness.    (+) Snores (+) Hot flashes All other systems reviewed and are negative.  EKGs/Labs/Other Studies Reviewed:    Echo 08/16/21 1. Left ventricular ejection fraction, by  estimation, is 55 to 60%. The  left ventricle has normal function. The left ventricle has no regional  wall motion abnormalities. Left ventricular diastolic parameters are  indeterminate.   2. Right ventricular systolic function is normal. The right ventricular  size is normal. There is normal pulmonary artery systolic pressure. The  estimated right ventricular systolic pressure is 06.3 mmHg.   3. The mitral valve is grossly normal. Trivial mitral valve  regurgitation. No evidence of mitral stenosis.   4. The aortic valve is tricuspid. Aortic valve regurgitation is not  visualized. No aortic stenosis is present.   5. The inferior vena cava is normal in size with greater than 50%  respiratory variability, suggesting right atrial pressure of 3 mmHg.  Comparison(s): No significant change from prior study.   Nuclear Stress Test 09/17/18 Nuclear stress EF: 55%. There was no ST segment deviation noted during stress. The study is normal. This is a low risk study. The left ventricular ejection fraction is normal (55-65%). Normal perfusion EF 55% Baseline ECG with inferolateral T wave changes deemed non diagnostic during stress  Echo 09/17/18 - Left ventricle: The cavity size was normal. Wall thickness was normal. Systolic function was normal. The estimated ejection fraction was in the range of 55% to 60%. Left ventricular diastolic function parameters were normal.  - Aortic valve: Sclerosis without stenosis.  - Mitral valve: Calcified annulus. Mildly thickened leaflets .  - Left atrium: The atrium was mildly dilated.  - Atrial septum: No defect or patent foramen ovale was identified.  - Pulmonary arteries: PA peak pressure: 32 mm Hg (S).  CT Coronary Morph with Ca Score 04/04/16 IMPRESSION: 1) Calcium Score 36 90th percentile for age and sex matched controls 2) Less than 50% calcific non obstructive disease in proximal LAD and D2 with less than 30% calcific disease in proximal and mid  RCA 3) Normal aortic root  FINDINGS: Within the visualized portions of the thorax there are no suspicious appearing pulmonary nodules or masses, there is no acute consolidative airspace disease, no pleural effusions, no pneumothorax, and no lymphadenopathy. Visualized portions of the upper abdomen are unremarkable. There are no aggressive appearing lytic or blastic lesions noted in the visualized portions of the skeleton. IMPRESSION: 1. No significant incidental  noncardiac findings are noted.   EKG:  EKG was not ordered today  Recent Labs: 08/08/2021: TSH 1.820 09/13/2021: BUN 15; Creatinine, Ser 1.04; Potassium 4.3; Sodium 140   Recent Lipid Panel    Component Value Date/Time   CHOL 170 03/29/2016 0847   TRIG 41 03/29/2016 0847   HDL 83 03/29/2016 0847   CHOLHDL 2.0 03/29/2016 0847   VLDL 8 03/29/2016 0847   LDLCALC 79 03/29/2016 0847    Physical Exam:   VS:  BP (!) 152/92 (BP Location: Right Arm, Patient Position: Sitting, Cuff Size: Large)   Pulse (!) 55   Ht 5\' 6"  (1.676 m)   Wt 197 lb (89.4 kg)   SpO2 99%   BMI 31.80 kg/m  , BMI Body mass index is 31.8 kg/m. GENERAL:  Well appearing HEENT: Pupils equal round and reactive, fundi not visualized, oral mucosa unremarkable NECK:  No jugular venous distention, waveform within normal limits, carotid upstroke brisk and symmetric, no bruits, no thyromegaly LUNGS:  Clear to auscultation bilaterally HEART:  RRR.  PMI not displaced or sustained,S1 and S2 within normal limits, no S3, no S4, no clicks, no rubs, no murmurs ABD:  Flat, positive bowel sounds normal in frequency in pitch, no bruits, no rebound, no guarding, no midline pulsatile mass, no hepatomegaly, no splenomegaly EXT:  2 plus pulses throughout, no edema, no cyanosis no clubbing SKIN:  No rashes no nodules NEURO:  Cranial nerves II through XII grossly intact, motor grossly intact throughout PSYCH:  Cognitively intact, oriented to person place and  time   ASSESSMENT/PLAN:    Hypertension associated with diabetes ([place]) [name] has resistant blood pressure.  Her blood pressure is poorly controlled on multiple agents.  We will do a work-up for secondary causes.  She will hold her ARB and spironolactone for 2 days and then come for renal and and aldosterone levels.  During this time she will take 100 mg of hydralazine twice a day.  After the testing.  She will add 25 mg of hydralazine twice daily to her regimen.  Continue chlorthalidone, clonidine, valsartan, and spironolactone.  We will check renal artery Dopplers.  We will also refer her to the PR EP program to the [facility].  She is interested in our remote monitoring trial and consents to monitoring in the [facility] remote patient monitoring system.  Hyperlipidemia associated with type 2 diabetes mellitus (HCC) Continue simvastatin.  Class 1 obesity due to excess calories with body mass index (BMI) of 31.0 to 31.9 in adult Continue working on diet and exercise.  Referral to the PREP exercise program to [facility].  Recommend at least 150 minutes of exercise weekly.   Screening for Secondary Hypertension:  Causes 10/03/2021  Drugs/Herbals Screened     - Comments limits sodium. no caffeine.  Rare EtOH.  No more NSAIDS  Renovascular HTN Screened     - Comments Check renal artery Dopplers  Sleep Apnea Screened  Thyroid Disease Screened  Hyperaldosteronism Screened     - Comments Check renin and aldosterone  Pheochromocytoma N/A  Cushing's Syndrome N/A  Hyperparathyroidism Screened  Coarctation of the Aorta Screened     - Comments BP symmetric  Compliance Screened    Relevant Labs/Studies: Basic Labs Latest Ref Rng & Units 09/13/2021 09/04/2021 08/16/2021  Sodium 134 - 144 mmol/L 140 141 141  Potassium 3.5 - 5.2 mmol/L 4.3 5.2 4.9  Creatinine 0.57 - 1.00 mg/dL 1.04(H) 1.16(H) 0.91    Thyroid  Latest Ref Rng & Units  08/08/2021  TSH 0.450 - 4.500 uIU/mL 1.820              Renovascular  10/03/2021  Renal Artery [place] Completed Yes        Disposition:    FU with MD/PharmD in 1 months  FU with Shannyn Jankowiak C. Oval Linsey, MD, [facility] in 4 months    Medication Adjustments/Labs and Tests Ordered: Current medicines are reviewed at length with the patient today.  Concerns regarding medicines are outlined above.  Orders Placed This Encounter  Procedures   Aldosterone + renin activity w/ ratio   Amb Referral To Provider Referral Exercise Program (P.R.E.P)   VAS US RENAL ARTERY DUPLEX    Meds ordered this encounter  Medications   DISCONTD: hydrALAZINE (APRESOLINE) 25 MG tablet    Sig: Take 1 tablet (25 mg total) by mouth 3 (three) times daily.    Dispense:  270 tablet    Refill:  3   hydrALAZINE (APRESOLINE) 25 MG tablet    Sig: Take 1 tablet (25 mg total) by mouth 2 (two) times daily at 8 am and 10 pm.    Dispense:  180 tablet    Refill:  3    D/C THREE TIMES A DAY RX    I,Mykaella Javier,acting as a scribe for Skeet Latch, MD.,have documented all relevant documentation on the behalf of Skeet Latch, MD,as directed by  Skeet Latch, MD while in the presence of Skeet Latch, MD.  I, [place] Oval Linsey, MD have reviewed all documentation for this visit.  The documentation of the exam, diagnosis, procedures, and orders on 10/04/2021 are all accurate and complete.   Signed, Skeet Latch, MD  10/04/2021 9:14 AM    [place]

## 2021-10-04 ENCOUNTER — Encounter (HOSPITAL_BASED_OUTPATIENT_CLINIC_OR_DEPARTMENT_OTHER): Payer: Self-pay | Admitting: Cardiovascular Disease

## 2021-10-04 ENCOUNTER — Telehealth: Payer: Self-pay

## 2021-10-04 ENCOUNTER — Other Ambulatory Visit (HOSPITAL_COMMUNITY): Payer: Self-pay

## 2021-10-04 DIAGNOSIS — Z0000 Encounter for general adult medical examination without abnormal findings: Secondary | ICD-10-CM

## 2021-10-04 NOTE — Assessment & Plan Note (Addendum)
[name] has resistant blood pressure.  Her blood pressure is poorly controlled on multiple agents.  We will do a work-up for secondary causes.  She will hold her ARB and spironolactone for 2 days and then come for renal and and aldosterone levels.  During this time she will take 100 mg of hydralazine twice a day.  After the testing.  She will add 25 mg of hydralazine twice daily to her regimen.  Continue chlorthalidone, clonidine, valsartan, and spironolactone.  We will check renal artery Dopplers.  We will also refer her to the PR EP program to the [facility].  She is interested in our remote monitoring trial and consents to monitoring in the [place] remote patient monitoring system.

## 2021-10-04 NOTE — Assessment & Plan Note (Signed)
Continue working on diet and exercise.  Referral to the PREP exercise program to the [facility].  Recommend at least 150 minutes of exercise weekly.

## 2021-10-04 NOTE — Telephone Encounter (Signed)
Called patient for Vivify 24-hour follow-up call. Patient did not answer. Left a message for patient to return call.   Armarion Greek Truman Hayward, [facility] Guide, Health Coach [address] [place] [address] Telephone: [phone] Email: [email or web address]

## 2021-10-04 NOTE — Assessment & Plan Note (Signed)
Continue simvastatin. 

## 2021-10-05 ENCOUNTER — Ambulatory Visit (INDEPENDENT_AMBULATORY_CARE_PROVIDER_SITE_OTHER): Payer: No Typology Code available for payment source | Admitting: Bariatrics

## 2021-10-06 ENCOUNTER — Telehealth: Payer: Self-pay

## 2021-10-06 NOTE — Telephone Encounter (Signed)
Call to pt reference PREP class Already has a work out regimen she is settling back into.  Has been doing strength training at gym 3x week  Just getting back to cardio after recovering from achilles injury Does have a weighted hula hoop she has started to use as well 10 min at a time Reminded 150 min of cardio every week is the goal Encouraged to call me back if I can help her

## 2021-10-13 ENCOUNTER — Encounter (HOSPITAL_BASED_OUTPATIENT_CLINIC_OR_DEPARTMENT_OTHER): Payer: Self-pay

## 2021-10-13 DIAGNOSIS — I10 Essential (primary) hypertension: Secondary | ICD-10-CM

## 2021-10-15 LAB — ALDOSTERONE + RENIN ACTIVITY W/ RATIO
ALDOS/RENIN RATIO: 68.4 — ABNORMAL HIGH (ref 0.0–30.0)
ALDOSTERONE: 18.6 ng/dL (ref 0.0–30.0)
Renin: 0.272 ng/mL/hr (ref 0.167–5.380)

## 2021-10-15 MED ORDER — VALSARTAN 320 MG PO TABS
320.0000 mg | ORAL_TABLET | Freq: Every day | ORAL | 1 refills | Status: DC
  Filled 2021-10-15: qty 90, 90d supply, fill #0
  Filled 2021-11-28 – 2022-01-16 (×2): qty 90, 90d supply, fill #1
  Filled ????-??-??: fill #1

## 2021-10-16 ENCOUNTER — Other Ambulatory Visit (HOSPITAL_COMMUNITY): Payer: Self-pay

## 2021-10-17 ENCOUNTER — Telehealth: Payer: Self-pay | Admitting: Pharmacist Clinician (PhC)/ Clinical Pharmacy Specialist

## 2021-10-17 NOTE — Telephone Encounter (Signed)
Spoke with patient - she ran out of valsartan after Sunday dose, was able to pick up today and restarted.   BP readings were looking good until yesterday.  Will continue to monitor.   Explained results of renin/aldosterone labs.  She is scheduled for imaging on Dec 6

## 2021-10-18 ENCOUNTER — Other Ambulatory Visit (HOSPITAL_COMMUNITY): Payer: Self-pay

## 2021-10-19 ENCOUNTER — Other Ambulatory Visit: Payer: Self-pay

## 2021-10-19 ENCOUNTER — Other Ambulatory Visit (HOSPITAL_COMMUNITY): Payer: Self-pay

## 2021-10-24 ENCOUNTER — Ambulatory Visit (INDEPENDENT_AMBULATORY_CARE_PROVIDER_SITE_OTHER): Payer: No Typology Code available for payment source

## 2021-10-24 ENCOUNTER — Telehealth (HOSPITAL_BASED_OUTPATIENT_CLINIC_OR_DEPARTMENT_OTHER): Payer: Self-pay | Admitting: *Deleted

## 2021-10-24 ENCOUNTER — Other Ambulatory Visit: Payer: Self-pay

## 2021-10-24 ENCOUNTER — Encounter (HOSPITAL_BASED_OUTPATIENT_CLINIC_OR_DEPARTMENT_OTHER): Payer: Self-pay

## 2021-10-24 DIAGNOSIS — I10 Essential (primary) hypertension: Secondary | ICD-10-CM | POA: Diagnosis not present

## 2021-10-24 DIAGNOSIS — R7989 Other specified abnormal findings of blood chemistry: Secondary | ICD-10-CM

## 2021-10-24 NOTE — Telephone Encounter (Signed)
Advised patient, verbalized understanding Order placed and message to scheduling to arrange

## 2021-10-24 NOTE — Telephone Encounter (Signed)
-----   Message from Skeet Latch, MD sent at 10/17/2021  9:27 AM EST ----- Labs show that she may have an overactivity of aldosterone leading to her blood pressure running higher.  Recommend getting a CT of the abdomen with and without contrast to look for adrenal adenomas.

## 2021-10-27 ENCOUNTER — Telehealth (HOSPITAL_BASED_OUTPATIENT_CLINIC_OR_DEPARTMENT_OTHER): Payer: Self-pay | Admitting: Cardiovascular Disease

## 2021-10-27 NOTE — Telephone Encounter (Signed)
Left message regarding Friday 11/03/21 9:00 am CTA abdomen and pelvis at [facility]--[address] --ground floor--arrival time is 8:45 am for check in ---liquids only 4 hours prior to study.  Requested patient call with questions/concerns of if she needs to reschedule .

## 2021-10-28 LAB — BASIC METABOLIC PANEL
BUN/Creatinine Ratio: 15 (ref 9–23)
BUN: 15 mg/dL (ref 6–24)
CO2: 25 mmol/L (ref 20–29)
Calcium: 10.7 mg/dL — ABNORMAL HIGH (ref 8.7–10.2)
Chloride: 105 mmol/L (ref 96–106)
Creatinine, Ser: 1.03 mg/dL — ABNORMAL HIGH (ref 0.57–1.00)
Glucose: 98 mg/dL (ref 70–99)
Potassium: 4 mmol/L (ref 3.5–5.2)
Sodium: 143 mmol/L (ref 134–144)
eGFR: 63 mL/min/{1.73_m2} (ref >59)

## 2021-11-02 DIAGNOSIS — I10 Essential (primary) hypertension: Secondary | ICD-10-CM | POA: Diagnosis not present

## 2021-11-03 ENCOUNTER — Other Ambulatory Visit: Payer: Self-pay

## 2021-11-03 ENCOUNTER — Ambulatory Visit (HOSPITAL_BASED_OUTPATIENT_CLINIC_OR_DEPARTMENT_OTHER)
Admission: RE | Admit: 2021-11-03 | Discharge: 2021-11-03 | Disposition: A | Discharge status: Home / Self Care | Payer: No Typology Code available for payment source | Source: Ambulatory Visit | Attending: Cardiovascular Disease | Admitting: Cardiovascular Disease

## 2021-11-03 DIAGNOSIS — I10 Essential (primary) hypertension: Secondary | ICD-10-CM | POA: Insufficient documentation

## 2021-11-03 DIAGNOSIS — R7989 Other specified abnormal findings of blood chemistry: Secondary | ICD-10-CM | POA: Insufficient documentation

## 2021-11-03 MED ORDER — IOHEXOL 350 MG/ML SOLN
100.0000 mL | Freq: Once | INTRAVENOUS | Status: AC | PRN
  Administered 2021-11-03: 100 mL via INTRAVENOUS

## 2021-11-06 ENCOUNTER — Encounter (INDEPENDENT_AMBULATORY_CARE_PROVIDER_SITE_OTHER): Payer: Self-pay | Admitting: Bariatrics

## 2021-11-06 ENCOUNTER — Ambulatory Visit (INDEPENDENT_AMBULATORY_CARE_PROVIDER_SITE_OTHER): Payer: No Typology Code available for payment source | Admitting: Bariatrics

## 2021-11-06 ENCOUNTER — Other Ambulatory Visit: Payer: Self-pay

## 2021-11-06 VITALS — BP 113/77 | HR 64 | Temp 97.9°F | Ht 66.0 in | Wt 186.0 lb

## 2021-11-06 DIAGNOSIS — E119 Type 2 diabetes mellitus without complications: Secondary | ICD-10-CM | POA: Insufficient documentation

## 2021-11-06 DIAGNOSIS — I152 Hypertension secondary to endocrine disorders: Secondary | ICD-10-CM | POA: Diagnosis not present

## 2021-11-06 DIAGNOSIS — E1159 Type 2 diabetes mellitus with other circulatory complications: Secondary | ICD-10-CM

## 2021-11-06 DIAGNOSIS — E1169 Type 2 diabetes mellitus with other specified complication: Secondary | ICD-10-CM

## 2021-11-06 DIAGNOSIS — Z6831 Body mass index (BMI) 31.0-31.9, adult: Secondary | ICD-10-CM

## 2021-11-06 DIAGNOSIS — E669 Obesity, unspecified: Secondary | ICD-10-CM | POA: Diagnosis not present

## 2021-11-06 NOTE — Progress Notes (Signed)
Chief Complaint:   OBESITY Vickie Lane is here to discuss her progress with her obesity treatment plan along with follow-up of her obesity related diagnoses. Vickie Lane is on the [employer] and states she is following her eating plan approximately 100% of the time. Vickie Lane states she is using the treadmill, doing strength training and the stationary bike for 30 minutes 3 times per week.  Today's visit was #: 4 Starting weight: 193 lbs Starting date: 09/06/2021 Today's weight: 186 lbs Today's date: 11/06/2021 Total lbs lost to date: 7 lbs Total lbs lost since last in-office visit: 4 lbs  Interim History: Vickie Lane is down an additional 4 lbs and doing okay overall. She has been drinking egg nog. She is trying to get in her water.  Subjective:   1. Hypertension associated with type 2 diabetes mellitus ([facility]) Vickie Lane is taking HCTZ, Catapres and Apresoline. Her blood pressure is controlled.  2. Type 2 diabetes mellitus with other specified complication, without long-term current use of insulin (HCC) Vickie Lane's Type 2 diabetes mellitus is controlled.    Assessment/Plan:   1. Hypertension associated with type 2 diabetes mellitus ([place]) Vickie Lane will continue her medications. She is working on healthy weight loss and exercise to improve blood pressure control. We will watch for signs of hypotension as she continues her lifestyle modifications.  2. Type 2 diabetes mellitus with other specified complication, without long-term current use of insulin (HCC) Vickie Lane will continue to decrease carbohydrates and she will increase healthy fats and protein. Good blood sugar control is important to decrease the likelihood of diabetic complications such as nephropathy, neuropathy, limb loss, blindness, coronary artery disease, and death. Intensive lifestyle modification including diet, exercise and weight loss are the first line of treatment for diabetes.   3. Obesity, current BMI 30.1 Vickie Lane is  currently in the action stage of change. As such, her goal is to continue with weight loss efforts. She has agreed to the [employer].   Vickie Lane will keep protein high. She will adhere to the plan closely.  Exercise goals:  As is.  Behavioral modification strategies: increasing lean protein intake, decreasing simple carbohydrates, increasing vegetables, increasing water intake, decreasing eating out, no skipping meals, meal planning and cooking strategies, keeping healthy foods in the home, and planning for success.  Vickie Lane has agreed to follow-up with our clinic in 2 weeks. She was informed of the importance of frequent follow-up visits to maximize her success with intensive lifestyle modifications for her multiple health conditions.   Objective:   Blood pressure 113/77, pulse 64, temperature 97.9 F (36.6 C), height 5\' 6"  (1.676 m), weight 186 lb (84.4 kg), SpO2 100 %. Body mass index is 30.02 kg/m.  General: Cooperative, alert, well developed, in no acute distress. HEENT: Conjunctivae and lids unremarkable. Cardiovascular: Regular rhythm.  Lungs: Normal work of breathing. Neurologic: No focal deficits.   Lab Results  Component Value Date   CREATININE 1.03 (H) 10/27/2021   BUN 15 10/27/2021   NA 143 10/27/2021   K 4.0 10/27/2021   CL 105 10/27/2021   CO2 25 10/27/2021   Lab Results  Component Value Date   ALT 16 03/29/2016   AST 19 03/29/2016   ALKPHOS 71 03/29/2016   BILITOT 0.3 03/29/2016   Lab Results  Component Value Date   HGBA1C 5.7 (H) 03/29/2016   Lab Results  Component Value Date   INSULIN 5.9 09/06/2021   Lab Results  Component Value Date   TSH 1.820 08/08/2021   Lab  Results  Component Value Date   CHOL 170 03/29/2016   HDL 83 03/29/2016   LDLCALC 79 03/29/2016   TRIG 41 03/29/2016   CHOLHDL 2.0 03/29/2016   Lab Results  Component Value Date   VD25OH 71.7 09/06/2021   Lab Results  Component Value Date   WBC 4.3 03/29/2016   HGB 13.6  03/29/2016   HCT 40.5 03/29/2016   MCV 83.0 03/29/2016   PLT 272 03/29/2016   No results found for: IRON, TIBC, FERRITIN  Attestation Statements:   Reviewed by clinician on day of visit: allergies, medications, problem list, medical history, surgical history, family history, social history, and previous encounter notes.  I, Lizbeth Bark, RMA, am acting as Location manager for [employer], DO.  I have reviewed the above documentation for accuracy and completeness, and I agree with the above. Jearld Lesch, DO

## 2021-11-07 ENCOUNTER — Encounter (INDEPENDENT_AMBULATORY_CARE_PROVIDER_SITE_OTHER): Payer: Self-pay | Admitting: Bariatrics

## 2021-11-15 ENCOUNTER — Other Ambulatory Visit (HOSPITAL_COMMUNITY): Payer: Self-pay

## 2021-11-15 ENCOUNTER — Telehealth: Payer: Self-pay

## 2021-11-15 DIAGNOSIS — Z0000 Encounter for general adult medical examination without abnormal findings: Secondary | ICD-10-CM

## 2021-11-15 NOTE — Telephone Encounter (Signed)
Called patient to determine how she was feeling, had taken her meds before checking bp, or if there had been any changes in her diet that may be related to her elevated bp reading today. Left patient a message to return call.    Jimel Myler Truman Hayward, [facility] Guide, Health Coach [address] [place] [address] Telephone: [phone] Email: [email or web address]

## 2021-11-27 ENCOUNTER — Other Ambulatory Visit (HOSPITAL_COMMUNITY): Payer: Self-pay

## 2021-11-27 ENCOUNTER — Telehealth: Payer: Self-pay

## 2021-11-27 DIAGNOSIS — Z0000 Encounter for general adult medical examination without abnormal findings: Secondary | ICD-10-CM

## 2021-11-27 NOTE — Telephone Encounter (Signed)
Called patient to discuss health coaching for improving sleep per survey response in [place]. Patient did not answer. Left message for patient to return call to discuss health coaching more in detail.    Starlet Gallentine Truman Hayward, [facility] Guide, Health Coach [address] [place] [address] Telephone: [phone] Email: [email or web address]

## 2021-11-28 ENCOUNTER — Other Ambulatory Visit (HOSPITAL_COMMUNITY): Payer: Self-pay

## 2021-11-29 ENCOUNTER — Ambulatory Visit (INDEPENDENT_AMBULATORY_CARE_PROVIDER_SITE_OTHER): Payer: No Typology Code available for payment source | Admitting: Bariatrics

## 2021-11-30 ENCOUNTER — Ambulatory Visit (INDEPENDENT_AMBULATORY_CARE_PROVIDER_SITE_OTHER): Payer: No Typology Code available for payment source | Admitting: Bariatrics

## 2021-11-30 ENCOUNTER — Other Ambulatory Visit: Payer: Self-pay

## 2021-11-30 ENCOUNTER — Encounter (INDEPENDENT_AMBULATORY_CARE_PROVIDER_SITE_OTHER): Payer: Self-pay | Admitting: Bariatrics

## 2021-11-30 ENCOUNTER — Other Ambulatory Visit (HOSPITAL_COMMUNITY): Payer: Self-pay

## 2021-11-30 VITALS — BP 124/81 | HR 68 | Temp 98.0°F | Ht 66.0 in | Wt 187.0 lb

## 2021-11-30 DIAGNOSIS — E669 Obesity, unspecified: Secondary | ICD-10-CM | POA: Diagnosis not present

## 2021-11-30 DIAGNOSIS — E1169 Type 2 diabetes mellitus with other specified complication: Secondary | ICD-10-CM | POA: Diagnosis not present

## 2021-11-30 DIAGNOSIS — Z6830 Body mass index (BMI) 30.0-30.9, adult: Secondary | ICD-10-CM

## 2021-11-30 DIAGNOSIS — I10 Essential (primary) hypertension: Secondary | ICD-10-CM | POA: Diagnosis not present

## 2021-11-30 MED ORDER — TIRZEPATIDE 2.5 MG/0.5ML ~~LOC~~ SOAJ
2.5000 mg | SUBCUTANEOUS | 0 refills | Status: DC
  Filled 2021-11-30 – 2021-12-07 (×2): qty 2, 28d supply, fill #0

## 2021-12-02 ENCOUNTER — Other Ambulatory Visit (HOSPITAL_COMMUNITY): Payer: Self-pay

## 2021-12-02 DIAGNOSIS — I10 Essential (primary) hypertension: Secondary | ICD-10-CM | POA: Diagnosis not present

## 2021-12-04 ENCOUNTER — Encounter (INDEPENDENT_AMBULATORY_CARE_PROVIDER_SITE_OTHER): Payer: Self-pay | Admitting: Bariatrics

## 2021-12-04 ENCOUNTER — Ambulatory Visit (INDEPENDENT_AMBULATORY_CARE_PROVIDER_SITE_OTHER)
Payer: No Typology Code available for payment source | Admitting: Pharmacist Clinician (PhC)/ Clinical Pharmacy Specialist

## 2021-12-04 ENCOUNTER — Other Ambulatory Visit: Payer: Self-pay

## 2021-12-04 ENCOUNTER — Other Ambulatory Visit (HOSPITAL_COMMUNITY): Payer: Self-pay

## 2021-12-04 DIAGNOSIS — E1159 Type 2 diabetes mellitus with other circulatory complications: Secondary | ICD-10-CM

## 2021-12-04 DIAGNOSIS — I152 Hypertension secondary to endocrine disorders: Secondary | ICD-10-CM | POA: Diagnosis not present

## 2021-12-04 NOTE — Assessment & Plan Note (Signed)
Patient with elevated aldosterone/renin ratio, currently BP controlled on multiple medications.  She seems to have symptomatic hypotension with readings < 937 systolic and also notes dry mouth with clonidine use.  Will have her cut the evening clonidine to 0.1 mg (1/2 tablet) for 1 week, then if BP stable, she will cut to 0.1 mg bid.  Hope to keep BP in range with fewer low readings.  She will continue with regular home Vivify monitoring and will follow up with Dr. Oval Linsey in 2 months.

## 2021-12-04 NOTE — Patient Instructions (Signed)
Return for a a follow up appointment with Dr. Oval Linsey in March  Check your blood pressure at home daily and keep record of the readings.  Take your BP meds as follows:  Decrease the clonidine to 1/2 tablet (0.1 mg) at night for about a week, and if AM blood pressure readings look good then decrease the morning clonidine to 1/2 tablet as well.     Continue with all other medications.    Bring all of your meds, your BP cuff and your record of home blood pressures to your next appointment.  Exercise as youre able, try to walk approximately 30 minutes per day.  Keep salt intake to a minimum, especially watch canned and prepared boxed foods.  Eat more fresh fruits and vegetables and fewer canned items.  Avoid eating in fast food restaurants.    HOW TO TAKE YOUR BLOOD PRESSURE: Rest 5 minutes before taking your blood pressure.  Dont smoke or drink caffeinated beverages for at least 30 minutes before. Take your blood pressure before (not after) you eat. Sit comfortably with your back supported and both feet on the floor (dont cross your legs). Elevate your arm to heart level on a table or a desk. Use the proper sized cuff. It should fit smoothly and snugly around your bare upper arm. There should be enough room to slip a fingertip under the cuff. The bottom edge of the cuff should be 1 inch above the crease of the elbow. Ideally, take 3 measurements at one sitting and record the average.

## 2021-12-04 NOTE — Progress Notes (Signed)
Chief Complaint:   OBESITY Vickie Lane is here to discuss her progress with her obesity treatment plan along with follow-up of her obesity related diagnoses. Vickie Lane is on the [employer] and states she is following her eating plan approximately 50% of the time. Vickie Lane states she is doing strengthen exercise for 30 minutes 3-4 times per week.  Today's visit was #: 5 Starting weight: 193 lbs Starting date: 09/06/2021 Today's weight: 187 lbs Today's date: 11/30/2021 Total lbs lost to date: 6 lbs Total lbs lost since last in-office visit: 0  Interim History: Vickie Lane is up 1 lb over the holidays.  Subjective:   1. Type 2 diabetes mellitus with other specified complication, without long-term current use of insulin (HCC) Vickie Lane has no contraindications to [facility]. She is currently not on medications and has never taken medications. Authorization from [employer] out patient. She notes some hunger. She has increased carbohydrates.  2. Essential hypertension Vickie Lane's blood pressure is controlled. Vickie Lane's last blood pressure was 113/77.  Assessment/Plan:   1. Type 2 diabetes mellitus with other specified complication, without long-term current use of insulin (HCC) We will refill Mounjaro 2.5 mg with no refills. Good blood sugar control is important to decrease the likelihood of diabetic complications such as nephropathy, neuropathy, limb loss, blindness, coronary artery disease, and death. Intensive lifestyle modification including diet, exercise and weight loss are the first line of treatment for diabetes.   - tirzepatide [facility]) 2.5 MG/0.5ML Pen; Inject 2.5 mg into the skin once a week.  Dispense: 2 mL; Refill: 0  2. Essential hypertension Arwa will continue taking her medications. She is working on healthy weight loss and exercise to improve blood pressure control. We will watch for signs of hypotension as she continues her lifestyle modifications.  3. Obesity, current  BMI 30.2 Vickie Lane is currently in the action stage of change. As such, her goal is to continue with weight loss efforts. She has agreed to the [employer].   Vickie Lane will adhere more closely to the plan. She will stop all sweets and minimize carbohydrates. She will increase her water intake.  Exercise goals:  As is.  Behavioral modification strategies: increasing lean protein intake, decreasing simple carbohydrates, increasing vegetables, decreasing eating out, no skipping meals, meal planning and cooking strategies, ways to avoid night time snacking, better snacking choices, and planning for success.  Vickie Lane has agreed to follow-up with our clinic in 3 weeks the first week in February. She was informed of the importance of frequent follow-up visits to maximize her success with intensive lifestyle modifications for her multiple health conditions.   Objective:   Blood pressure 124/81, pulse 68, temperature 98 F (36.7 C), height 5\' 6"  (1.676 m), weight 187 lb (84.8 kg), SpO2 100 %. Body mass index is 30.18 kg/m.  General: Cooperative, alert, well developed, in no acute distress. HEENT: Conjunctivae and lids unremarkable. Cardiovascular: Regular rhythm.  Lungs: Normal work of breathing. Neurologic: No focal deficits.   Lab Results  Component Value Date   CREATININE 1.03 (H) 10/27/2021   BUN 15 10/27/2021   NA 143 10/27/2021   K 4.0 10/27/2021   CL 105 10/27/2021   CO2 25 10/27/2021   Lab Results  Component Value Date   ALT 16 03/29/2016   AST 19 03/29/2016   ALKPHOS 71 03/29/2016   BILITOT 0.3 03/29/2016   Lab Results  Component Value Date   HGBA1C 5.7 (H) 03/29/2016   Lab Results  Component Value Date   INSULIN 5.9  09/06/2021   Lab Results  Component Value Date   TSH 1.820 08/08/2021   Lab Results  Component Value Date   CHOL 170 03/29/2016   HDL 83 03/29/2016   LDLCALC 79 03/29/2016   TRIG 41 03/29/2016   CHOLHDL 2.0 03/29/2016   Lab Results   Component Value Date   VD25OH 71.7 09/06/2021   Lab Results  Component Value Date   WBC 4.3 03/29/2016   HGB 13.6 03/29/2016   HCT 40.5 03/29/2016   MCV 83.0 03/29/2016   PLT 272 03/29/2016   No results found for: IRON, TIBC, FERRITIN  Attestation Statements:   Reviewed by clinician on day of visit: allergies, medications, problem list, medical history, surgical history, family history, social history, and previous encounter notes.  I, Lizbeth Bark, RMA, am acting as Location manager for [employer], DO.  I have reviewed the above documentation for accuracy and completeness, and I agree with the above. Jearld Lesch, DO

## 2021-12-04 NOTE — Progress Notes (Signed)
12/04/2021 Vickie Lane August 13, 1961 [id number]   HPI:  Vickie Lane is a 61 y.o. female patient of Dr Oval Linsey, with a [place] below who presents today for advanced hypertension clinic follow up.  Patient was seen by Dr. Oval Linsey in November, at which time her blood pressure was noted to be 152/92.  She noted having been hypertensive since her first pregnancy at age 73.  That as well as subsequent pregnancy were both delivered early.  More recently she has been working with [facility] and exercising regularly.    Today she returns for follow up.  In her Vivify readings, she has had several over the past month that were < 585 systolic.  She notes that when this happens she feels much more tired/washed out than normal.  Would like to keep systolic > 277 if possible.   She is now working with [facility] for weight loss - they have started her on Mounjaro 2.5 mg, although she has not yet received insurance approval.   Recent renin aldosterone labs showed normal values with an increased ratio.  Further evaluation by CT showed adrenal glands to be unremarkable.    Past Medical History: hyperlipidemia 8/22 LDL 80 - on simvastatin 40  DM2 No A1c on file, controlled with diet/exercise  SLE Has joint pain     Blood Pressure Goal:  130/80  Current Medications: chlorthalidone 25 mg qd (am), clonidine 0.2 mg bid, hydralazine 25 mg bid, spironolactone 25 mg qd (am) valsartan 320 mg qd (pm)  Family Hx: father died at 27 form massive MI; mother now 67, has hypertension with age onset; one brother DM  Social Hx: no tobacco, occasional alcohol, no caffeine  Diet: pescatarian - plenty of fruits/vegetablet; staples fresh veggies; using rice cauliflower  Exercise: 30 min 3x/week; strength trainig at home, working with 3 cycle clubs - has tried to make her bike stationary in winger months  Home BP readings: in [place]  14 day average  119/82  30 day average 122/84  Intolerances: amlodipine - edema  Labs: 12/22:  Na 143, K 4.0, Glu 98, BUN 15, SCr 1.03, GFR 63   Wt Readings from Last 3 Encounters:  12/04/21 193 lb 3.2 oz (87.6 kg)  11/30/21 187 lb (84.8 kg)  11/06/21 186 lb (84.4 kg)   BP Readings from Last 3 Encounters:  12/04/21 119/80  11/30/21 124/81  11/06/21 113/77   Pulse Readings from Last 3 Encounters:  12/04/21 65  11/30/21 68  11/06/21 64    Current Outpatient Medications  Medication Sig Dispense Refill   aspirin EC 81 MG tablet Take 81 mg by mouth every morning.     b complex vitamins capsule Take 1 capsule by mouth daily.     Cetirizine-Pseudoephedrine (ZYRTEC-D PO) Take 1 tablet by mouth daily as needed. For allergies     chlorthalidone (HYGROTON) 25 MG tablet Take 1 tablet (25 mg total) by mouth daily. 30 tablet 5   Cholecalciferol (VITAMIN D3 PO) Take 125 mcg by mouth daily.     cloNIDine (CATAPRES) 0.2 MG tablet Take 1 tablet by mouth 2 times a day 180 tablet 3   COVID-19 mRNA bivalent vaccine, Pfizer, (PFIZER COVID-19 VAC BIVALENT) injection Inject into the muscle. 0.3 mL 0   esomeprazole (NEXIUM) 20 MG packet Take 20 mg by mouth as needed.     estradiol (CLIMARA) 0.1 mg/24hr patch Place 1 patch (0.1 mg total) onto the skin once a week.  4 patch 12   FREESTYLE LITE test strip   11   hydrALAZINE (APRESOLINE) 25 MG tablet Take 1 tablet (25 mg total) by mouth 2 (two) times daily at 8 am and 10 pm. 180 tablet 3   Lancets (FREESTYLE) lancets   12   meloxicam (MOBIC) 7.5 MG tablet Take 7.5 mg by mouth daily as needed for pain.     Multiple Vitamin (MULTIVITAMIN WITH MINERALS) TABS Take 1 tablet by mouth daily.     simvastatin (ZOCOR) 40 MG tablet Take 1 tablet (40 mg total) by mouth at bedtime. 90 tablet 1   spironolactone (ALDACTONE) 25 MG tablet Take 1 tablet by mouth once a day 90 tablet 3   valACYclovir (VALTREX) 500 MG tablet Take 1 tablet (500 mg total) by mouth daily. 30 tablet 11    valsartan (DIOVAN) 320 MG tablet Take 1 tablet (320 mg total) by mouth daily. 90 tablet 1   vitamin B-12 (CYANOCOBALAMIN) 1000 MCG tablet Take 5,000 mcg by mouth daily.     tirzepatide [facility]) 2.5 MG/0.5ML Pen Inject 2.5 mg into the skin once a week. (Patient not taking: Reported on 12/04/2021) 2 mL 0   No current facility-administered medications for this visit.    Allergies  Allergen Reactions   Ciprofloxacin Anaphylaxis   Codeine Nausea Only, Other (See Comments), Anaphylaxis and Nausea And Vomiting    "dry heaves" (11/07/2012) Other reaction(s): stomach upset   Hydrocodone Nausea Only and Other (See Comments)    "dry heaves" (11/07/2012)   Levofloxacin Anaphylaxis and Rash    Other reaction(s): anaphylaxis   Ultram [Tramadol Hcl] Nausea Only and Other (See Comments)    "dry heaves" (11/07/2012)   Amlodipine Swelling   Amlodipine Besylate     Other reaction(s): anaphylaxis   Hydrocodone-Acetaminophen     Other reaction(s): stomach upset    Past Medical History:  Diagnosis Date   Allergy    Arthritis    "back" (11/07/2012)   Chest pain    Chronic lower back pain    Complication of anesthesia    somthing made her itch   Constipation    GERD (gastroesophageal reflux disease)    Heart murmur    "MVP" (`11/07/2012)   History of blood transfusion 1990's   "? w/one of my deliveries" (11/07/2012)   Hyperlipidemia    LDL   Hypertension 04/03/2010   Joint pain    Kidney problem    Lactose intolerance    Mitral valve prolapse    Seizures ([place]) 1991   "related to pregnancy; I had HELLP" (11/07/2012)- lastseizure was 1991 per pt    Sickle cell trait ([place])    Systemic lupus erythematosus ([place]) 04/03/2010   joint pain   Type II diabetes mellitus ([place]) 08/2011   Diet and exercise.    Blood pressure 119/80, pulse 65, resp. rate 17, height 5\' 6"  (1.676 m), weight 193 lb 3.2 oz (87.6 kg), SpO2 100 %.  Hypertension associated with diabetes ([place]) Patient with elevated  aldosterone/renin ratio, currently BP controlled on multiple medications.  She seems to have symptomatic hypotension with readings < 774 systolic and also notes dry mouth with clonidine use.  Will have her cut the evening clonidine to 0.1 mg (1/2 tablet) for 1 week, then if BP stable, she will cut to 0.1 mg bid.  Hope to keep BP in range with fewer low readings.  She will continue with regular home Vivify monitoring and will follow up with Dr. Oval Linsey in 2 months.  Vickie Lane PharmD CPP [facility] [address] [place], [place] [address] [phone]

## 2021-12-06 ENCOUNTER — Other Ambulatory Visit (HOSPITAL_COMMUNITY): Payer: Self-pay

## 2021-12-07 ENCOUNTER — Other Ambulatory Visit (HOSPITAL_COMMUNITY): Payer: Self-pay

## 2021-12-11 ENCOUNTER — Other Ambulatory Visit (HOSPITAL_COMMUNITY): Payer: Self-pay

## 2021-12-13 ENCOUNTER — Encounter (INDEPENDENT_AMBULATORY_CARE_PROVIDER_SITE_OTHER): Payer: Self-pay | Admitting: Bariatrics

## 2021-12-13 ENCOUNTER — Other Ambulatory Visit (HOSPITAL_COMMUNITY): Payer: Self-pay

## 2021-12-19 NOTE — Progress Notes (Signed)
Labs are stable.

## 2021-12-21 ENCOUNTER — Other Ambulatory Visit (HOSPITAL_COMMUNITY): Payer: Self-pay

## 2021-12-21 ENCOUNTER — Other Ambulatory Visit (INDEPENDENT_AMBULATORY_CARE_PROVIDER_SITE_OTHER): Payer: Self-pay | Admitting: Bariatrics

## 2021-12-21 DIAGNOSIS — E1169 Type 2 diabetes mellitus with other specified complication: Secondary | ICD-10-CM

## 2021-12-21 NOTE — Telephone Encounter (Signed)
Please review

## 2021-12-22 ENCOUNTER — Other Ambulatory Visit (HOSPITAL_COMMUNITY): Payer: Self-pay

## 2021-12-26 ENCOUNTER — Other Ambulatory Visit: Payer: Self-pay

## 2021-12-26 ENCOUNTER — Ambulatory Visit (INDEPENDENT_AMBULATORY_CARE_PROVIDER_SITE_OTHER): Payer: No Typology Code available for payment source | Admitting: Bariatrics

## 2021-12-26 ENCOUNTER — Other Ambulatory Visit (HOSPITAL_COMMUNITY): Payer: Self-pay

## 2021-12-26 ENCOUNTER — Encounter (INDEPENDENT_AMBULATORY_CARE_PROVIDER_SITE_OTHER): Payer: Self-pay | Admitting: Bariatrics

## 2021-12-26 VITALS — BP 125/85 | HR 68 | Temp 98.1°F | Ht 66.0 in | Wt 190.0 lb

## 2021-12-26 DIAGNOSIS — E1169 Type 2 diabetes mellitus with other specified complication: Secondary | ICD-10-CM | POA: Diagnosis not present

## 2021-12-26 DIAGNOSIS — Z6830 Body mass index (BMI) 30.0-30.9, adult: Secondary | ICD-10-CM | POA: Diagnosis not present

## 2021-12-26 DIAGNOSIS — E7849 Other hyperlipidemia: Secondary | ICD-10-CM

## 2021-12-26 DIAGNOSIS — E669 Obesity, unspecified: Secondary | ICD-10-CM | POA: Diagnosis not present

## 2021-12-27 ENCOUNTER — Other Ambulatory Visit (INDEPENDENT_AMBULATORY_CARE_PROVIDER_SITE_OTHER): Payer: Self-pay | Admitting: Bariatrics

## 2021-12-27 ENCOUNTER — Other Ambulatory Visit: Payer: Self-pay

## 2021-12-27 ENCOUNTER — Other Ambulatory Visit (HOSPITAL_COMMUNITY): Payer: Self-pay

## 2021-12-27 DIAGNOSIS — Z6831 Body mass index (BMI) 31.0-31.9, adult: Secondary | ICD-10-CM

## 2021-12-27 DIAGNOSIS — E6609 Other obesity due to excess calories: Secondary | ICD-10-CM

## 2021-12-27 DIAGNOSIS — E785 Hyperlipidemia, unspecified: Secondary | ICD-10-CM

## 2021-12-27 DIAGNOSIS — E1169 Type 2 diabetes mellitus with other specified complication: Secondary | ICD-10-CM

## 2021-12-27 MED ORDER — WEGOVY 0.25 MG/0.5ML ~~LOC~~ SOAJ
0.2500 mg | SUBCUTANEOUS | 0 refills | Status: DC
  Filled 2021-12-27: qty 2, 30d supply, fill #0

## 2021-12-27 NOTE — Progress Notes (Signed)
Chief Complaint:   OBESITY Vickie Lane is here to discuss her progress with her obesity treatment plan along with follow-up of her obesity related diagnoses. Vickie Lane is on the [employer] and states she is following her eating plan approximately 50% of the time. Vickie Lane states she is doing 0 minutes 0 times per week.  Today's visit was #: 6 Starting weight: 193 lbs Starting date: 09/06/2021 Today's weight: 190 lbs Today's date: 12/26/2021 Total lbs lost to date: 3 lbs Total lbs lost since last in-office visit: 0  Interim History: Vickie Lane is up 4 lbs since her last visit. We are waiting on the pre-authorization for [facility]. She was on vacation and increased protein.  Subjective:   1. Diabetes mellitus type 2 in obese [facility]) Vickie Lane is waiting on pre-authorization for [facility]. A coupon was given today. Her diabetes mellitus type 2 is worsening she has no medication for diabetes.  2. Other hyperlipidemia Vickie Lane is taking Zocor currently.  Assessment/Plan:   1. Diabetes mellitus type 2 in obese [facility]) Vickie Lane will begin [facility] if approved, if not she will consider other alternatives. Good blood sugar control is important to decrease the likelihood of diabetic complications such as nephropathy, neuropathy, limb loss, blindness, coronary artery disease, and death. Intensive lifestyle modification including diet, exercise and weight loss are the first line of treatment for diabetes.   2. Other hyperlipidemia Cardiovascular risk and specific lipid/LDL goals reviewed.  Vickie Lane will continue taking Zocor. We discussed several lifestyle modifications today and Vickie Lane will continue to work on diet, exercise and weight loss efforts. Orders and follow up as documented in patient record.   Counseling Intensive lifestyle modifications are the first line treatment for this issue. Dietary changes: Increase soluble fiber. Decrease simple carbohydrates. Exercise changes: Moderate to  vigorous-intensity aerobic activity 150 minutes per week if tolerated. Lipid-lowering medications: see documented in medical record.  3. Obesity, current BMI 30.8 Vickie Lane is currently in the action stage of change. As such, her goal is to continue with weight loss efforts. She has agreed to the [employer].   Rx written for Wegovy 0.25 mg into the skin weekly ( one month supply and 0 refills ).   Vickie Lane will eliminate all sugars, carbohydrates and she will increased water and dairy.   Exercise goals: No exercise has been prescribed at this time.  Behavioral modification strategies: increasing lean protein intake, decreasing simple carbohydrates, increasing vegetables, increasing water intake, decreasing eating out, no skipping meals, meal planning and cooking strategies, keeping healthy foods in the home, and planning for success.  Vickie Lane has agreed to follow-up with our clinic in 2 weeks. She was informed of the importance of frequent follow-up visits to maximize her success with intensive lifestyle modifications for her multiple health conditions.   Objective:   Blood pressure 125/85, pulse 68, temperature 98.1 F (36.7 C), height 5\' 6"  (1.676 m), weight 190 lb (86.2 kg), SpO2 100 %. Body mass index is 30.67 kg/m.  General: Cooperative, alert, well developed, in no acute distress. HEENT: Conjunctivae and lids unremarkable. Cardiovascular: Regular rhythm.  Lungs: Normal work of breathing. Neurologic: No focal deficits.   Lab Results  Component Value Date   CREATININE 1.03 (H) 10/27/2021   BUN 15 10/27/2021   NA 143 10/27/2021   K 4.0 10/27/2021   CL 105 10/27/2021   CO2 25 10/27/2021   Lab Results  Component Value Date   ALT 16 03/29/2016   AST 19 03/29/2016   ALKPHOS 71 03/29/2016  BILITOT 0.3 03/29/2016   Lab Results  Component Value Date   HGBA1C 5.7 (H) 03/29/2016   Lab Results  Component Value Date   INSULIN 5.9 09/06/2021   Lab Results  Component  Value Date   TSH 1.820 08/08/2021   Lab Results  Component Value Date   CHOL 170 03/29/2016   HDL 83 03/29/2016   LDLCALC 79 03/29/2016   TRIG 41 03/29/2016   CHOLHDL 2.0 03/29/2016   Lab Results  Component Value Date   VD25OH 71.7 09/06/2021   Lab Results  Component Value Date   WBC 4.3 03/29/2016   HGB 13.6 03/29/2016   HCT 40.5 03/29/2016   MCV 83.0 03/29/2016   PLT 272 03/29/2016   No results found for: IRON, TIBC, FERRITIN  Attestation Statements:   Reviewed by clinician on day of visit: allergies, medications, problem list, medical history, surgical history, family history, social history, and previous encounter notes.  I, Lizbeth Bark, RMA, am acting as Location manager for [employer], DO.  I have reviewed the above documentation for accuracy and completeness, and I agree with the above. Jearld Lesch, DO

## 2021-12-27 NOTE — Telephone Encounter (Signed)
Please advie

## 2021-12-27 NOTE — Telephone Encounter (Signed)
Dr.Brown 

## 2021-12-28 ENCOUNTER — Other Ambulatory Visit (HOSPITAL_COMMUNITY): Payer: Self-pay

## 2021-12-31 ENCOUNTER — Encounter (INDEPENDENT_AMBULATORY_CARE_PROVIDER_SITE_OTHER): Payer: Self-pay | Admitting: Bariatrics

## 2022-01-01 DIAGNOSIS — I10 Essential (primary) hypertension: Secondary | ICD-10-CM | POA: Diagnosis not present

## 2022-01-04 ENCOUNTER — Other Ambulatory Visit (HOSPITAL_COMMUNITY): Payer: Self-pay

## 2022-01-05 ENCOUNTER — Other Ambulatory Visit (HOSPITAL_COMMUNITY): Payer: Self-pay

## 2022-01-09 ENCOUNTER — Encounter (INDEPENDENT_AMBULATORY_CARE_PROVIDER_SITE_OTHER): Payer: Self-pay | Admitting: Bariatrics

## 2022-01-09 ENCOUNTER — Ambulatory Visit (INDEPENDENT_AMBULATORY_CARE_PROVIDER_SITE_OTHER): Payer: No Typology Code available for payment source | Admitting: Bariatrics

## 2022-01-09 ENCOUNTER — Other Ambulatory Visit: Payer: Self-pay

## 2022-01-09 VITALS — BP 108/74 | HR 85 | Temp 98.3°F | Ht 66.0 in | Wt 187.0 lb

## 2022-01-09 DIAGNOSIS — E1169 Type 2 diabetes mellitus with other specified complication: Secondary | ICD-10-CM | POA: Diagnosis not present

## 2022-01-09 DIAGNOSIS — E669 Obesity, unspecified: Secondary | ICD-10-CM

## 2022-01-09 DIAGNOSIS — Z7985 Long-term (current) use of injectable non-insulin antidiabetic drugs: Secondary | ICD-10-CM

## 2022-01-09 DIAGNOSIS — Z6830 Body mass index (BMI) 30.0-30.9, adult: Secondary | ICD-10-CM

## 2022-01-09 DIAGNOSIS — E7849 Other hyperlipidemia: Secondary | ICD-10-CM

## 2022-01-09 NOTE — Progress Notes (Signed)
Chief Complaint:   OBESITY Vickie Lane is here to discuss her progress with her obesity treatment plan along with follow-up of her obesity related diagnoses. Vickie Lane is on the [employer] and states she is following her eating plan approximately 75% of the time. Vickie Lane states she is walking 2 miles 2-3 times per week, and doing strength training for 30-60 minutes 3 times per week.  Today's visit was #: 7 Starting weight: 193 lbs Starting date: 09/06/2021 Today's weight: 187 lbs Today's date: 01/09/2022 Total lbs lost to date: 6 Total lbs lost since last in-office visit: 3  Interim History: Vickie Lane is down 3 lbs and she is doing well overall. She is getting adequate protein.   Subjective:   1. Type 2 diabetes mellitus with other specified complication, without long-term current use of insulin (HCC) Vickie Lane is taking the [facility]. She notes decreased appetite, with slight nausea.  2. Other hyperlipidemia Vickie Lane is taking Simvastatin.  Assessment/Plan:   1. Type 2 diabetes mellitus with other specified complication, without long-term current use of insulin (HCC) Vickie Lane will continue the [place]. Good blood sugar control is important to decrease the likelihood of diabetic complications such as nephropathy, neuropathy, limb loss, blindness, coronary artery disease, and death. Intensive lifestyle modification including diet, exercise and weight loss are the first line of treatment for diabetes.   2. Other hyperlipidemia Cardiovascular risk and specific lipid/LDL goals reviewed. We discussed several lifestyle modifications today. Vickie Lane will continue to work on diet, exercise and weight loss efforts. She is to eliminate trans fats and limit saturated fats. Orders and follow up as documented in patient record.   Counseling Intensive lifestyle modifications are the first line treatment for this issue. Dietary changes: Increase soluble fiber. Decrease simple  carbohydrates. Exercise changes: Moderate to vigorous-intensity aerobic activity 150 minutes per week if tolerated. Lipid-lowering medications: see documented in medical record.  3. Obesity, current BMI 30.2 Vickie Lane is currently in the action stage of change. As such, her goal is to continue with weight loss efforts. She has agreed to the [employer].   Meal planning. Intentional eating. Increase water. Consider water app.  Exercise goals: As is.  Behavioral modification strategies: increasing lean protein intake, decreasing simple carbohydrates, increasing vegetables, increasing water intake, decreasing eating out, no skipping meals, meal planning and cooking strategies, keeping healthy foods in the home, dealing with family or coworker sabotage, travel eating strategies, holiday eating strategies , and celebration eating strategies.  Vickie Lane has agreed to follow-up with our clinic in 2 weeks. She was informed of the importance of frequent follow-up visits to maximize her success with intensive lifestyle modifications for her multiple health conditions.   Objective:   Blood pressure 108/74, pulse 85, temperature 98.3 F (36.8 C), height 5\' 6"  (1.676 m), weight 187 lb (84.8 kg), SpO2 99 %. Body mass index is 30.18 kg/m.  General: Cooperative, alert, well developed, in no acute distress. HEENT: Conjunctivae and lids unremarkable. Cardiovascular: Regular rhythm.  Lungs: Normal work of breathing. Neurologic: No focal deficits.   Lab Results  Component Value Date   CREATININE 1.03 (H) 10/27/2021   BUN 15 10/27/2021   NA 143 10/27/2021   K 4.0 10/27/2021   CL 105 10/27/2021   CO2 25 10/27/2021   Lab Results  Component Value Date   ALT 16 03/29/2016   AST 19 03/29/2016   ALKPHOS 71 03/29/2016   BILITOT 0.3 03/29/2016   Lab Results  Component Value Date   HGBA1C 5.7 (H) 03/29/2016  Lab Results  Component Value Date   INSULIN 5.9 09/06/2021   Lab Results  Component  Value Date   TSH 1.820 08/08/2021   Lab Results  Component Value Date   CHOL 170 03/29/2016   HDL 83 03/29/2016   LDLCALC 79 03/29/2016   TRIG 41 03/29/2016   CHOLHDL 2.0 03/29/2016   Lab Results  Component Value Date   VD25OH 71.7 09/06/2021   Lab Results  Component Value Date   WBC 4.3 03/29/2016   HGB 13.6 03/29/2016   HCT 40.5 03/29/2016   MCV 83.0 03/29/2016   PLT 272 03/29/2016   No results found for: IRON, TIBC, FERRITIN  Attestation Statements:   Reviewed by clinician on day of visit: allergies, medications, problem list, medical history, surgical history, family history, social history, and previous encounter notes.   Wilhemena Durie, am acting as Location manager for [employer], DO.  I have reviewed the above documentation for accuracy and completeness, and I agree with the above. Jearld Lesch, DO

## 2022-01-10 ENCOUNTER — Encounter (INDEPENDENT_AMBULATORY_CARE_PROVIDER_SITE_OTHER): Payer: Self-pay

## 2022-01-10 ENCOUNTER — Encounter (INDEPENDENT_AMBULATORY_CARE_PROVIDER_SITE_OTHER): Payer: Self-pay | Admitting: Bariatrics

## 2022-01-10 ENCOUNTER — Other Ambulatory Visit (HOSPITAL_COMMUNITY): Payer: Self-pay

## 2022-01-17 ENCOUNTER — Other Ambulatory Visit (HOSPITAL_COMMUNITY): Payer: Self-pay

## 2022-01-18 ENCOUNTER — Other Ambulatory Visit (HOSPITAL_COMMUNITY): Payer: Self-pay

## 2022-01-22 ENCOUNTER — Other Ambulatory Visit (HOSPITAL_COMMUNITY): Payer: Self-pay

## 2022-01-22 ENCOUNTER — Other Ambulatory Visit (INDEPENDENT_AMBULATORY_CARE_PROVIDER_SITE_OTHER): Payer: Self-pay | Admitting: Bariatrics

## 2022-01-22 ENCOUNTER — Encounter (INDEPENDENT_AMBULATORY_CARE_PROVIDER_SITE_OTHER): Payer: Self-pay | Admitting: Bariatrics

## 2022-01-22 MED ORDER — TIRZEPATIDE 5 MG/0.5ML ~~LOC~~ SOAJ
5.0000 mg | SUBCUTANEOUS | 0 refills | Status: DC
  Filled 2022-01-22: qty 2, 28d supply, fill #0

## 2022-01-22 NOTE — Telephone Encounter (Signed)
LAST APPOINTMENT DATE: 01/09/22 ?NEXT APPOINTMENT DATE: 01/31/22 ? ? ?[facility] ?[address] ?[place] [address] ?Phone: [phone] Fax: [phone] ? ?Patient is requesting a refill of the following medications: ?No prescriptions requested or ordered in this encounter ? ? ?Date last filled: 11/30/21 ?Previously prescribed by Dr. Owens Shark ? ?Lab Results ?     Component                Value               Date                 ?     HGBA1C                   5.7 (H)             03/29/2016           ?Lab Results ?     Component                Value               Date                 ?     [place]                  79                  03/29/2016           ?     CREATININE               1.03 (H)            10/27/2021           ?Lab Results ?     Component                Value               Date                 ?     VD25OH                   71.7                09/06/2021           ? ?BP Readings from Last 3 Encounters: ?01/09/22 : 108/74 ?12/26/21 : 125/85 ?12/04/21 : 119/80 ? ?

## 2022-01-22 NOTE — Telephone Encounter (Signed)
Dr.Brown 

## 2022-01-31 ENCOUNTER — Ambulatory Visit (INDEPENDENT_AMBULATORY_CARE_PROVIDER_SITE_OTHER): Payer: No Typology Code available for payment source | Admitting: Nurse Practitioner

## 2022-01-31 ENCOUNTER — Encounter (INDEPENDENT_AMBULATORY_CARE_PROVIDER_SITE_OTHER): Payer: Self-pay | Admitting: Nurse Practitioner

## 2022-01-31 ENCOUNTER — Other Ambulatory Visit: Payer: Self-pay

## 2022-01-31 VITALS — BP 135/84 | HR 73 | Temp 97.6°F | Ht 66.0 in | Wt 183.0 lb

## 2022-01-31 DIAGNOSIS — E1169 Type 2 diabetes mellitus with other specified complication: Secondary | ICD-10-CM

## 2022-01-31 DIAGNOSIS — E1159 Type 2 diabetes mellitus with other circulatory complications: Secondary | ICD-10-CM

## 2022-01-31 DIAGNOSIS — E785 Hyperlipidemia, unspecified: Secondary | ICD-10-CM

## 2022-01-31 DIAGNOSIS — Z7985 Long-term (current) use of injectable non-insulin antidiabetic drugs: Secondary | ICD-10-CM

## 2022-01-31 DIAGNOSIS — I152 Hypertension secondary to endocrine disorders: Secondary | ICD-10-CM | POA: Diagnosis not present

## 2022-01-31 DIAGNOSIS — D573 Sickle-cell trait: Secondary | ICD-10-CM | POA: Insufficient documentation

## 2022-01-31 DIAGNOSIS — Z6829 Body mass index (BMI) 29.0-29.9, adult: Secondary | ICD-10-CM

## 2022-01-31 DIAGNOSIS — E669 Obesity, unspecified: Secondary | ICD-10-CM

## 2022-01-31 DIAGNOSIS — I10 Essential (primary) hypertension: Secondary | ICD-10-CM | POA: Diagnosis not present

## 2022-02-01 ENCOUNTER — Ambulatory Visit (HOSPITAL_BASED_OUTPATIENT_CLINIC_OR_DEPARTMENT_OTHER): Payer: No Typology Code available for payment source | Admitting: Cardiovascular Disease

## 2022-02-01 NOTE — Progress Notes (Signed)
? ? ? ?Chief Complaint:  ? ?OBESITY ?Tayia is here to discuss her progress with her obesity treatment plan along with follow-up of her obesity related diagnoses. Kinlee is on the [employer] and states she is following her eating plan approximately 75% of the time. Aleira states she is walking and strength training for 60 minutes 3 times per week. ? ?Today's visit was #: 8 ?Starting weight: 193 lbs ?Starting date: 09/06/2021 ?Today's weight: 183 lbs ?Today's date: 01/31/2022 ?Total lbs lost to date: 10 lbs ?Total lbs lost since last in-office visit: 4 lbs ? ?Interim History: Sherion is doing well with weight loss. She is struggling to eat enough protein. She has to remind herself to eat since starting Mounjaro 5 mg 1 week ago. She denies hunger and cravings.  ? ?Subjective:  ? ?1. Type 2 diabetes mellitus with other specified complication, without long-term current use of insulin ([place]) ?Sarabi is currently taking Mounjaro 5 mg. Her fructosamine was 269 on 01/03/2022. She reports side effects of nausea and reflux. She denies constipation.  ? ?2. Hyperlipidemia associated with type 2 diabetes mellitus ([place]) ?Randalyn is taking Zocor 40 mg. She denies side effects. Her last Lipids were 07/04/2021, triglycerides 66, total cholesterol 178, LDL 80. CMP 07/03/2021. We reviewed labs via her portal   ? ?3. Hypertension associated with type 2 diabetes mellitus ([place]) ?Carloyn is currently in a hypertension study. She is checking her blood pressure twice per day. Her averaging blood pressure is 117/81. She has seen cardiology.  ? ?Assessment/Plan:  ? ?1. Type 2 diabetes mellitus with other specified complication, without long-term current use of insulin ([place]) ?Mollie will continue Mounjaro 5 mg. We discussed side effects. She will continue working on dietary changes, exercise and weight loss. Good blood sugar control is important to decrease the likelihood of diabetic complications such as nephropathy, neuropathy, limb  loss, blindness, coronary artery disease, and death. Intensive lifestyle modification including diet, exercise and weight loss are the first line of treatment for diabetes.  ? ?2. Hyperlipidemia associated with type 2 diabetes mellitus ([place]) ?Cardiovascular risk and specific lipid/LDL goals reviewed.  Shaquasia will continue to follow up with cardiology and her primary care physician. She will continue medications as directed. We discussed several lifestyle modifications today and Tauri will continue to work on diet, exercise and weight loss efforts. Orders and follow up as documented in patient record.  ? ?Counseling ?Intensive lifestyle modifications are the first line treatment for this issue. ?Dietary changes: Increase soluble fiber. Decrease simple carbohydrates. ?Exercise changes: Moderate to vigorous-intensity aerobic activity 150 minutes per week if tolerated. ?Lipid-lowering medications: see documented in medical record. ? ?3. Hypertension associated with type 2 diabetes mellitus ([place]) ?Tiwana will continue to follow up with cardiology. She will continue medications as directed. She is working on healthy weight loss and exercise to improve blood pressure control. We will watch for signs of hypotension as she continues her lifestyle modifications. ? ?4. Obesity with current BMI 29.6 ?Willine is currently in the action stage of change. As such, her goal is to continue with weight loss efforts. She has agreed to the [employer].  ? ?Keshanna was provided protein content of foods handout today. ? ?Exercise goals:  As is. ? ?Behavioral modification strategies: increasing lean protein intake, increasing water intake, no skipping meals, and meal planning and cooking strategies. ? ?Mannat has agreed to follow-up with our clinic in 3 weeks. She was informed of the importance of frequent follow-up visits to maximize her success  with intensive lifestyle modifications for her multiple health conditions.   ? ?Objective:  ? ?Blood pressure 135/84, pulse 73, temperature 97.6 ?F (36.4 ?C), height '5\' 6"'$  (1.676 m), weight 183 lb (83 kg), SpO2 100 %. ?Body mass index is 29.54 kg/m?. ? ?General: Cooperative, alert, well developed, in no acute distress. ?HEENT: Conjunctivae and lids unremarkable. ?Cardiovascular: Regular rhythm.  ?Lungs: Normal work of breathing. ?Neurologic: No focal deficits.  ? ?Lab Results  ?Component Value Date  ? CREATININE 1.03 (H) 10/27/2021  ? BUN 15 10/27/2021  ? NA 143 10/27/2021  ? K 4.0 10/27/2021  ? CL 105 10/27/2021  ? CO2 25 10/27/2021  ? ?Lab Results  ?Component Value Date  ? ALT 16 03/29/2016  ? AST 19 03/29/2016  ? ALKPHOS 71 03/29/2016  ? BILITOT 0.3 03/29/2016  ? ?Lab Results  ?Component Value Date  ? HGBA1C 5.7 (H) 03/29/2016  ? ?Lab Results  ?Component Value Date  ? INSULIN 5.9 09/06/2021  ? ?Lab Results  ?Component Value Date  ? TSH 1.820 08/08/2021  ? ?Lab Results  ?Component Value Date  ? CHOL 170 03/29/2016  ? HDL 83 03/29/2016  ? [place] 79 03/29/2016  ? TRIG 41 03/29/2016  ? CHOLHDL 2.0 03/29/2016  ? ?Lab Results  ?Component Value Date  ? VD25OH 71.7 09/06/2021  ? ?Lab Results  ?Component Value Date  ? WBC 4.3 03/29/2016  ? HGB 13.6 03/29/2016  ? HCT 40.5 03/29/2016  ? MCV 83.0 03/29/2016  ? PLT 272 03/29/2016  ? ?No results found for: IRON, TIBC, FERRITIN ? ?Attestation Statements:  ? ?Reviewed by clinician on day of visit: allergies, medications, problem list, medical history, surgical history, family history, social history, and previous encounter notes. ? ?Time spent on visit including pre-visit chart review and post-visit care and charting was 30 minutes.  ? ?I, Lizbeth Bark, RMA, am acting as transcriptionist for Everardo Pacific, FNP ? ?I have reviewed the above documentation for accuracy and completeness, and I agree with the above. Everardo Pacific, FNP  ?

## 2022-02-07 ENCOUNTER — Other Ambulatory Visit: Payer: Self-pay

## 2022-02-07 ENCOUNTER — Encounter (HOSPITAL_BASED_OUTPATIENT_CLINIC_OR_DEPARTMENT_OTHER): Payer: Self-pay | Admitting: Cardiovascular Disease

## 2022-02-07 ENCOUNTER — Other Ambulatory Visit (HOSPITAL_COMMUNITY): Payer: Self-pay

## 2022-02-07 ENCOUNTER — Ambulatory Visit (INDEPENDENT_AMBULATORY_CARE_PROVIDER_SITE_OTHER): Payer: No Typology Code available for payment source | Admitting: Cardiovascular Disease

## 2022-02-07 DIAGNOSIS — I25118 Atherosclerotic heart disease of native coronary artery with other forms of angina pectoris: Secondary | ICD-10-CM | POA: Diagnosis not present

## 2022-02-07 DIAGNOSIS — Z6831 Body mass index (BMI) 31.0-31.9, adult: Secondary | ICD-10-CM

## 2022-02-07 DIAGNOSIS — E1159 Type 2 diabetes mellitus with other circulatory complications: Secondary | ICD-10-CM

## 2022-02-07 DIAGNOSIS — I10 Essential (primary) hypertension: Secondary | ICD-10-CM

## 2022-02-07 DIAGNOSIS — I152 Hypertension secondary to endocrine disorders: Secondary | ICD-10-CM

## 2022-02-07 DIAGNOSIS — E6609 Other obesity due to excess calories: Secondary | ICD-10-CM

## 2022-02-07 DIAGNOSIS — E782 Mixed hyperlipidemia: Secondary | ICD-10-CM

## 2022-02-07 MED ORDER — CARVEDILOL 12.5 MG PO TABS
12.5000 mg | ORAL_TABLET | Freq: Two times a day (BID) | ORAL | 3 refills | Status: DC
Start: 2022-02-07 — End: 2022-02-15
  Filled 2022-02-07: qty 180, 90d supply, fill #0

## 2022-02-07 MED ORDER — CHLORTHALIDONE 25 MG PO TABS
25.0000 mg | ORAL_TABLET | Freq: Every day | ORAL | 3 refills | Status: DC
  Filled 2022-02-07: qty 90, 90d supply, fill #0
  Filled 2022-05-14: qty 90, 90d supply, fill #1
  Filled 2022-08-10: qty 90, 90d supply, fill #2
  Filled 2022-11-07: qty 90, 90d supply, fill #3

## 2022-02-07 MED ORDER — VALSARTAN 320 MG PO TABS
320.0000 mg | ORAL_TABLET | Freq: Every day | ORAL | 3 refills | Status: DC
  Filled 2022-02-07 – 2022-04-16 (×2): qty 90, 90d supply, fill #0
  Filled 2022-07-10: qty 90, 90d supply, fill #1
  Filled 2022-07-18: qty 90, 90d supply, fill #2

## 2022-02-07 MED ORDER — SIMVASTATIN 40 MG PO TABS
40.0000 mg | ORAL_TABLET | Freq: Every day | ORAL | 3 refills | Status: DC
  Filled 2022-02-07 – 2022-03-21 (×2): qty 90, 90d supply, fill #0
  Filled 2022-06-16: qty 90, 90d supply, fill #1
  Filled 2022-09-13: qty 90, 90d supply, fill #2

## 2022-02-07 NOTE — Assessment & Plan Note (Signed)
Blood pressure has been much better controlled.  Renin/aldo was indeterminate but there were no adenomas or RAS on CT-A abdomen.  Overall her blood pressure is better controlled and she is doing well.  However she has noted some tachycardia.  She has not been symptomatic but noted it on her monitor.  She has a watch that can detect heart rhythms and it is only shown sinus tachycardia with no evidence of atrial fibrillation.  She has no heart failure symptoms and had no heart failure on echo 07/2021.  We will switch her hydralazine to carvedilol 12.5 mg twice daily.  She will keep checking her blood pressures in vivify.  If they remain controlled at her follow-up then she can be unenrolled.  Continue chlorthalidone, losartan, and spironolactone.  She very rarely uses pseudoephedrine.  Continue working on diet and exercise. ?

## 2022-02-07 NOTE — Progress Notes (Signed)
? ?Advanced Hypertension Clinic Follow-up:   ? ?Date:  03/06/2022  ? ?ID:  Vickie Lane, DOB 03/31/1961, MRN [id number] ? ?PCP:  Kelton Pillar, MD  ?Cardiologist:  Jenkins Rouge, MD  ?Nephrologist: ? ?Referring MD: Kelton Pillar, MD  ? ?CC: Hypertension ? ?History of Present Illness:   ? ?Vickie Lane is a 61 y.o. female with a hx of hypertension, hyperlipidemia, non-obstructive CAD, CKD stage 2, mitral valve prolapse, sickle cell trait, diabetes, and GERD here for follow-up. She was initially seen 10/03/2021 to establish care in the Advanced Hypertension Clinic.   She last saw Laurann Montana, NP on 08/08/21 for hypertension. Her blood pressure was 186/120. She reported having high blood pressure since she was 61 years old which was worsened with pregnancy and HELLP syndrome. Benazepril was switched to Benicar and Amlodipine was discontinued due to LE edema. At that visit, Olmesartan was discontinued and '320mg'$  Valsartan daily was started. Clonidine 0.'2mg'$  twice daily was continued. Her blood pressure has been difficult to control over the years so she was referred to the hypertension clinic. She underwent cardiac CTA in 2017 with a calcium score of 34 placing her in 90th percentile for age and sex matched control. She had less than 50% disease which was non-obstructive in proximal LAD and D2 with <30% calcific disease in proximal and mid RCA. Echo from 08/2018 revealed normal function with LVEF 55-60%. Echo from 07/2021 showed no significant changes.  ? 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 Her blood pressure issues began with her first pregnancy at 61 years old causing her to deliver early. Her second child also had to be delivered at [redacted] weeks gestation for similar reasons. Her children are 28 and 40 years old currently. She noted her blood pressure was difficult to control over the preceding year. Hydralazine was added to her regimen. She was referred to [facility] and enrolled in our  remote patient monitoring study. Her renin and aldosterone levels were indeterminate. CTA of the abdomen was unremarkable. She had minimal aortic atherosclerosis. She saw Tommy Medal, PharmD 11/2021 and noted feeling poorly when her BP was below 110. She also had been working with the [facility], and was started on [address]. Her clonidine dose was reduced. Her home blood pressures have remained well controlled on remote patient monitoring. ? ?Today, she is feeling good. However, she is concerned that her heart rate has been a little high when she checks it, sometimes over 100 bpm. At these times she is not at rest, but she is also not exercising. She describes one episode that occurred after walking 3-4 miles for exercise. About an hour after walking she checked her blood pressure and received a call from a nurse because her pulse was 125 bpm. She denies any palpitations. Generally she feels well while exercising, with no other symptoms. A few days ago she took an EKG with her smart watch because her heart rate was elevated. This showed her HR at 75 bpm, sinus rhythm. After review there are EKG tracings that do show sinus tachycardia. Near the end of February she stopped her clonidine. She denies any palpitations, chest pain, shortness of breath, or peripheral edema. No lightheadedness, headaches, syncope, orthopnea, or PND. ? ? ?Past Medical History:  ?Diagnosis Date  ? Allergy   ? Arthritis   ? "back" (11/07/2012)  ? Chest pain   ? Chronic lower back pain   ? Complication of anesthesia   ? somthing made her itch  ?  Constipation   ? GERD (gastroesophageal reflux disease)   ? Heart murmur   ? "MVP" (`11/07/2012)  ? History of blood transfusion 1990's  ? "? w/one of my deliveries" (11/07/2012)  ? Hyperlipidemia   ? LDL  ? Hypertension 04/03/2010  ? Joint pain   ? Kidney problem   ? Lactose intolerance   ? Mitral valve prolapse   ? Seizures ([place]) 1991  ? "related to pregnancy; I had HELLP"  (11/07/2012)- lastseizure was 1991 per pt   ? Sickle cell trait ([place])   ? Systemic lupus erythematosus ([place]) 04/03/2010  ? joint pain  ? Type II diabetes mellitus ([place]) 08/2011  ? Diet and exercise.  ? ? ?Past Surgical History:  ?Procedure Laterality Date  ? ABDOMINAL HYSTERECTOMY  1997  ? CARPAL TUNNEL RELEASE  2008  ? "right w/fracture OR" (11/07/2012)  ? [place]  ? COLONOSCOPY    ? POLYPECTOMY    ? POSTERIOR LUMBAR FUSION  11/07/2012  ? "L4-5" (11/07/2012)  ? TRIGGER FINGER RELEASE Right 05/04/2016  ? Procedure: MINOR RELEASE TRIGGER FINGER/A-1 PULLEY;  Surgeon: Charlotte Crumb, MD;  Location: [facility];  Service: Orthopedics;  Laterality: Right;  ? WRIST FRACTURE SURGERY  2008  ? "right" (11/07/2012)  ? ? ?Current Medications: ?Current Meds  ?Medication Sig  ? aspirin EC 81 MG tablet Take 81 mg by mouth every morning.  ? b complex vitamins capsule Take 1 capsule by mouth daily.  ? Cetirizine-Pseudoephedrine (ZYRTEC-D PO) Take 1 tablet by mouth daily as needed. For allergies  ? Cholecalciferol (VITAMIN D3 PO) Take 125 mcg by mouth daily.  ? COVID-19 mRNA bivalent vaccine, Pfizer, (PFIZER COVID-19 VAC BIVALENT) injection Inject into the muscle.  ? esomeprazole (NEXIUM) 20 MG packet Take 20 mg by mouth as needed.  ? estradiol (CLIMARA) 0.1 mg/24hr patch Place 1 patch (0.1 mg total) onto the skin once a week.  ? FREESTYLE LITE test strip   ? Lancets (FREESTYLE) lancets   ? meloxicam (MOBIC) 7.5 MG tablet Take 7.5 mg by mouth daily as needed for pain.  ? Multiple Vitamin (MULTIVITAMIN WITH MINERALS) TABS Take 1 tablet by mouth daily.  ? spironolactone (ALDACTONE) 25 MG tablet Take 1 tablet by mouth once a day  ? valACYclovir (VALTREX) 500 MG tablet Take 1 tablet (500 mg total) by mouth daily.  ? vitamin B-12 (CYANOCOBALAMIN) 1000 MCG tablet Take 5,000 mcg by mouth daily.  ? [DISCONTINUED] carvedilol (COREG) 12.5 MG tablet Take 1 tablet (12.5 mg total) by mouth 2 (two) times daily.   ? [DISCONTINUED] chlorthalidone (HYGROTON) 25 MG tablet Take 1 tablet (25 mg total) by mouth daily.  ? [DISCONTINUED] hydrALAZINE (APRESOLINE) 25 MG tablet Take 1 tablet (25 mg total) by mouth 2 (two) times daily at 8 am and 10 pm.  ? [DISCONTINUED] simvastatin (ZOCOR) 40 MG tablet Take 1 tablet (40 mg total) by mouth at bedtime.  ? [DISCONTINUED] tirzepatide (MOUNJARO) 5 MG/0.5ML Pen Inject 5 mg into the skin once a week.  ? [DISCONTINUED] valsartan (DIOVAN) 320 MG tablet Take 1 tablet (320 mg total) by mouth daily.  ?  ? ?Allergies:   Ciprofloxacin, Codeine, Hydrocodone, Levofloxacin, Ultram [tramadol hcl], Amlodipine, Amlodipine besylate, and Hydrocodone-acetaminophen  ? ?Social History  ? ?Socioeconomic History  ? Marital status: Single  ?  Spouse name: Not on file  ? Number of children: Not on file  ? Years of education: Not on file  ? Highest education level: Not on file  ?Occupational  History  ? Occupation: Cabin crew  ?  Employer: [employer]  ?Tobacco Use  ? Smoking status: Never  ? Smokeless tobacco: Never  ?Vaping Use  ? Vaping Use: Never used  ?Substance and Sexual Activity  ? Alcohol use: Yes  ?  Comment: 11/07/2012 "once/month might have a glass of wine"  ? Drug use: No  ? Sexual activity: Yes  ?  Birth control/protection: Surgical  ?Other Topics Concern  ? Not on file  ?Social History Narrative  ? Not on file  ? ?Social Determinants of Health  ? ?Financial Resource Strain: Low Risk   ? Difficulty of Paying Living Expenses: Not hard at all  ?Food Insecurity: No Food Insecurity  ? Worried About Charity fundraiser in the Last Year: Never true  ? Ran Out of Food in the Last Year: Never true  ?Transportation Needs: No Transportation Needs  ? Lack of Transportation (Medical): No  ? Lack of Transportation (Non-Medical): No  ?Physical Activity: Sufficiently Active  ? Days of Exercise per Week: 4 days  ? Minutes of Exercise per Session: 50 min  ?Stress: Not on file  ?Social Connections: Not on file  ?   ? ?Family History: ?The patient's family history includes Breast cancer in her paternal aunt; Heart attack in her father; Heart disease in an other family member; Hypertension in her father, mother, and a

## 2022-02-07 NOTE — Patient Instructions (Signed)
Medication Instructions:  ?STOP HYDRALAZINE  ? ?START CARVEDILOL 12.5 MG TWICE A DAY  ? ?Labwork: ?NONE ? ?Testing/Procedures: ?NONE ? ?Follow-Up: ?03/07/2022 AT 2:00 PM  ? ?Any Other Special Instructions Will Be Listed Below (If Applicable). ? ?CONTINUE TO MONITOR YOUR BLOOD PRESSURE AT HOME  ? ? ? ?

## 2022-02-07 NOTE — Assessment & Plan Note (Signed)
Keep working on diet and exercise

## 2022-02-10 ENCOUNTER — Other Ambulatory Visit (HOSPITAL_COMMUNITY): Payer: Self-pay

## 2022-02-15 ENCOUNTER — Encounter (HOSPITAL_BASED_OUTPATIENT_CLINIC_OR_DEPARTMENT_OTHER): Payer: Self-pay | Admitting: Cardiovascular Disease

## 2022-02-15 ENCOUNTER — Other Ambulatory Visit (HOSPITAL_COMMUNITY): Payer: Self-pay

## 2022-02-15 MED ORDER — CARVEDILOL 6.25 MG PO TABS
6.2500 mg | ORAL_TABLET | Freq: Two times a day (BID) | ORAL | 3 refills | Status: DC
  Filled 2022-02-15 – 2022-07-17 (×2): qty 180, 90d supply, fill #0
  Filled 2022-10-04: qty 180, 90d supply, fill #1

## 2022-02-19 ENCOUNTER — Encounter (INDEPENDENT_AMBULATORY_CARE_PROVIDER_SITE_OTHER): Payer: Self-pay | Admitting: Nurse Practitioner

## 2022-02-19 ENCOUNTER — Other Ambulatory Visit (HOSPITAL_COMMUNITY): Payer: Self-pay

## 2022-02-19 ENCOUNTER — Ambulatory Visit (INDEPENDENT_AMBULATORY_CARE_PROVIDER_SITE_OTHER): Payer: No Typology Code available for payment source | Admitting: Nurse Practitioner

## 2022-02-19 VITALS — BP 135/83 | HR 68 | Temp 97.7°F | Ht 66.0 in | Wt 183.0 lb

## 2022-02-19 DIAGNOSIS — E669 Obesity, unspecified: Secondary | ICD-10-CM

## 2022-02-19 DIAGNOSIS — Z7985 Long-term (current) use of injectable non-insulin antidiabetic drugs: Secondary | ICD-10-CM

## 2022-02-19 DIAGNOSIS — E1159 Type 2 diabetes mellitus with other circulatory complications: Secondary | ICD-10-CM

## 2022-02-19 DIAGNOSIS — E1169 Type 2 diabetes mellitus with other specified complication: Secondary | ICD-10-CM | POA: Diagnosis not present

## 2022-02-19 DIAGNOSIS — I152 Hypertension secondary to endocrine disorders: Secondary | ICD-10-CM | POA: Diagnosis not present

## 2022-02-19 DIAGNOSIS — Z6829 Body mass index (BMI) 29.0-29.9, adult: Secondary | ICD-10-CM

## 2022-02-19 MED ORDER — TIRZEPATIDE 5 MG/0.5ML ~~LOC~~ SOAJ
5.0000 mg | SUBCUTANEOUS | 0 refills | Status: DC
  Filled 2022-02-19: qty 2, 28d supply, fill #0

## 2022-02-20 ENCOUNTER — Other Ambulatory Visit (HOSPITAL_COMMUNITY): Payer: Self-pay

## 2022-02-20 NOTE — Progress Notes (Signed)
? ? ? ?Chief Complaint:  ? ?OBESITY ?Vickie Lane is here to discuss her progress with her obesity treatment plan along with follow-up of her obesity related diagnoses. Dorisann is on the [employer] and states she is following her eating plan approximately 100% of the time. Teriana states she is strength training 60 minutes 3 times per week. ? ?Today's visit was #: 9 ?Starting weight: 193 lbs ?Starting date: 09/06/2021 ?Today's weight: 183 lbs ?Today's date: 02/19/2022 ?Total lbs lost to date: 10 ?Total lbs lost since last in-office visit: 0 ? ?Interim History: Tanaya maintained her weight since her last visit. She is meeting protein and calories goals. Denies hunger, rarely cravings, drinking water daily. She was out of town this past weekend. ? ?Subjective:  ? ?1. Type 2 diabetes mellitus with other specified complication, without long-term current use of insulin ([place]) ?Ardine is taking Mounjaro '5mg'$ . She reports reflux/nausea/upset stomach. She is on ARB and statin. Her last fructosamine was at 269 on 01/03/2022. ? ?2. Hypertension associated with type 2 diabetes mellitus ([place]) ?Reda noticed some low blood pressures at home. This was dicussed with cardiology. Coreg decreased to 1/2 tablet twice a day, since decrease in dose, she denies dizziness. Blood pressure now at home 106/73, HR 70's-80's. She denies chest pain, shortness of breath, palpitations. She is currently in a study with cardiology. ? ?Assessment/Plan:  ? ?1. Type 2 diabetes mellitus with other specified complication, without long-term current use of insulin ([place]) ?We will refill Mounjaro '5mg'$ . Side effects discussed. ? ?- Refill tirzepatide (MOUNJARO) 5 MG/0.5ML Pen; Inject 5 mg into the skin once a week.  Dispense: 2 mL; Refill: 0 ? ?2. Hypertension associated with type 2 diabetes mellitus ([place]) ?Andersen will continue to follow up with cardiologist. She will continue medication as directed. ? ?3. Obesity with current BMI 29.6 ?Jaritza is  currently in the action stage of change. As such, her goal is to continue with weight loss efforts. She has agreed to the [employer].  ? ?Exercise goals:  As is.  ? ?Behavioral modification strategies: increasing lean protein intake and increasing water intake. ? ?Amazin has agreed to follow-up with our clinic in 3 weeks. She was informed of the importance of frequent follow-up visits to maximize her success with intensive lifestyle modifications for her multiple health conditions.  ? ?Objective:  ? ?Blood pressure 135/83, pulse 68, temperature 97.7 ?F (36.5 ?C), height '5\' 6"'$  (1.676 m), weight 183 lb (83 kg), SpO2 99 %. ?Body mass index is 29.54 kg/m?. ? ?General: Cooperative, alert, well developed, in no acute distress. ?HEENT: Conjunctivae and lids unremarkable. ?Cardiovascular: Regular rhythm.  ?Lungs: Normal work of breathing. ?Neurologic: No focal deficits.  ? ?Lab Results  ?Component Value Date  ? CREATININE 1.03 (H) 10/27/2021  ? BUN 15 10/27/2021  ? NA 143 10/27/2021  ? K 4.0 10/27/2021  ? CL 105 10/27/2021  ? CO2 25 10/27/2021  ? ?Lab Results  ?Component Value Date  ? ALT 16 03/29/2016  ? AST 19 03/29/2016  ? ALKPHOS 71 03/29/2016  ? BILITOT 0.3 03/29/2016  ? ?Lab Results  ?Component Value Date  ? HGBA1C 5.7 (H) 03/29/2016  ? ?Lab Results  ?Component Value Date  ? INSULIN 5.9 09/06/2021  ? ?Lab Results  ?Component Value Date  ? TSH 1.820 08/08/2021  ? ?Lab Results  ?Component Value Date  ? CHOL 170 03/29/2016  ? HDL 83 03/29/2016  ? [place] 79 03/29/2016  ? TRIG 41 03/29/2016  ? CHOLHDL 2.0 03/29/2016  ? ?  Lab Results  ?Component Value Date  ? VD25OH 71.7 09/06/2021  ? ?Lab Results  ?Component Value Date  ? WBC 4.3 03/29/2016  ? HGB 13.6 03/29/2016  ? HCT 40.5 03/29/2016  ? MCV 83.0 03/29/2016  ? PLT 272 03/29/2016  ? ?No results found for: IRON, TIBC, FERRITIN ? ?Attestation Statements:  ? ?Reviewed by clinician on day of visit: allergies, medications, problem list, medical history, surgical history,  family history, social history, and previous encounter notes. ? ?I, Brendell Tyus, am acting as transcriptionist for Everardo Pacific, FNP.Marland Kitchen ? ?I have reviewed the above documentation for accuracy and completeness, and I agree with the above. Everardo Pacific, FNP  ?

## 2022-02-21 ENCOUNTER — Other Ambulatory Visit (HOSPITAL_COMMUNITY): Payer: Self-pay

## 2022-03-06 ENCOUNTER — Encounter (HOSPITAL_BASED_OUTPATIENT_CLINIC_OR_DEPARTMENT_OTHER): Payer: Self-pay | Admitting: Cardiovascular Disease

## 2022-03-07 ENCOUNTER — Ambulatory Visit (HOSPITAL_BASED_OUTPATIENT_CLINIC_OR_DEPARTMENT_OTHER): Payer: No Typology Code available for payment source

## 2022-03-13 ENCOUNTER — Ambulatory Visit (INDEPENDENT_AMBULATORY_CARE_PROVIDER_SITE_OTHER): Payer: No Typology Code available for payment source | Admitting: Pharmacist

## 2022-03-13 ENCOUNTER — Ambulatory Visit (INDEPENDENT_AMBULATORY_CARE_PROVIDER_SITE_OTHER): Payer: No Typology Code available for payment source | Admitting: Physician Assistant

## 2022-03-13 ENCOUNTER — Encounter: Payer: Self-pay | Admitting: Pharmacist

## 2022-03-13 ENCOUNTER — Other Ambulatory Visit (HOSPITAL_COMMUNITY): Payer: Self-pay

## 2022-03-13 ENCOUNTER — Encounter (INDEPENDENT_AMBULATORY_CARE_PROVIDER_SITE_OTHER): Payer: Self-pay | Admitting: Physician Assistant

## 2022-03-13 VITALS — BP 137/88 | HR 72 | Resp 14 | Ht 66.0 in | Wt 188.2 lb

## 2022-03-13 VITALS — BP 127/85 | HR 69 | Temp 97.6°F | Ht 66.0 in | Wt 183.0 lb

## 2022-03-13 DIAGNOSIS — J301 Allergic rhinitis due to pollen: Secondary | ICD-10-CM | POA: Insufficient documentation

## 2022-03-13 DIAGNOSIS — I2510 Atherosclerotic heart disease of native coronary artery without angina pectoris: Secondary | ICD-10-CM | POA: Insufficient documentation

## 2022-03-13 DIAGNOSIS — I129 Hypertensive chronic kidney disease with stage 1 through stage 4 chronic kidney disease, or unspecified chronic kidney disease: Secondary | ICD-10-CM | POA: Insufficient documentation

## 2022-03-13 DIAGNOSIS — M19049 Primary osteoarthritis, unspecified hand: Secondary | ICD-10-CM | POA: Insufficient documentation

## 2022-03-13 DIAGNOSIS — N182 Chronic kidney disease, stage 2 (mild): Secondary | ICD-10-CM | POA: Insufficient documentation

## 2022-03-13 DIAGNOSIS — M329 Systemic lupus erythematosus, unspecified: Secondary | ICD-10-CM | POA: Insufficient documentation

## 2022-03-13 DIAGNOSIS — E7800 Pure hypercholesterolemia, unspecified: Secondary | ICD-10-CM | POA: Insufficient documentation

## 2022-03-13 DIAGNOSIS — Z780 Asymptomatic menopausal state: Secondary | ICD-10-CM | POA: Insufficient documentation

## 2022-03-13 DIAGNOSIS — Z6829 Body mass index (BMI) 29.0-29.9, adult: Secondary | ICD-10-CM | POA: Diagnosis not present

## 2022-03-13 DIAGNOSIS — Z7985 Long-term (current) use of injectable non-insulin antidiabetic drugs: Secondary | ICD-10-CM | POA: Diagnosis not present

## 2022-03-13 DIAGNOSIS — I152 Hypertension secondary to endocrine disorders: Secondary | ICD-10-CM

## 2022-03-13 DIAGNOSIS — E1169 Type 2 diabetes mellitus with other specified complication: Secondary | ICD-10-CM

## 2022-03-13 DIAGNOSIS — E669 Obesity, unspecified: Secondary | ICD-10-CM

## 2022-03-13 DIAGNOSIS — E1159 Type 2 diabetes mellitus with other circulatory complications: Secondary | ICD-10-CM | POA: Diagnosis not present

## 2022-03-13 DIAGNOSIS — K219 Gastro-esophageal reflux disease without esophagitis: Secondary | ICD-10-CM | POA: Insufficient documentation

## 2022-03-13 DIAGNOSIS — E1121 Type 2 diabetes mellitus with diabetic nephropathy: Secondary | ICD-10-CM | POA: Insufficient documentation

## 2022-03-13 MED ORDER — TIRZEPATIDE 5 MG/0.5ML ~~LOC~~ SOAJ
5.0000 mg | SUBCUTANEOUS | 0 refills | Status: DC
  Filled 2022-03-13: qty 2, 28d supply, fill #0

## 2022-03-13 NOTE — Progress Notes (Signed)
Patient ID: Vickie Lane                 DOB: 07/13/1961                      MRN: [id number] ? ? ? ? ?HPI: ?Vickie Lane is a 61 y.o. female referred by Dr. Oval Linsey to HTN clinic. PMH is significant for HTN, T2DM, CAD, CKD, HLD, lupus, and sickle cell trait. ? ?Patient presents today in good spirits. Enrolled in HTN project but had early follow up with Dr Oval Linsey due to tachycardia.  Patient was started on carvedilol but then called back stating that BP was low and she was experiencing dizziness.  Carvedilol was reduced to 6.'25mg'$  BID and patient feels much better. ? ?Is currently enrolled in [facility]. Eats a pescatarian diet and is on Mounjaro 0.'5mg'$  weekly.  It has curbed her appetitie, however [facility] wants her to eat more.  She has lost 4# since starting Mounjaro, 10# overall since starting medical weight loss management. ? ?Currently managed on carvedilol, spironolactone, valsartan, and spironolactone with no adverse effects.   ? ?Recent BP readings: ?4/24: 124/93 ?4/23: 110/69 ?4/22: 111/76 ?4/21: 124/88 ?4/20: 108/77 ? ?Pulse rates typically in 70s with occasional elevated outliers. ? ?Current HTN meds:  ?Carvedilol 6.'25mg'$  BID ?Chlorthalidone '2mg'$  daily ?Valsartan '320mg'$  daily ?Spironolactone '25mg'$  daily ? ?Previously tried: amlodipine (swelling) ?BP goal: <130/80 ? ? ?Wt Readings from Last 3 Encounters:  ?03/13/22 183 lb (83 kg)  ?02/19/22 183 lb (83 kg)  ?02/07/22 188 lb 9.6 oz (85.5 kg)  ? ?BP Readings from Last 3 Encounters:  ?03/13/22 127/85  ?02/19/22 135/83  ?02/07/22 118/83  ? ?Pulse Readings from Last 3 Encounters:  ?03/13/22 69  ?02/19/22 68  ?02/07/22 83  ? ? ?Renal function: ?CrCl cannot be calculated (Patient's most recent lab result is older than the maximum 21 days allowed.). ? ?Past Medical History:  ?Diagnosis Date  ? Allergy   ? Arthritis   ? "back" (11/07/2012)  ? Chest pain   ? Chronic lower back pain   ? Complication of anesthesia   ?  somthing made her itch  ? Constipation   ? GERD (gastroesophageal reflux disease)   ? Heart murmur   ? "MVP" (`11/07/2012)  ? History of blood transfusion 1990's  ? "? w/one of my deliveries" (11/07/2012)  ? Hyperlipidemia   ? LDL  ? Hypertension 04/03/2010  ? Joint pain   ? Kidney problem   ? Lactose intolerance   ? Mitral valve prolapse   ? Seizures ([place]) 1991  ? "related to pregnancy; I had HELLP" (11/07/2012)- lastseizure was 1991 per pt   ? Sickle cell trait ([place])   ? Systemic lupus erythematosus ([place]) 04/03/2010  ? joint pain  ? Type II diabetes mellitus ([place]) 08/2011  ? Diet and exercise.  ? ? ?Current Outpatient Medications on File Prior to Visit  ?Medication Sig Dispense Refill  ? aspirin EC 81 MG tablet Take 81 mg by mouth every morning.    ? b complex vitamins capsule Take 1 capsule by mouth daily.    ? carvedilol (COREG) 6.25 MG tablet Take 1 tablet (6.25 mg total) by mouth 2 (two) times daily. 180 tablet 3  ? Cetirizine-Pseudoephedrine (ZYRTEC-D PO) Take 1 tablet by mouth daily as needed. For allergies    ? chlorthalidone (HYGROTON) 25 MG tablet Take 1 tablet (25 mg total) by mouth daily. 90 tablet 3  ? Cholecalciferol (  VITAMIN D3 PO) Take 125 mcg by mouth daily.    ? COVID-19 mRNA bivalent vaccine, Pfizer, (PFIZER COVID-19 VAC BIVALENT) injection Inject into the muscle. 0.3 mL 0  ? esomeprazole (NEXIUM) 20 MG packet Take 20 mg by mouth as needed.    ? estradiol (CLIMARA) 0.1 mg/24hr patch Place 1 patch (0.1 mg total) onto the skin once a week. 4 patch 12  ? FREESTYLE LITE test strip   11  ? Lancets (FREESTYLE) lancets   12  ? meloxicam (MOBIC) 7.5 MG tablet Take 7.5 mg by mouth daily as needed for pain.    ? Multiple Vitamin (MULTIVITAMIN WITH MINERALS) TABS Take 1 tablet by mouth daily.    ? simvastatin (ZOCOR) 40 MG tablet Take 1 tablet (40 mg total) by mouth at bedtime. 90 tablet 3  ? spironolactone (ALDACTONE) 25 MG tablet Take 1 tablet by mouth once a day 90 tablet 3  ? valACYclovir (VALTREX) 500  MG tablet Take 1 tablet (500 mg total) by mouth daily. 30 tablet 11  ? valsartan (DIOVAN) 320 MG tablet Take 1 tablet (320 mg total) by mouth daily. 90 tablet 3  ? vitamin B-12 (CYANOCOBALAMIN) 1000 MCG tablet Take 5,000 mcg by mouth daily.    ? ?No current facility-administered medications on file prior to visit.  ? ? ?Allergies  ?Allergen Reactions  ? Ciprofloxacin Anaphylaxis  ? Codeine Nausea Only, Other (See Comments), Anaphylaxis and Nausea And Vomiting  ?  "dry heaves" (11/07/2012) ?Other reaction(s): stomach upset  ? Hydrocodone Nausea Only and Other (See Comments)  ?  "dry heaves" (11/07/2012)  ? Levofloxacin Anaphylaxis and Rash  ?  Other reaction(s): anaphylaxis  ? Ultram [Tramadol Hcl] Nausea Only and Other (See Comments)  ?  "dry heaves" (11/07/2012)  ? Amlodipine Swelling  ? Amlodipine Besylate   ?  Other reaction(s): anaphylaxis  ? Hydrocodone-Acetaminophen   ?  Other reaction(s): stomach upset  ? ? ? ?Assessment/Plan: ? ?1. Hypertension -  Patient BP in room 137/88 which is above goal of <130/80 however the majority of home readings at home. 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  Is physically active and following what [facility] has been teaching her however is looking for other protein options.  Gave patient other vegetarian food options which she was appreciative of.  Patient would like to follow up to discuss diet/DM more in detail now that BP is better controlled.  No med changes needed at this time. ? ?Continue: ?Carvedilol 6.'25mg'$  BID ?Chlorthalidone '25mg'$  daily ?Valsartan '320mg'$  daily ?Spironolactone '25mg'$  daily ?Recheck in 4 weeks ? ?Karren Cobble, PharmD, BCACP, [place], CPP ?[address], [address] ?[place], [place], [address] ?Phone: [phone], Fax: [phone]  ?

## 2022-03-13 NOTE — Patient Instructions (Addendum)
It was nice meeting you today ? ?We would like your blood pressure to be less than 130/80 ? ?Please continue your: ? ?Carvedilol 6.'25mg'$  BID ?Chlorthalidone '2mg'$  daily ?Valsartan '320mg'$  daily ?Spironolactone '25mg'$  daily ? ?Continue your exercise and diet weight loss plan ? ?We will see you back in 1 month ? ?Karren Cobble, PharmD, BCACP, [place], CPP ?[address], [address] ?[place], [place], [address] ?Phone: [phone], Fax: [phone]  ?

## 2022-03-18 NOTE — Progress Notes (Signed)
? ? ? ?Chief Complaint:  ? ?OBESITY ?Vickie Lane is here to discuss her progress with her obesity treatment plan along with follow-up of her obesity related diagnoses. Vickie Lane is on the [employer] and states she is following her eating plan approximately 75% of the time. Vickie Lane states she is doing strengthening for 30 minutes 2 times per week. ? ?Today's visit was #: 10 ?Starting weight: 193 lbs ?Starting date: 09/06/2021 ?Today's weight: 183 lbs ?Today's date: 03/13/2022 ?Total lbs lost to date: 10 ?Total lbs lost since last in-office visit: 0 ? ?Interim History: Vickie Lane has added 1-2 protein shakes daily to try to reach her protein goal. She is eating apricots for breakfast, and seafood for lunch and dinner. She is unsure of her calories and protein. She denies excessive hunger on Mounjaro 5 mg. ? ?Subjective:  ? ?1. Type 2 diabetes mellitus with other specified complication, without long-term current use of insulin ([place]) ?Vickie Lane is on Mounjaro 5 mg. Her fructosamine was 269 on 01/03/2022. ? ?Assessment/Plan:  ? ?1. Type 2 diabetes mellitus with other specified complication, without long-term current use of insulin ([place]) ?Vickie Lane will continue Mounjaro at 5 mg weekly, and we will refill for 1 month. ? ?- tirzepatide (MOUNJARO) 5 MG/0.5ML Pen; Inject 5 mg into the skin once a week.  Dispense: 2 mL; Refill: 0 ? ?2. Obesity with current BMI 29.6 ?Vickie Lane is currently in the action stage of change. As such, her goal is to continue with weight loss efforts. She has agreed to keeping a food journal and adhering to recommended goals of 1100 calories and 90 grams of protein daily.  ? ?Consider decreasing dose of Mounjaro if unable to reach calorie and protein goals.  ? ?Exercise goals: As is. ? ?Behavioral modification strategies: meal planning and cooking strategies, planning for success, and keeping a strict food journal. ? ?Vickie Lane has agreed to follow-up with our clinic in 3 weeks. She was informed of the  importance of frequent follow-up visits to maximize her success with intensive lifestyle modifications for her multiple health conditions.  ? ?Objective:  ? ?Blood pressure 127/85, pulse 69, temperature 97.6 ?F (36.4 ?C), height '5\' 6"'$  (1.676 m), weight 183 lb (83 kg), SpO2 100 %. ?Body mass index is 29.54 kg/m?. ? ?General: Cooperative, alert, well developed, in no acute distress. ?HEENT: Conjunctivae and lids unremarkable. ?Cardiovascular: Regular rhythm.  ?Lungs: Normal work of breathing. ?Neurologic: No focal deficits.  ? ?Lab Results  ?Component Value Date  ? CREATININE 1.03 (H) 10/27/2021  ? BUN 15 10/27/2021  ? NA 143 10/27/2021  ? K 4.0 10/27/2021  ? CL 105 10/27/2021  ? CO2 25 10/27/2021  ? ?Lab Results  ?Component Value Date  ? ALT 16 03/29/2016  ? AST 19 03/29/2016  ? ALKPHOS 71 03/29/2016  ? BILITOT 0.3 03/29/2016  ? ?Lab Results  ?Component Value Date  ? HGBA1C 5.7 (H) 03/29/2016  ? ?Lab Results  ?Component Value Date  ? INSULIN 5.9 09/06/2021  ? ?Lab Results  ?Component Value Date  ? TSH 1.820 08/08/2021  ? ?Lab Results  ?Component Value Date  ? CHOL 170 03/29/2016  ? HDL 83 03/29/2016  ? [place] 79 03/29/2016  ? TRIG 41 03/29/2016  ? CHOLHDL 2.0 03/29/2016  ? ?Lab Results  ?Component Value Date  ? VD25OH 71.7 09/06/2021  ? ?Lab Results  ?Component Value Date  ? WBC 4.3 03/29/2016  ? HGB 13.6 03/29/2016  ? HCT 40.5 03/29/2016  ? MCV 83.0 03/29/2016  ? PLT 272 03/29/2016  ? ?  No results found for: IRON, TIBC, FERRITIN ? ?Attestation Statements:  ? ?Reviewed by clinician on day of visit: allergies, medications, problem list, medical history, surgical history, family history, social history, and previous encounter notes. ? ?Time spent on visit including pre-visit chart review and post-visit care and charting was 31 minutes.  ? ? ?I, Trixie Dredge, am acting as transcriptionist for Abby Potash, PA-C. ? ?I have reviewed the above documentation for accuracy and completeness, and I agree with the above. Abby Potash, PA-C ? ? ?

## 2022-03-21 ENCOUNTER — Other Ambulatory Visit (HOSPITAL_COMMUNITY): Payer: Self-pay

## 2022-03-27 ENCOUNTER — Telehealth: Payer: Self-pay

## 2022-03-27 DIAGNOSIS — Z0000 Encounter for general adult medical examination without abnormal findings: Secondary | ICD-10-CM

## 2022-03-27 NOTE — Telephone Encounter (Signed)
Called patient to inform her to return her [employer] device at her next visit with PharmD on 5/18. Was not able to complete call with [employer]. ? ?Avelino Leeds, [place] ?[facility] ?Care Guide, Health Coach ?[address] ?[place] [address] ?Telephone: [phone] ?Email: [email or web address] ? ?

## 2022-03-29 ENCOUNTER — Other Ambulatory Visit (HOSPITAL_COMMUNITY): Payer: Self-pay

## 2022-04-05 ENCOUNTER — Ambulatory Visit: Payer: No Typology Code available for payment source

## 2022-04-05 ENCOUNTER — Ambulatory Visit (INDEPENDENT_AMBULATORY_CARE_PROVIDER_SITE_OTHER): Payer: No Typology Code available for payment source | Admitting: Nurse Practitioner

## 2022-04-13 ENCOUNTER — Telehealth: Payer: Self-pay

## 2022-04-13 DIAGNOSIS — Z0000 Encounter for general adult medical examination without abnormal findings: Secondary | ICD-10-CM

## 2022-04-13 NOTE — Telephone Encounter (Signed)
Called patient to determine if she had receive the message I sent through the [place] portal yesterday regarding checking her bp/connectivity issues. Patient stated that she turned in the device at her last pharmacy appointment no 4/25 because she was told that she had completed the program. Patient mentioned that she had cancelled her last appointment regarding weight loss with the pharmacy due to stopping the medication Mounjaro. Patient is interested in speaking with Gerald Stabs in PharmD and possibly reschedule appointment. Sent Gerald Stabs a message to inform him of patient's request for a callback. Completed patient out of the [place] program as well.   Elison Worrel Truman [place], [facility] Guide, Health Coach [address], [address] [place] [address] Telephone: [phone] Email: [email or web address]

## 2022-04-17 ENCOUNTER — Other Ambulatory Visit (HOSPITAL_COMMUNITY): Payer: Self-pay

## 2022-04-18 ENCOUNTER — Other Ambulatory Visit (HOSPITAL_COMMUNITY): Payer: Self-pay

## 2022-05-14 ENCOUNTER — Other Ambulatory Visit (HOSPITAL_COMMUNITY): Payer: Self-pay

## 2022-05-15 ENCOUNTER — Other Ambulatory Visit (HOSPITAL_COMMUNITY): Payer: Self-pay

## 2022-06-16 ENCOUNTER — Other Ambulatory Visit (HOSPITAL_COMMUNITY): Payer: Self-pay

## 2022-06-27 ENCOUNTER — Encounter (INDEPENDENT_AMBULATORY_CARE_PROVIDER_SITE_OTHER): Payer: Self-pay

## 2022-06-27 ENCOUNTER — Other Ambulatory Visit (HOSPITAL_COMMUNITY): Payer: Self-pay

## 2022-06-28 ENCOUNTER — Other Ambulatory Visit (HOSPITAL_COMMUNITY): Payer: Self-pay

## 2022-06-28 MED ORDER — SPIRONOLACTONE 25 MG PO TABS
25.0000 mg | ORAL_TABLET | Freq: Every day | ORAL | 0 refills | Status: DC
  Filled 2022-06-28: qty 90, 90d supply, fill #0

## 2022-06-28 NOTE — Telephone Encounter (Signed)
Patient seen by Sherian Rein D 4/25

## 2022-06-29 ENCOUNTER — Other Ambulatory Visit (HOSPITAL_COMMUNITY): Payer: Self-pay

## 2022-07-03 ENCOUNTER — Other Ambulatory Visit: Payer: Self-pay | Admitting: Internal Medicine

## 2022-07-03 ENCOUNTER — Telehealth: Payer: Self-pay

## 2022-07-03 DIAGNOSIS — Z006 Encounter for examination for normal comparison and control in clinical research program: Secondary | ICD-10-CM

## 2022-07-03 DIAGNOSIS — Z1231 Encounter for screening mammogram for malignant neoplasm of breast: Secondary | ICD-10-CM

## 2022-07-03 NOTE — Telephone Encounter (Signed)
Entered in Error

## 2022-07-03 NOTE — Research (Signed)
Pt called Research Office in reference to a letter mailed to her for a follow up/ missed appointment. Pt is enrolled in Dr. Blenda Mounts HTN Virtual Trial in Group 2. Pt stated she was marked complete in [place] by Avelino Leeds on 04/13/2022. Pt's Cantril's Ladder and follow up survey was done over the phone. Pt's survey is charted in [employer].   Cantril's Ladder  Please imagine a ladder with steps numbered from zero at the bottom to ten at the top. The top of the ladder represents the best possible life for you and the bottom of the ladder represents the worst possible life for you.   Indicate on the ladder where you feel you personally stand right now: 8  Indicate on the ladder where you feel you will stand in 5 years: 10

## 2022-07-05 NOTE — Addendum Note (Signed)
Addended by: Skeet Latch C on: 07/05/2022 02:41 PM   Modules accepted: Orders

## 2022-07-11 ENCOUNTER — Other Ambulatory Visit (HOSPITAL_COMMUNITY): Payer: Self-pay

## 2022-07-16 ENCOUNTER — Other Ambulatory Visit (HOSPITAL_COMMUNITY): Payer: Self-pay

## 2022-07-18 ENCOUNTER — Other Ambulatory Visit (HOSPITAL_COMMUNITY): Payer: Self-pay

## 2022-07-25 ENCOUNTER — Other Ambulatory Visit (HOSPITAL_COMMUNITY): Payer: Self-pay

## 2022-07-26 ENCOUNTER — Other Ambulatory Visit (HOSPITAL_COMMUNITY): Payer: Self-pay

## 2022-07-27 ENCOUNTER — Ambulatory Visit
Admission: RE | Admit: 2022-07-27 | Discharge: 2022-07-27 | Disposition: A | Discharge status: Home / Self Care | Payer: No Typology Code available for payment source | Source: Ambulatory Visit | Attending: Internal Medicine | Admitting: Internal Medicine

## 2022-07-27 DIAGNOSIS — Z1231 Encounter for screening mammogram for malignant neoplasm of breast: Secondary | ICD-10-CM

## 2022-07-31 ENCOUNTER — Other Ambulatory Visit: Payer: Self-pay | Admitting: Internal Medicine

## 2022-07-31 DIAGNOSIS — R928 Other abnormal and inconclusive findings on diagnostic imaging of breast: Secondary | ICD-10-CM

## 2022-08-10 ENCOUNTER — Other Ambulatory Visit (HOSPITAL_COMMUNITY): Payer: Self-pay

## 2022-08-13 ENCOUNTER — Other Ambulatory Visit: Payer: Self-pay | Admitting: Internal Medicine

## 2022-08-13 ENCOUNTER — Ambulatory Visit
Admission: RE | Admit: 2022-08-13 | Discharge: 2022-08-13 | Disposition: A | Discharge status: Home / Self Care | Payer: No Typology Code available for payment source | Source: Ambulatory Visit | Attending: Internal Medicine | Admitting: Internal Medicine

## 2022-08-13 DIAGNOSIS — R928 Other abnormal and inconclusive findings on diagnostic imaging of breast: Secondary | ICD-10-CM

## 2022-08-13 DIAGNOSIS — N6320 Unspecified lump in the left breast, unspecified quadrant: Secondary | ICD-10-CM

## 2022-08-13 DIAGNOSIS — R921 Mammographic calcification found on diagnostic imaging of breast: Secondary | ICD-10-CM

## 2022-08-21 ENCOUNTER — Ambulatory Visit
Admission: RE | Admit: 2022-08-21 | Discharge: 2022-08-21 | Disposition: A | Discharge status: Home / Self Care | Payer: No Typology Code available for payment source | Source: Ambulatory Visit | Attending: Internal Medicine | Admitting: Internal Medicine

## 2022-08-21 ENCOUNTER — Other Ambulatory Visit: Payer: Self-pay | Admitting: Internal Medicine

## 2022-08-21 DIAGNOSIS — N6320 Unspecified lump in the left breast, unspecified quadrant: Secondary | ICD-10-CM

## 2022-08-21 DIAGNOSIS — R921 Mammographic calcification found on diagnostic imaging of breast: Secondary | ICD-10-CM

## 2022-08-23 ENCOUNTER — Telehealth: Payer: Self-pay | Admitting: Hematology and Oncology

## 2022-08-23 NOTE — Telephone Encounter (Signed)
Spoke to patient to confirm morning clinic appointment for 10/18 will email packet to patient

## 2022-08-31 ENCOUNTER — Encounter: Payer: Self-pay | Admitting: *Deleted

## 2022-08-31 DIAGNOSIS — D0512 Intraductal carcinoma in situ of left breast: Secondary | ICD-10-CM

## 2022-09-04 ENCOUNTER — Ambulatory Visit: Payer: Self-pay | Admitting: Surgery

## 2022-09-04 DIAGNOSIS — D0512 Intraductal carcinoma in situ of left breast: Secondary | ICD-10-CM

## 2022-09-04 NOTE — Progress Notes (Signed)
Radiation Oncology         [phone] ________________________________  Multidisciplinary Breast Oncology Clinic [facility]) Initial Outpatient Consultation  Name: Vickie Lane MRN: [id number]  Date: 09/05/2022  DOB: 1961-11-06  TM:[id number], Margaretha Sheffield, MD  Donnie Mesa, MD   REFERRING PHYSICIAN: Donnie Mesa, MD  DIAGNOSIS: There were no encounter diagnoses.  Stage 0 (cTis (DCIS), cN0, cM0) Left Breast, Intermediate grade DCIS, ER+ / PR+ / Her2 not assessed  No diagnosis found.  HISTORY OF PRESENT ILLNESS::Vickie Lane is a 61 y.o. female who is presenting to the office today for evaluation of her newly diagnosed breast cancer. She is accompanied by ***. She is doing well overall.   She had routine screening mammography on 07/27/22 showing a possible abnormality in the left breast. She underwent unilateral left breast diagnostic mammography with tomography and left breast ultrasonography at [facility] on 08/13/22 showing: indeterminate calcifications in the upper outer quadrant of the left breast, and a 1.2 cm hypoechoic mass versus an abnormal intramammary lymph node, at the 2 o'clock position of the left breast, 7 cmfn.   Biopsy of the upper outer left breast on the date of 08/21/22 showed: intermediate grade DCIS measuring 0.7 cm in the greatest linear extent with necrosis and calcifications. Prognostic indicators significant for: estrogen receptor, 95% positive and progesterone receptor, 40% positive, both with strong staining intensity. HER2 not assessed.   Menarche: *** years old Age at first live birth: *** years old GP: *** LMP: *** Contraceptive: *** HRT: ***   The patient was referred today for presentation in the multidisciplinary conference.  Radiology studies and pathology slides were presented there for review and discussion of treatment options.  A consensus was discussed regarding potential next steps.  PREVIOUS RADIATION THERAPY:  {EXAM; YES/NO:19492::"No"}  PAST MEDICAL HISTORY:  Past Medical History:  Diagnosis Date   Allergy    Arthritis    "back" (11/07/2012)   Chest pain    Chronic lower back pain    Complication of anesthesia    somthing made her itch   Constipation    GERD (gastroesophageal reflux disease)    Heart murmur    "MVP" (`11/07/2012)   History of blood transfusion 1990's   "? w/one of my deliveries" (11/07/2012)   Hyperlipidemia    LDL   Hypertension 04/03/2010   Joint pain    Kidney problem    Lactose intolerance    Mitral valve prolapse    Seizures ([place]) 1991   "related to pregnancy; I had HELLP" (11/07/2012)- lastseizure was 1991 per pt    Sickle cell trait ([place])    Systemic lupus erythematosus ([place]) 04/03/2010   joint pain   Type II diabetes mellitus ([place]) 08/2011   Diet and exercise.    PAST SURGICAL HISTORY: Past Surgical History:  Procedure Laterality Date   ABDOMINAL HYSTERECTOMY  1997   CARPAL TUNNEL RELEASE  2008   "right w/fracture OR" (11/07/2012)   [place]  11/07/2012   "L4-5" (11/07/2012)   TRIGGER FINGER RELEASE Right 05/04/2016   Procedure: MINOR RELEASE TRIGGER FINGER/A-1 PULLEY;  Surgeon: Charlotte Crumb, MD;  Location: [facility];  Service: Orthopedics;  Laterality: Right;   WRIST FRACTURE SURGERY  2008   "right" (11/07/2012)    FAMILY HISTORY:  Family History  Problem Relation Age of Onset   Hypertension Mother    Hypertension Father  Heart attack Father    Sudden death Father    Breast cancer Paternal Aunt    Heart disease Other    Hypertension Other    Colon cancer Neg Hx    Colon polyps Neg Hx    Rectal cancer Neg Hx    Stomach cancer Neg Hx    Esophageal cancer Neg Hx     SOCIAL HISTORY:  Social History   Socioeconomic History   Marital status: Single    Spouse name: Not on file   Number of children: Not on file   Years of education:  Not on file   Highest education level: Not on file  Occupational History   Occupation: Lawyer: [employer]  Tobacco Use   Smoking status: Never   Smokeless tobacco: Never  Vaping Use   Vaping Use: Never used  Substance and Sexual Activity   Alcohol use: Yes    Comment: 11/07/2012 "once/month might have a glass of wine"   Drug use: No   Sexual activity: Yes    Birth control/protection: Surgical  Other Topics Concern   Not on file  Social History Narrative   Not on file   Social Determinants of Health   Financial Resource Strain: Low Risk  (10/03/2021)   Overall Financial Resource Strain (CARDIA)    Difficulty of Paying Living Expenses: Not hard at all  Food Insecurity: No Food Insecurity (10/03/2021)   Hunger Vital Sign    Worried About Running Out of Food in the Last Year: Never true    [place] in the Last Year: Never true  Transportation Needs: No Transportation Needs (10/03/2021)   PRAPARE - Hydrologist (Medical): No    Lack of Transportation (Non-Medical): No  Physical Activity: Sufficiently Active (10/03/2021)   Exercise Vital Sign    Days of Exercise per Week: 4 days    Minutes of Exercise per Session: 50 min  Stress: Not on file  Social Connections: Not on file    ALLERGIES:  Allergies  Allergen Reactions   Ciprofloxacin Anaphylaxis   Codeine Nausea Only, Other (See Comments), Anaphylaxis and Nausea And Vomiting    "dry heaves" (11/07/2012) Other reaction(s): stomach upset   Hydrocodone Nausea Only and Other (See Comments)    "dry heaves" (11/07/2012)   Levofloxacin Anaphylaxis and Rash    Other reaction(s): anaphylaxis   Ultram [Tramadol Hcl] Nausea Only and Other (See Comments)    "dry heaves" (11/07/2012)   Amlodipine Swelling   Amlodipine Besylate     Other reaction(s): anaphylaxis   Hydrocodone-Acetaminophen     Other reaction(s): stomach upset    MEDICATIONS:  Current Outpatient  Medications  Medication Sig Dispense Refill   aspirin EC 81 MG tablet Take 81 mg by mouth every morning.     b complex vitamins capsule Take 1 capsule by mouth daily.     carvedilol (COREG) 6.25 MG tablet Take 1 tablet (6.25 mg total) by mouth 2 (two) times daily. 180 tablet 3   Cetirizine-Pseudoephedrine (ZYRTEC-D PO) Take 1 tablet by mouth daily as needed. For allergies     chlorthalidone (HYGROTON) 25 MG tablet Take 1 tablet (25 mg total) by mouth daily. 90 tablet 3   Cholecalciferol (VITAMIN D3 PO) Take 125 mcg by mouth daily.     COVID-19 mRNA bivalent vaccine, Pfizer, (PFIZER COVID-19 VAC BIVALENT) injection Inject into the muscle. 0.3 mL 0   esomeprazole (NEXIUM) 20 MG packet Take 20 mg  by mouth as needed.     estradiol (CLIMARA) 0.1 mg/24hr patch Place 1 patch (0.1 mg total) onto the skin once a week. 4 patch 12   FREESTYLE LITE test strip   11   Lancets (FREESTYLE) lancets   12   meloxicam (MOBIC) 7.5 MG tablet Take 7.5 mg by mouth daily as needed for pain.     Multiple Vitamin (MULTIVITAMIN WITH MINERALS) TABS Take 1 tablet by mouth daily.     simvastatin (ZOCOR) 40 MG tablet Take 1 tablet (40 mg total) by mouth at bedtime. 90 tablet 3   spironolactone (ALDACTONE) 25 MG tablet Take 1 tablet by mouth once a day 90 tablet 0   tirzepatide (MOUNJARO) 5 MG/0.5ML Pen Inject 5 mg into the skin once a week. 2 mL 0   valACYclovir (VALTREX) 500 MG tablet Take 1 tablet (500 mg total) by mouth daily. 30 tablet 11   valsartan (DIOVAN) 320 MG tablet Take 1 tablet (320 mg total) by mouth daily. 90 tablet 3   vitamin B-12 (CYANOCOBALAMIN) 1000 MCG tablet Take 5,000 mcg by mouth daily.     No current facility-administered medications for this encounter.    REVIEW OF SYSTEMS: A 10+ POINT REVIEW OF SYSTEMS WAS OBTAINED including neurology, dermatology, psychiatry, cardiac, respiratory, lymph, extremities, GI, GU, musculoskeletal, constitutional, reproductive, HEENT. On the provided form, she reports  ***. She denies *** and any other symptoms.    PHYSICAL EXAM:  vitals were not taken for this visit.  {may need to copy over vitals} Lungs are clear to auscultation bilaterally. Heart has regular rate and rhythm. No palpable cervical, supraclavicular, or axillary adenopathy. Abdomen soft, non-tender, normal bowel sounds. Breast: *** breast with no palpable mass, nipple discharge, or bleeding. *** breast with ***.   KPS = ***  100 - Normal; no complaints; no evidence of disease. 90   - Able to carry on normal activity; minor signs or symptoms of disease. 80   - Normal activity with effort; some signs or symptoms of disease. 25   - Cares for self; unable to carry on normal activity or to do active work. 60   - Requires occasional assistance, but is able to care for most of his personal needs. 50   - Requires considerable assistance and frequent medical care. 26   - Disabled; requires special care and assistance. 80   - Severely disabled; hospital admission is indicated although death not imminent. 79   - Very sick; hospital admission necessary; active supportive treatment necessary. 10   - Moribund; fatal processes progressing rapidly. 0     - Dead  Karnofsky DA, Abelmann Bouse, Craver LS and Burchenal [facility] [phone]) The use of the nitrogen mustards in the palliative treatment of carcinoma: with particular reference to bronchogenic carcinoma Cancer 1 634-56  LABORATORY DATA:  Lab Results  Component Value Date   WBC 4.3 03/29/2016   HGB 13.6 03/29/2016   HCT 40.5 03/29/2016   MCV 83.0 03/29/2016   PLT 272 03/29/2016   Lab Results  Component Value Date   NA 143 10/27/2021   K 4.0 10/27/2021   CL 105 10/27/2021   CO2 25 10/27/2021   Lab Results  Component Value Date   ALT 16 03/29/2016   AST 19 03/29/2016   ALKPHOS 71 03/29/2016   BILITOT 0.3 03/29/2016    PULMONARY FUNCTION TEST:   Review Flowsheet        No data to display  RADIOGRAPHY: MM LT BREAST BX W LOC DEV  1ST LESION IMAGE BX SPEC STEREO GUIDE  Addendum Date: 08/24/2022   ADDENDUM REPORT: 08/24/2022 08:37 ADDENDUM: Pathology revealed DUCTAL CARCINOMA IN SITU, INTERMEDIATE NUCLEAR GRADE, NECROSIS: PRESENT, CALCIFICATIONS: PRESENT of the LEFT breast, upper outer quadrant, (ribbon clip). This was found to be concordant by Dr. Kristopher Oppenheim. Pathology results were discussed with the patient by telephone. The patient reported doing well after the biopsy with tenderness at the site. Post biopsy instructions and care were reviewed and questions were answered. The patient was encouraged to call [facility] for any additional concerns. My direct phone number was provided. The patient was referred to [facility] on September 05, 2022, per patient request. Pathology results reported by Terie Purser, RN on 08/23/2022. Electronically Signed   By: Kristopher Oppenheim M.D.   On: 08/24/2022 08:37   Result Date: 08/24/2022 CLINICAL DATA:  61 year old female with indeterminate left breast mass and left breast calcifications. EXAM: LEFT BREAST ULTRASOUND-GUIDED CYST ASPIRATION LEFT BREAST STEREOTACTIC CORE NEEDLE BIOPSY COMPARISON:  Previous exam(s). FINDINGS: The patient and I discussed the procedure of image guided biopsy/aspiration including benefits and alternatives. We discussed the high likelihood of a successful procedure. We discussed the risks of the procedure including infection, bleeding, tissue injury, clip migration, and inadequate sampling. Informed written consent was given. The usual time out protocol was performed immediately prior to the procedure. At the time of preprocedural scanning. The hypoechoic mass at the 2 o'clock position 7 cm from the nipple looked very anechoic. As local anesthesia was applied, aspiration of the mass was attempted under ultrasound guidance. The mass aspirated to completion, consistent with a  benign simple cyst. Therefore, biopsy was not performed. Using sterile technique, 1% lidocaine, under direct ultrasound visualization, needle aspiration of the left breast cyst at the 2 o'clock position 7 cm from the nipple was performed. Aspirated fluid has a benign appearance so was discarded. Using sterile technique and 1% Lidocaine as local anesthetic, under stereotactic guidance, a 9 gauge vacuum assisted device was used to perform core needle biopsy of calcifications in the upper-outer quadrant of the left breast using a superior approach. Specimen radiograph was performed showing grouped calcifications within several specimens. Specimens with calcifications are identified for pathology. Lesion quadrant: Upper outer quadrant At the conclusion of the procedure, a ribbon shaped tissue marker clip was deployed into the biopsy cavity. Follow-up 2-view mammogram was performed and dictated separately. IMPRESSION: Ultrasound-guided aspiration of the left breast. Stereotactic-guided biopsy of the left breast. No apparent complications. Electronically Signed: By: Kristopher Oppenheim M.D. On: 08/21/2022 08:42  US BREAST ASPIRATION LEFT  Addendum Date: 08/24/2022   ADDENDUM REPORT: 08/24/2022 08:37 ADDENDUM: Pathology revealed DUCTAL CARCINOMA IN SITU, INTERMEDIATE NUCLEAR GRADE, NECROSIS: PRESENT, CALCIFICATIONS: PRESENT of the LEFT breast, upper outer quadrant, (ribbon clip). This was found to be concordant by Dr. Kristopher Oppenheim. Pathology results were discussed with the patient by telephone. The patient reported doing well after the biopsy with tenderness at the site. Post biopsy instructions and care were reviewed and questions were answered. The patient was encouraged to call [facility] for any additional concerns. My direct phone number was provided. The patient was referred to [facility] on September 05, 2022, per  patient request. Pathology results reported by Terie Purser, RN on 08/23/2022. Electronically Signed   By:  Kristopher Oppenheim M.D.   On: 08/24/2022 08:37   Result Date: 08/24/2022 CLINICAL DATA:  61 year old female with indeterminate left breast mass and left breast calcifications. EXAM: LEFT BREAST ULTRASOUND-GUIDED CYST ASPIRATION LEFT BREAST STEREOTACTIC CORE NEEDLE BIOPSY COMPARISON:  Previous exam(s). FINDINGS: The patient and I discussed the procedure of image guided biopsy/aspiration including benefits and alternatives. We discussed the high likelihood of a successful procedure. We discussed the risks of the procedure including infection, bleeding, tissue injury, clip migration, and inadequate sampling. Informed written consent was given. The usual time out protocol was performed immediately prior to the procedure. At the time of preprocedural scanning. The hypoechoic mass at the 2 o'clock position 7 cm from the nipple looked very anechoic. As local anesthesia was applied, aspiration of the mass was attempted under ultrasound guidance. The mass aspirated to completion, consistent with a benign simple cyst. Therefore, biopsy was not performed. Using sterile technique, 1% lidocaine, under direct ultrasound visualization, needle aspiration of the left breast cyst at the 2 o'clock position 7 cm from the nipple was performed. Aspirated fluid has a benign appearance so was discarded. Using sterile technique and 1% Lidocaine as local anesthetic, under stereotactic guidance, a 9 gauge vacuum assisted device was used to perform core needle biopsy of calcifications in the upper-outer quadrant of the left breast using a superior approach. Specimen radiograph was performed showing grouped calcifications within several specimens. Specimens with calcifications are identified for pathology. Lesion quadrant: Upper outer quadrant At the conclusion of the procedure, a ribbon shaped tissue marker clip was deployed into the biopsy  cavity. Follow-up 2-view mammogram was performed and dictated separately. IMPRESSION: Ultrasound-guided aspiration of the left breast. Stereotactic-guided biopsy of the left breast. No apparent complications. Electronically Signed: By: Kristopher Oppenheim M.D. On: 08/21/2022 08:42   MM CLIP PLACEMENT LEFT  Result Date: 08/21/2022 CLINICAL DATA:  Status post left breast stereotactic biopsy and cyst aspiration. EXAM: 3D DIAGNOSTIC LEFT MAMMOGRAM POST STEREOTACTIC BIOPSY COMPARISON:  Previous exam(s). FINDINGS: 3D Mammographic images were obtained following stereotactic guided biopsy of the upper-outer left breast. The biopsy marking clip is in expected position at the site of biopsy. Previously noted circumscribed mass in the far posterolateral left breast has also resolved after the cyst aspiration. IMPRESSION: Appropriate positioning of the ribbon shaped biopsy marking clip at the site of biopsy in the upper-outer left breast. Final Assessment: Post Procedure Mammograms for Marker Placement Electronically Signed   By: Kristopher Oppenheim M.D.   On: 08/21/2022 08:47  MM Digital Diagnostic Unilat L  Result Date: 08/13/2022 CLINICAL DATA:  Screening recall for left breast calcifications. EXAM: DIGITAL DIAGNOSTIC UNILATERAL LEFT MAMMOGRAM WITH CAD; ULTRASOUND LEFT BREAST LIMITED TECHNIQUE: Left digital diagnostic mammography was performed. ; Targeted ultrasound examination of the left breast was performed. COMPARISON:  Previous exam(s). ACR Breast Density Category c: The breast tissue is heterogeneously dense, which may obscure small masses. FINDINGS: There is a small group of punctate and coarse heterogeneous calcifications in the upper outer left breast, spanning 8 mm, with no associated mass or distortion and no linearity or branching. There is also a oval mass inferior left axilla/far lateral breast, consistent with a lymph node, that appears increased in size prior exams. Targeted left breast ultrasound is performed,  showing an oval hypoechoic circumscribed mass in the left breast at 2 o'clock, 7 cm the nipple, measuring 1.2 x 0.8 x 1.1 cm. There is no hilar fat or convincing hilar blood flow. Ultrasound evaluation the left axilla shows several normal lymph  nodes. There are no enlarged or abnormal left axillary lymph nodes. IMPRESSION: 1. Indeterminate left breast calcifications in the upper outer quadrant. Tissue sampling is recommended. 2. 1.2 cm hypoechoic mass versus an abnormal intramammary lymph node, at 2 o'clock, 7 cm the nipple. Tissue sampling is recommended. RECOMMENDATION: 1. Stereotactic core needle biopsy 8 mm group of calcifications in the upper outer left breast. 2. Ultrasound-guided core needle biopsy of the mass/abnormal lymph node in the left breast at 2 o'clock, 7 cm the nipple. These procedures were scheduled prior to the patient being discharged from the [facility]. I have discussed the findings and recommendations with the patient. If applicable, a reminder letter will be sent to the patient regarding the next appointment. BI-RADS CATEGORY  4: Suspicious. Electronically Signed   By: Lajean Manes M.D.   On: 08/13/2022 09:03  US BREAST LTD UNI LEFT INC AXILLA  Result Date: 08/13/2022 CLINICAL DATA:  Screening recall for left breast calcifications. EXAM: DIGITAL DIAGNOSTIC UNILATERAL LEFT MAMMOGRAM WITH CAD; ULTRASOUND LEFT BREAST LIMITED TECHNIQUE: Left digital diagnostic mammography was performed. ; Targeted ultrasound examination of the left breast was performed. COMPARISON:  Previous exam(s). ACR Breast Density Category c: The breast tissue is heterogeneously dense, which may obscure small masses. FINDINGS: There is a small group of punctate and coarse heterogeneous calcifications in the upper outer left breast, spanning 8 mm, with no associated mass or distortion and no linearity or branching. There is also a oval mass inferior left axilla/far lateral breast, consistent with a lymph node, that  appears increased in size prior exams. Targeted left breast ultrasound is performed, showing an oval hypoechoic circumscribed mass in the left breast at 2 o'clock, 7 cm the nipple, measuring 1.2 x 0.8 x 1.1 cm. There is no hilar fat or convincing hilar blood flow. Ultrasound evaluation the left axilla shows several normal lymph nodes. There are no enlarged or abnormal left axillary lymph nodes. IMPRESSION: 1. Indeterminate left breast calcifications in the upper outer quadrant. Tissue sampling is recommended. 2. 1.2 cm hypoechoic mass versus an abnormal intramammary lymph node, at 2 o'clock, 7 cm the nipple. Tissue sampling is recommended. RECOMMENDATION: 1. Stereotactic core needle biopsy 8 mm group of calcifications in the upper outer left breast. 2. Ultrasound-guided core needle biopsy of the mass/abnormal lymph node in the left breast at 2 o'clock, 7 cm the nipple. These procedures were scheduled prior to the patient being discharged from the [facility]. I have discussed the findings and recommendations with the patient. If applicable, a reminder letter will be sent to the patient regarding the next appointment. BI-RADS CATEGORY  4: Suspicious. Electronically Signed   By: Lajean Manes M.D.   On: 08/13/2022 09:03     IMPRESSION: ***   Patient will be a good candidate for breast conservation with radiotherapy to the left breast. We discussed the general course of radiation, potential side effects, and toxicities with radiation and the patient is interested in this approach. ***   PLAN:  ***   ------------------------------------------------  Blair Promise, PhD, MD  This document serves as a record of services personally performed by Gery Pray, MD. It was created on his behalf by Roney Mans, a trained medical scribe. The creation of this record is based on the scribe's personal observations and the provider's statements to them. This document has been checked and approved by the attending  provider.

## 2022-09-05 ENCOUNTER — Ambulatory Visit: Payer: No Typology Code available for payment source | Admitting: Radiation Oncology

## 2022-09-05 ENCOUNTER — Inpatient Hospital Stay: Payer: No Typology Code available for payment source

## 2022-09-05 ENCOUNTER — Ambulatory Visit
Admission: RE | Admit: 2022-09-05 | Discharge: 2022-09-05 | Disposition: A | Discharge status: Home / Self Care | Payer: No Typology Code available for payment source | Source: Ambulatory Visit | Attending: Radiation Oncology | Admitting: Radiation Oncology

## 2022-09-05 ENCOUNTER — Encounter: Payer: Self-pay | Admitting: Genetic Counselor

## 2022-09-05 ENCOUNTER — Inpatient Hospital Stay: Payer: No Typology Code available for payment source | Admitting: Licensed Clinical Social Worker

## 2022-09-05 ENCOUNTER — Ambulatory Visit: Payer: Self-pay | Admitting: Surgery

## 2022-09-05 ENCOUNTER — Ambulatory Visit: Payer: No Typology Code available for payment source | Admitting: Physical Therapy

## 2022-09-05 ENCOUNTER — Inpatient Hospital Stay (HOSPITAL_BASED_OUTPATIENT_CLINIC_OR_DEPARTMENT_OTHER): Payer: No Typology Code available for payment source | Admitting: Genetic Counselor

## 2022-09-05 ENCOUNTER — Inpatient Hospital Stay
Payer: No Typology Code available for payment source | Attending: Hematology and Oncology | Admitting: Hematology and Oncology

## 2022-09-05 ENCOUNTER — Encounter: Payer: Self-pay | Admitting: Hematology and Oncology

## 2022-09-05 DIAGNOSIS — K219 Gastro-esophageal reflux disease without esophagitis: Secondary | ICD-10-CM | POA: Diagnosis not present

## 2022-09-05 DIAGNOSIS — D0512 Intraductal carcinoma in situ of left breast: Secondary | ICD-10-CM | POA: Diagnosis present

## 2022-09-05 DIAGNOSIS — Z85828 Personal history of other malignant neoplasm of skin: Secondary | ICD-10-CM

## 2022-09-05 DIAGNOSIS — Z170 Estrogen receptor positive status [ER+]: Secondary | ICD-10-CM | POA: Insufficient documentation

## 2022-09-05 DIAGNOSIS — I2510 Atherosclerotic heart disease of native coronary artery without angina pectoris: Secondary | ICD-10-CM | POA: Insufficient documentation

## 2022-09-05 DIAGNOSIS — E785 Hyperlipidemia, unspecified: Secondary | ICD-10-CM | POA: Insufficient documentation

## 2022-09-05 DIAGNOSIS — G8929 Other chronic pain: Secondary | ICD-10-CM | POA: Insufficient documentation

## 2022-09-05 DIAGNOSIS — Z79624 Long term (current) use of inhibitors of nucleotide synthesis: Secondary | ICD-10-CM | POA: Diagnosis not present

## 2022-09-05 DIAGNOSIS — Z8049 Family history of malignant neoplasm of other genital organs: Secondary | ICD-10-CM

## 2022-09-05 DIAGNOSIS — E119 Type 2 diabetes mellitus without complications: Secondary | ICD-10-CM | POA: Diagnosis not present

## 2022-09-05 DIAGNOSIS — D573 Sickle-cell trait: Secondary | ICD-10-CM | POA: Diagnosis not present

## 2022-09-05 DIAGNOSIS — K5090 Crohn's disease, unspecified, without complications: Secondary | ICD-10-CM | POA: Insufficient documentation

## 2022-09-05 DIAGNOSIS — I341 Nonrheumatic mitral (valve) prolapse: Secondary | ICD-10-CM | POA: Insufficient documentation

## 2022-09-05 DIAGNOSIS — Z7982 Long term (current) use of aspirin: Secondary | ICD-10-CM | POA: Insufficient documentation

## 2022-09-05 DIAGNOSIS — Z79899 Other long term (current) drug therapy: Secondary | ICD-10-CM | POA: Insufficient documentation

## 2022-09-05 DIAGNOSIS — M329 Systemic lupus erythematosus, unspecified: Secondary | ICD-10-CM | POA: Insufficient documentation

## 2022-09-05 LAB — CBC WITH DIFFERENTIAL (CANCER CENTER ONLY)
Abs Immature Granulocytes: 0.02 10*3/uL (ref 0.00–0.07)
Basophils Absolute: 0 10*3/uL (ref 0.0–0.1)
Basophils Relative: 1 %
Eosinophils Absolute: 0.1 10*3/uL (ref 0.0–0.5)
Eosinophils Relative: 1 %
HCT: 39.2 % (ref 36.0–46.0)
Hemoglobin: 13 g/dL (ref 12.0–15.0)
Immature Granulocytes: 0 %
Lymphocytes Relative: 49 %
Lymphs Abs: 2.9 10*3/uL (ref 0.7–4.0)
MCH: 27.8 pg (ref 26.0–34.0)
MCHC: 33.2 g/dL (ref 30.0–36.0)
MCV: 83.9 fL (ref 80.0–100.0)
Monocytes Absolute: 0.3 10*3/uL (ref 0.1–1.0)
Monocytes Relative: 6 %
Neutro Abs: 2.5 10*3/uL (ref 1.7–7.7)
Neutrophils Relative %: 43 %
Platelet Count: 277 10*3/uL (ref 150–400)
RBC: 4.67 MIL/uL (ref 3.87–5.11)
RDW: 13.2 % (ref 11.5–15.5)
WBC Count: 5.8 10*3/uL (ref 4.0–10.5)
nRBC: 0 % (ref 0.0–0.2)

## 2022-09-05 LAB — CMP (CANCER CENTER ONLY)
ALT: 15 U/L (ref 0–44)
AST: 19 U/L (ref 15–41)
Albumin: 4.1 g/dL (ref 3.5–5.0)
Alkaline Phosphatase: 53 U/L (ref 38–126)
Anion gap: 6 (ref 5–15)
BUN: 21 mg/dL — ABNORMAL HIGH (ref 6–20)
CO2: 28 mmol/L (ref 22–32)
Calcium: 10.3 mg/dL (ref 8.9–10.3)
Chloride: 103 mmol/L (ref 98–111)
Creatinine: 1.25 mg/dL — ABNORMAL HIGH (ref 0.44–1.00)
GFR, Estimated: 49 mL/min — ABNORMAL LOW (ref >60)
Glucose, Bld: 107 mg/dL — ABNORMAL HIGH (ref 70–99)
Potassium: 4.1 mmol/L (ref 3.5–5.1)
Sodium: 137 mmol/L (ref 135–145)
Total Bilirubin: 0.4 mg/dL (ref 0.3–1.2)
Total Protein: 7.9 g/dL (ref 6.5–8.1)

## 2022-09-05 LAB — GENETIC SCREENING ORDER

## 2022-09-05 NOTE — H&P (Signed)
Subjective  Breast [facility] 09/05/22 [place]/ [place]  Chief Complaint: Breast Cancer     History of Present Illness: Vickie Lane is a 61 y.o. female who is seen today as an office consultation at the request of Dr. Chryl Heck for evaluation of Breast Cancer .   This is a 61 year old female with lupus, chronic kidney disease stage II, sickle cell trait, osteoarthritis, mild coronary artery disease, hypercholesterolemia who presents after routine screening mammogram revealed a group of suspicious calcifications in the upper outer quadrant of the left breast Further work-up revealed an area of microcalcifications measuring 8 mm in the upper otuer quadrant.  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  Patient denies any previous breast problems or breast surgery.  No family history of breast cancer.  She presents to breast [place] for her initial evaluation.  Review of Systems: A complete review of systems was obtained from the patient.  I have reviewed this information and discussed as appropriate with the patient.  See HPI as well for other ROS.  Review of Systems  Constitutional: Negative.   HENT: Negative.    Eyes: Negative.   Respiratory: Negative.    Cardiovascular: Negative.   Gastrointestinal:  Positive for heartburn.  Genitourinary: Negative.   Musculoskeletal:  Positive for back pain.  Skin: Negative.   Neurological: Negative.   Endo/Heme/Allergies: Negative.   Psychiatric/Behavioral: Negative.        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)    Past Surgical History:  Procedure Laterality Date   POSTERIOR FUSION LUMBAR SPINE N/A 11/07/2012   CESAREAN SECTION N/A    1991, 1993   HYSTERECTOMY N/A    Abdominal Hysterectomy 1997   Polypectomy     Date Unknown   Wrist Fracture Surgery     2008     Allergies  Allergen Reactions   Ciprofloxacin Anaphylaxis and Other (See Comments)   Codeine Anaphylaxis, Nausea and Nausea And Vomiting    "dry heaves" (11/07/2012)  Other reaction(s): stomach upset   Hydrocodone Nausea    "dry heaves" (11/07/2012)   Levofloxacin Anaphylaxis and Rash    Other reaction(s): anaphylaxis   Amlodipine Swelling    Other reaction(s): anaphylaxis   Hydrocodone-Acetaminophen Nausea And Vomiting and Rash    Other reaction(s): stomach upset    Current Outpatient Medications on File Prior to Visit  Medication Sig Dispense Refill   aspirin 81 MG EC tablet Take 81 mg by mouth every morning     b complex vitamins capsule Take 1 capsule by mouth once daily     carvediloL (COREG) 6.25 MG tablet Take 6.25 mg by mouth 2 (two) times daily     cetirizine (ZYRTEC) 10 MG tablet Take 10 mg by mouth once daily     chlorthalidone 25 MG tablet Take 1 tablet by mouth once daily     cholecalciferol (VITAMIN D3) 1000 unit capsule Take 1,000 Units  by mouth once daily     cyanocobalamin (VITAMIN B12) 1000 MCG tablet Take 1,000 mcg by mouth once daily     esomeprazole (NEXIUM) 20 mg packet Take 20 mg by mouth every morning before breakfast     estradioL (CLIMARA) 0.1 mg/24 hr patch Place 1 patch onto the skin once a week     multivitamin with iron-minerals (SUPER THERA VITE M) tablet Take 1 tablet by mouth once daily     simvastatin (ZOCOR) 40 MG tablet Take 40 mg by mouth at bedtime     spironolactone (ALDACTONE) 25 MG tablet Take 1 tablet by mouth once daily     valACYclovir (VALTREX)  500 MG tablet Take 500 mg by mouth once daily     valsartan (DIOVAN) 320 MG tablet Take by mouth at bedtime     meloxicam (MOBIC) 7.5 MG tablet Take 7.5 mg by mouth once daily as needed for Pain     No current facility-administered medications on file prior to visit.    Family History  Problem Relation Age of Onset   High blood pressure (Hypertension) Mother    High blood pressure (Hypertension) Father    Coronary Artery Disease (Blocked arteries around heart) Father    Breast cancer Paternal Aunt      Social History   Tobacco Use  Smoking Status Never  Smokeless Tobacco Never     Social History   Socioeconomic History   Marital status: Single  Tobacco Use   Smoking status: Never   Smokeless tobacco: Never  Vaping Use   Vaping Use: Never used  Substance and Sexual Activity   Alcohol use: Yes   Drug use: Never    Objective:      Physical Exam   Constitutional:  WDWN in NAD, conversant, no obvious deformities; lying in bed comfortably Eyes:  Pupils equal, round; sclera anicteric; moist conjunctiva; no lid lag HENT:  Oral mucosa moist; good dentition  Neck:  No masses palpated, trachea midline; no thyromegaly Lungs:  CTA bilaterally; normal respiratory effort Breasts:  symmetric, no nipple changes; no palpable masses or lymphadenopathy on either side; slight bruising from biopsy in LUOQ CV:  Regular rate and rhythm; no murmurs; extremities well-perfused with no edema Abd:  +bowel sounds, soft, non-tender, no palpable organomegaly; no palpable hernias Musc:  Normal gait; no apparent clubbing or cyanosis in extremities Lymphatic:  No palpable cervical or axillary lymphadenopathy Skin:  Warm, dry; no sign of jaundice Psychiatric - alert and oriented x 4; calm mood and affect   Labs, Imaging and Diagnostic Testing:  Diagnosis Breast, left, needle core biopsy, UOQ depth - DUCTAL CARCINOMA IN SITU, INTERMEDIATE NUCLEAR GRADE - NECROSIS: PRESENT - CALCIFICATIONS:  PRESENT - DCIS LENGTH: 0.7 CM  PROGNOSTIC INDICATORS Results: IMMUNOHISTOCHEMICAL AND MORPHOMETRIC ANALYSIS PERFORMED MANUALLY Estrogen Receptor: 95%, POSITIVE, STRONG STAINING INTENSITY Progesterone Receptor: 40%, POSITIVE, STRONG STAINING INTENSITY REFERENCE RANGE ESTROGEN RECEPTOR NEGATIVE 0% POSITIVE =>1% REFERENCE RANGE PROGESTERONE RECEPTOR NEGATIVE 0% POSITIVE =>1% All controls stained appropriately Casimer Lanius MD Pathologist, Electronic Signature ( Signed 08/24/2022)  CLINICAL DATA:  Screening.   EXAM: DIGITAL SCREENING BILATERAL MAMMOGRAM WITH TOMOSYNTHESIS AND CAD   TECHNIQUE: Bilateral screening digital craniocaudal and mediolateral oblique mammograms were obtained. Bilateral screening digital breast tomosynthesis was performed. The images were evaluated with computer-aided detection.   COMPARISON:  Previous exam(s).   ACR Breast Density Category c: The breast tissue is heterogeneously dense, which may obscure small masses.   FINDINGS: In the left breast, calcifications warrant further evaluation.  In the right breast, no findings suspicious for malignancy.   IMPRESSION: Further evaluation is suggested for calcifications in the left breast.   RECOMMENDATION: Diagnostic mammogram of the left breast. (Code:FI-L-74M)   The patient will be contacted regarding the findings, and additional imaging will be scheduled.   BI-RADS CATEGORY  0: Incomplete. Need additional imaging evaluation and/or prior mammograms for comparison.     Electronically Signed   By: Zerita Boers M.D.   On: 07/30/2022 13:00  CLINICAL DATA:  Screening recall for left breast calcifications.   EXAM: DIGITAL DIAGNOSTIC UNILATERAL LEFT MAMMOGRAM WITH CAD; ULTRASOUND LEFT BREAST LIMITED   TECHNIQUE: Left digital diagnostic mammography was performed. ; Targeted ultrasound examination of the left breast was performed.   COMPARISON:  Previous exam(s).   ACR Breast Density Category  c: The breast tissue is heterogeneously dense, which may obscure small masses.   FINDINGS: There is a small group of punctate and coarse heterogeneous calcifications in the upper outer left breast, spanning 8 mm, with no associated mass or distortion and no linearity or branching.   There is also a oval mass inferior left axilla/far lateral breast, consistent with a lymph node, that appears increased in size prior exams.   Targeted left breast ultrasound is performed, showing an oval hypoechoic circumscribed mass in the left breast at 2 o'clock, 7 cm the nipple, measuring 1.2 x 0.8 x 1.1 cm. There is no hilar fat or convincing hilar blood flow. Ultrasound evaluation the left axilla shows several normal lymph nodes. There are no enlarged or abnormal left axillary lymph nodes.   IMPRESSION: 1. Indeterminate left breast calcifications in the upper outer quadrant. Tissue sampling is recommended. 2. 1.2 cm hypoechoic mass versus an abnormal intramammary lymph node, at 2 o'clock, 7 cm the nipple. Tissue sampling is recommended.   RECOMMENDATION: 1. Stereotactic core needle biopsy 8 mm group of calcifications in the upper outer left breast. 2. Ultrasound-guided core needle biopsy of the mass/abnormal lymph node in the left breast at 2 o'clock, 7 cm the nipple.   These procedures were scheduled prior to the patient being discharged from the [facility].   I have discussed the findings and recommendations with the patient. If applicable, a reminder letter will be sent to the patient regarding the next appointment.   BI-RADS CATEGORY  4: Suspicious.     Electronically Signed   By: Lajean Manes M.D.   On: 08/13/2022 09:03  Assessment and Plan:  Diagnoses and all orders for this visit:  Ductal carcinoma in situ (DCIS) of left breast    We discussed her case at the breast multidisciplinary clinic.  I spent some time with the patient and her boyfriend discussing her treatment  options.  She has an early stage low-grade fairly small area of DCIS.  She is a good candidate for breast conserving therapy.  We will proceed with a left breast radioactive seed localized lumpectomy.  We will inject mag trace at the time of surgery in the event that she has any invasive cancer.  This would facilitate a later sentinel lymph node biopsy.The surgical procedure has been discussed with the patient.  Potential risks, benefits, alternative treatments, and expected outcomes have been explained.  All of the patient's questions at this time have been answered.  The likelihood of reaching the patient's treatment goal is good.  The patient understand the proposed surgical procedure and wishes to proceed.   No follow-ups on file.  Carlean Jews, MD  09/05/2022 11:21  AM

## 2022-09-05 NOTE — H&P (View-Only) (Signed)
Subjective  Breast MDC 09/05/22 [place]/ [place]  Chief Complaint: Breast Cancer     History of Present Illness: Vickie Lane is a 61 y.o. female who is seen today as an office consultation at the request of Dr. Chryl Heck for evaluation of Breast Cancer .   This is a 61 year old female with lupus, chronic kidney disease stage II, sickle cell trait, osteoarthritis, mild coronary artery disease, hypercholesterolemia who presents after routine screening mammogram revealed a group of suspicious calcifications in the upper outer quadrant of the left breast Further work-up revealed an area of microcalcifications measuring 8 mm in the upper otuer quadrant.  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  Patient denies any previous breast problems or breast surgery.  No family history of breast cancer.  She presents to breast [place] for her initial evaluation.  Review of Systems: A complete review of systems was obtained from the patient.  I have reviewed this information and discussed as appropriate with the patient.  See HPI as well for other ROS.  Review of Systems  Constitutional: Negative.   HENT: Negative.    Eyes: Negative.   Respiratory: Negative.    Cardiovascular: Negative.   Gastrointestinal:  Positive for heartburn.  Genitourinary: Negative.   Musculoskeletal:  Positive for back pain.  Skin: Negative.   Neurological: Negative.   Endo/Heme/Allergies: Negative.   Psychiatric/Behavioral: Negative.        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)    Past Surgical History:  Procedure Laterality Date   POSTERIOR FUSION LUMBAR SPINE N/A 11/07/2012   CESAREAN SECTION N/A    1991, 1993   HYSTERECTOMY N/A    Abdominal Hysterectomy 1997   Polypectomy     Date Unknown   Wrist Fracture Surgery     2008     Allergies  Allergen Reactions   Ciprofloxacin Anaphylaxis and Other (See Comments)   Codeine Anaphylaxis, Nausea and Nausea And Vomiting    "dry heaves" (11/07/2012)  Other reaction(s): stomach upset   Hydrocodone Nausea    "dry heaves" (11/07/2012)   Levofloxacin Anaphylaxis and Rash    Other reaction(s): anaphylaxis   Amlodipine Swelling    Other reaction(s): anaphylaxis   Hydrocodone-Acetaminophen Nausea And Vomiting and Rash    Other reaction(s): stomach upset    Current Outpatient Medications on File Prior to Visit  Medication Sig Dispense Refill   aspirin 81 MG EC tablet Take 81 mg by mouth every morning     b complex vitamins capsule Take 1 capsule by mouth once daily     carvediloL (COREG) 6.25 MG tablet Take 6.25 mg by mouth 2 (two) times daily     cetirizine (ZYRTEC) 10 MG tablet Take 10 mg by mouth once daily     chlorthalidone 25 MG tablet Take 1 tablet by mouth once daily     cholecalciferol (VITAMIN D3) 1000 unit capsule Take 1,000 Units  by mouth once daily     cyanocobalamin (VITAMIN B12) 1000 MCG tablet Take 1,000 mcg by mouth once daily     esomeprazole (NEXIUM) 20 mg packet Take 20 mg by mouth every morning before breakfast     estradioL (CLIMARA) 0.1 mg/24 hr patch Place 1 patch onto the skin once a week     multivitamin with iron-minerals (SUPER THERA VITE M) tablet Take 1 tablet by mouth once daily     simvastatin (ZOCOR) 40 MG tablet Take 40 mg by mouth at bedtime     spironolactone (ALDACTONE) 25 MG tablet Take 1 tablet by mouth once daily     valACYclovir (VALTREX)  500 MG tablet Take 500 mg by mouth once daily     valsartan (DIOVAN) 320 MG tablet Take by mouth at bedtime     meloxicam (MOBIC) 7.5 MG tablet Take 7.5 mg by mouth once daily as needed for Pain     No current facility-administered medications on file prior to visit.    Family History  Problem Relation Age of Onset   High blood pressure (Hypertension) Mother    High blood pressure (Hypertension) Father    Coronary Artery Disease (Blocked arteries around heart) Father    Breast cancer Paternal Aunt      Social History   Tobacco Use  Smoking Status Never  Smokeless Tobacco Never     Social History   Socioeconomic History   Marital status: Single  Tobacco Use   Smoking status: Never   Smokeless tobacco: Never  Vaping Use   Vaping Use: Never used  Substance and Sexual Activity   Alcohol use: Yes   Drug use: Never    Objective:      Physical Exam   Constitutional:  WDWN in NAD, conversant, no obvious deformities; lying in bed comfortably Eyes:  Pupils equal, round; sclera anicteric; moist conjunctiva; no lid lag HENT:  Oral mucosa moist; good dentition  Neck:  No masses palpated, trachea midline; no thyromegaly Lungs:  CTA bilaterally; normal respiratory effort Breasts:  symmetric, no nipple changes; no palpable masses or lymphadenopathy on either side; slight bruising from biopsy in LUOQ CV:  Regular rate and rhythm; no murmurs; extremities well-perfused with no edema Abd:  +bowel sounds, soft, non-tender, no palpable organomegaly; no palpable hernias Musc:  Normal gait; no apparent clubbing or cyanosis in extremities Lymphatic:  No palpable cervical or axillary lymphadenopathy Skin:  Warm, dry; no sign of jaundice Psychiatric - alert and oriented x 4; calm mood and affect   Labs, Imaging and Diagnostic Testing:  Diagnosis Breast, left, needle core biopsy, UOQ depth - DUCTAL CARCINOMA IN SITU, INTERMEDIATE NUCLEAR GRADE - NECROSIS: PRESENT - CALCIFICATIONS:  PRESENT - DCIS LENGTH: 0.7 CM  PROGNOSTIC INDICATORS Results: IMMUNOHISTOCHEMICAL AND MORPHOMETRIC ANALYSIS PERFORMED MANUALLY Estrogen Receptor: 95%, POSITIVE, STRONG STAINING INTENSITY Progesterone Receptor: 40%, POSITIVE, STRONG STAINING INTENSITY REFERENCE RANGE ESTROGEN RECEPTOR NEGATIVE 0% POSITIVE =>1% REFERENCE RANGE PROGESTERONE RECEPTOR NEGATIVE 0% POSITIVE =>1% All controls stained appropriately Casimer Lanius MD Pathologist, Electronic Signature ( Signed 08/24/2022)  CLINICAL DATA:  Screening.   EXAM: DIGITAL SCREENING BILATERAL MAMMOGRAM WITH TOMOSYNTHESIS AND CAD   TECHNIQUE: Bilateral screening digital craniocaudal and mediolateral oblique mammograms were obtained. Bilateral screening digital breast tomosynthesis was performed. The images were evaluated with computer-aided detection.   COMPARISON:  Previous exam(s).   ACR Breast Density Category c: The breast tissue is heterogeneously dense, which may obscure small masses.   FINDINGS: In the left breast, calcifications warrant further evaluation.  In the right breast, no findings suspicious for malignancy.   IMPRESSION: Further evaluation is suggested for calcifications in the left breast.   RECOMMENDATION: Diagnostic mammogram of the left breast. (Code:FI-L-60M)   The patient will be contacted regarding the findings, and additional imaging will be scheduled.   BI-RADS CATEGORY  0: Incomplete. Need additional imaging evaluation and/or prior mammograms for comparison.     Electronically Signed   By: Zerita Boers M.D.   On: 07/30/2022 13:00  CLINICAL DATA:  Screening recall for left breast calcifications.   EXAM: DIGITAL DIAGNOSTIC UNILATERAL LEFT MAMMOGRAM WITH CAD; ULTRASOUND LEFT BREAST LIMITED   TECHNIQUE: Left digital diagnostic mammography was performed. ; Targeted ultrasound examination of the left breast was performed.   COMPARISON:  Previous exam(s).   ACR Breast Density Category  c: The breast tissue is heterogeneously dense, which may obscure small masses.   FINDINGS: There is a small group of punctate and coarse heterogeneous calcifications in the upper outer left breast, spanning 8 mm, with no associated mass or distortion and no linearity or branching.   There is also a oval mass inferior left axilla/far lateral breast, consistent with a lymph node, that appears increased in size prior exams.   Targeted left breast ultrasound is performed, showing an oval hypoechoic circumscribed mass in the left breast at 2 o'clock, 7 cm the nipple, measuring 1.2 x 0.8 x 1.1 cm. There is no hilar fat or convincing hilar blood flow. Ultrasound evaluation the left axilla shows several normal lymph nodes. There are no enlarged or abnormal left axillary lymph nodes.   IMPRESSION: 1. Indeterminate left breast calcifications in the upper outer quadrant. Tissue sampling is recommended. 2. 1.2 cm hypoechoic mass versus an abnormal intramammary lymph node, at 2 o'clock, 7 cm the nipple. Tissue sampling is recommended.   RECOMMENDATION: 1. Stereotactic core needle biopsy 8 mm group of calcifications in the upper outer left breast. 2. Ultrasound-guided core needle biopsy of the mass/abnormal lymph node in the left breast at 2 o'clock, 7 cm the nipple.   These procedures were scheduled prior to the patient being discharged from the [facility].   I have discussed the findings and recommendations with the patient. If applicable, a reminder letter will be sent to the patient regarding the next appointment.   BI-RADS CATEGORY  4: Suspicious.     Electronically Signed   By: Lajean Manes M.D.   On: 08/13/2022 09:03  Assessment and Plan:  Diagnoses and all orders for this visit:  Ductal carcinoma in situ (DCIS) of left breast    We discussed her case at the breast multidisciplinary clinic.  I spent some time with the patient and her boyfriend discussing her treatment  options.  She has an early stage low-grade fairly small area of DCIS.  She is a good candidate for breast conserving therapy.  We will proceed with a left breast radioactive seed localized lumpectomy.  We will inject mag trace at the time of surgery in the event that she has any invasive cancer.  This would facilitate a later sentinel lymph node biopsy.The surgical procedure has been discussed with the patient.  Potential risks, benefits, alternative treatments, and expected outcomes have been explained.  All of the patient's questions at this time have been answered.  The likelihood of reaching the patient's treatment goal is good.  The patient understand the proposed surgical procedure and wishes to proceed.   No follow-ups on file.  Carlean Jews, MD  09/05/2022 11:21  AM

## 2022-09-05 NOTE — Research (Signed)
MTG-015 - Tissue and Bodily Fluids: Translational Medicine: Discovery and Evaluation of Biomarkers/Pharmacogenomics for the Diagnosis and Personalized Management of Patients   Patient Vickie Lane was identified by Dr Chryl Heck as a potential candidate for the above listed study. This Clinical Research Nurse met with [name], [id number], on 09/05/22 in a manner and location that ensures patient privacy to discuss participation in the above listed research study. Patient is accompanied by her boyfriend. A copy of the informed consent document and separate HIPAA Authorization was provided to the patient. Patient reads, speaks, and understands [place].    Patient was provided with the business card of this Nurse and encouraged to contact the research team with any questions.  Approximately 15 minutes were spent with the patient reviewing the informed consent documents. Patient was provided the option of taking informed consent documents home to review and was encouraged to review at their convenience with their support network, including other care providers. Patient took the consent documents home to review.  Patient agreed to a follow-up call from the research team on Monday, 10/23.  [name], RN, BSN, CPN Clinical Research Nurse I [phone]  09/05/2022 11:07 AM

## 2022-09-05 NOTE — Assessment & Plan Note (Signed)
This is a very pleasant 61 year old female patient with newly diagnosed left breast DCIS, ER/PR positive referred to breast [place] for recommendations.  We have discussed the following details about DCIS.  Pathology review: I discussed with the patient the difference between DCIS and invasive breast cancer. It is considered a precancerous lesion. DCIS is classified as a Stage 0 breast cancer. It is generally detected through mammograms as calcifications. We discussed the significance of grades and its impact on prognosis. We also discussed the importance of ER and PR receptors and their implications to adjuvant treatment options. Prognosis of DCIS dependence on grade and degree of comedo necrosis. It is anticipated that if not treated, 20-30% of DCIS can develop into invasive breast cancer.  Recommendation: 1. Breast conserving surgery or mastectomy in this case if she is not considered a great candidate for radiation 2. Followed by antiestrogen therapy with tamoxifen/aromatase inhibitors based on menopausal status 5 years  Tamoxifen counseling: We discussed the risks and benefits of tamoxifen. These include but not limited to insomnia, hot flashes, mood changes, vaginal dryness, and weight gain. Although rare, serious side effects including endometrial cancer, risk of blood clots were also discussed. We strongly believe that the benefits far outweigh the risks. Patient understands these risks and consented to starting treatment. Planned treatment duration is 5 years.  Aromatase inhibitors counseling: We have discussed the mechanism of action of aromatase inhibitors today.  We have discussed adverse effects including but not limited to menopausal symptoms, increased risk of osteoporosis and fractures, cardiovascular events, arthralgias and myalgias.  We do believe that the benefits far outweigh the risks.  Plan treatment duration of 5 years.  We have discussed about available options for antiestrogen  therapy.  We have discussed about discontinuing estrogen supplementation immediately and following up with her gynecologist as soon as possible.  She may be able to tolerate tamoxifen better given her use of hormone replacement therapy for a long time versus aromatase inhibitors.  We have however focused on both options and she would like to think about this.  She will return to clinic after surgery to review final pathology and to discuss additional recommendations.

## 2022-09-05 NOTE — Progress Notes (Signed)
REFERRING PROVIDER: Benay Pike, MD [place],  [place] [id number]   PRIMARY PROVIDER:  Kelton Pillar, MD  PRIMARY REASON FOR VISIT:  1. Ductal carcinoma in situ (DCIS) of left breast   2. Personal history of skin cancer   3. Family history of uterine cancer      HISTORY OF PRESENT ILLNESS:   [name], a 61 y.o. female, was seen for a [facility] cancer genetics consultation during the breast multidisciplinary clinic at the request of Dr. Chryl Heck due to a personal history of ductal carcinoma in situ.  [name] presents to clinic today to discuss the possibility of a hereditary predisposition to cancer, to discuss genetic testing, and to further clarify her future cancer risks, as well as potential cancer risks for family members.   In October 2023, at the age of 35, 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 The preliminary treatment plan is pending.  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   CANCER HISTORY:  Oncology History  Ductal carcinoma in situ (DCIS) of left breast  07/27/2022 Mammogram   Bilateral screening mammogram showed left breast calcifications warranting further evaluation.  No findings suspicious for malignancy in the right breast.  Diagnostic mammogram of the left breast confirmed 1.2 x 0.8 x 1.1 cm hypoechoic circumscribed mass in the left breast at 2:00, no hilar fat or convincing hilar blood flow.  Ultrasound showed several normal lymph nodes.  No enlarged or abnormal left axillary lymph nodes.   08/21/2022 Pathology Results   Pathology from the left breast needle core biopsy showed intermediate nuclear grade DCIS, necrosis present, calcs present, ER 95% positive strong staining PR 40% positive strong staining   08/31/2022 Initial Diagnosis   Ductal carcinoma in situ (DCIS) of left breast       Past Medical History:   Diagnosis Date   Allergy    Arthritis    "back" (11/07/2012)   Breast cancer ([place])    Chest pain    Chronic lower back pain    Complication of anesthesia    somthing made her itch   Constipation    GERD (gastroesophageal reflux disease)    Heart murmur    "MVP" (`11/07/2012)   History of blood transfusion 1990's   "? w/one of my deliveries" (11/07/2012)   Hyperlipidemia    LDL   Hypertension 04/03/2010   Joint pain    Kidney problem    Lactose intolerance    Mitral valve prolapse    Seizures ([place]) 1991   "related to pregnancy; I had HELLP" (11/07/2012)- lastseizure was 1991 per pt    Sickle cell trait ([place])    Systemic lupus erythematosus ([place]) 04/03/2010   joint pain   Type II diabetes mellitus ([place]) 08/2011   Diet and exercise.    Past Surgical History:  Procedure Laterality Date   ABDOMINAL HYSTERECTOMY  1997   CARPAL TUNNEL RELEASE  2008   "right w/fracture OR" (11/07/2012)   [place]  11/07/2012   "L4-5" (11/07/2012)   TRIGGER FINGER RELEASE Right 05/04/2016   Procedure: MINOR RELEASE TRIGGER FINGER/A-1 PULLEY;  Surgeon: Charlotte Crumb, MD;  Location: [facility];  Service: Orthopedics;  Laterality: Right;   WRIST FRACTURE SURGERY  2008   "right" (11/07/2012)  FAMILY HISTORY:  We obtained a detailed, 4-generation family history.  Significant diagnoses are listed below: Family History  Problem Relation Age of Onset   Cancer Paternal Aunt        GYN (uterine/cervical); d. mid 61s     [name] is unaware of previous family history of genetic testing for hereditary cancer risks. There is no reported Ashkenazi Jewish ancestry. There is no known consanguinity.  GENETIC COUNSELING ASSESSMENT: [name] is a 61 y.o. female with a personal history of cancer which is somewhat suggestive of a hereditary cancer syndrome given her age of diagnosis.    DISCUSSION: We discussed that 5 - 10% of cancer is hereditary, with most cases of hereditary breast cancer associated with mutations in BRCA1/2.  There are other genes that can be associated with hereditary breast cancer syndromes. We discussed that testing is beneficial for several reasons including knowing how to follow individuals after completing their treatment, identifying whether potential treatment options would be beneficial, and understanding if other family members could be at risk for cancer and allowing them to undergo genetic testing.   We discussed that [name] does not meet NCCN criteria for genetic testing.  However, we discussed that it is reasonable to have genetic testing given her diagnosis before age 85 and an possibly uninformative family history (given no female first degree relatives beyond her mother).  [name] was interested in proceeding with genetic testing.   We reviewed the characteristics, features and inheritance patterns of hereditary cancer syndromes. We also discussed genetic testing, including the appropriate family members to test, the process of testing, insurance coverage and turn-around-time for results. We discussed the implications of a negative, positive and/or variant of uncertain significant result. In order to get genetic test results in a timely manner so that [name] can use these genetic test results for surgical decisions, we recommended [name] pursue genetic testing for the [facility] BRCAPlus Panel. Once complete, we recommend [name] pursue reflex genetic testing to a more comprehensive gene panel.   The CustomNext-Cancer+RNAinsight panel offered by [facility] includes sequencing and rearrangement analysis for the following 50 genes:  APC, ATM, AXIN2, BARD1, BAP1, BMPR1A, BRCA1, BRCA2, BRIP1, CDH1, CDK4, CDKN2A, CHEK2, DICER1, EPCAM, GREM1, HOXB13, MEN1, MLH1, MSH2, MSH3, MSH6, MITF, MUTYH, NBN, NF1, NF2, NTHL1,  PALB2, PMS2, POLD1, POLE, PTEN, POT1, RAD51C, RAD51D, RECQL, RET, SDHA, SDHAF2, SDHB, SDHC, SDHD, SMAD4, SMARCA4, STK11, TP53, TSC1, TSC2, and VHL.  RNA data is routinely analyzed for use in variant interpretation for all genes.  We discussed that if her out of pocket cost for testing is over $100, the laboratory should contact them to discuss self-pay prices, patient pay assistance programs, if applicable, and other billing options.   PLAN: After considering the risks, benefits, and limitations, [name] provided informed consent to pursue genetic testing and the blood sample was sent to [facility] for analysis of the BRCAPlus and CustomNext-Cancer +RNAinsight Panel. Results should be available within approximately 1-2 weeks' time, at which point they will be disclosed by telephone to [name], as will any additional recommendations warranted by these results. [name] will receive a summary of her genetic counseling visit and a copy of her results once available. This information will also be available in Epic.   [name] questions were answered to her satisfaction today. Our contact information was provided should additional questions or concerns arise. Thank you for the referral and allowing [place] to share in the care of your  patient.   Vickie Lane M. Vickie Lane, Vickie Lane, [facility] Genetic Counselor [email or web address] (P) [phone]  The patient was seen for a total of 13 minutes in face-to-face genetic counseling. The patient was seen with her partner, Juanda Crumble.  Dr. Chryl Heck was available to discuss this case as needed.  _______________________________________________________________________ For Office Staff:  Number of people involved in session: 2 Was an Intern/ student involved with case: no

## 2022-09-05 NOTE — Progress Notes (Signed)
[place] NOTE  Patient Care Team: Kelton Pillar, MD as PCP - General (Family Medicine) Josue Hector, MD as PCP - Cardiology (Cardiology) Rockwell Germany, RN as Oncology Nurse Navigator Mauro Kaufmann, RN as Oncology Nurse Navigator Donnie Mesa, MD as Consulting Physician (General Surgery) Benay Pike, MD as Consulting Physician (Hematology and Oncology) Gery Pray, MD as Consulting Physician (Radiation Oncology)  CHIEF COMPLAINTS/PURPOSE OF CONSULTATION:  Newly diagnosed breast cancer  HISTORY OF PRESENTING ILLNESS:  Vickie Lane 61 y.o. female is here because of recent diagnosis of left breast DCIS  I reviewed her records extensively and collaborated the history with the patient.  SUMMARY OF ONCOLOGIC HISTORY: Oncology History  Ductal carcinoma in situ (DCIS) of left breast  07/27/2022 Mammogram   Bilateral screening mammogram showed left breast calcifications warranting further evaluation.  No findings suspicious for malignancy in the right breast.  Diagnostic mammogram of the left breast confirmed 1.2 x 0.8 x 1.1 cm hypoechoic circumscribed mass in the left breast at 2:00, no hilar fat or convincing hilar blood flow.  Ultrasound showed several normal lymph nodes.  No enlarged or abnormal left axillary lymph nodes.   08/21/2022 Pathology Results   Pathology from the left breast needle core biopsy showed intermediate nuclear grade DCIS, necrosis present, calcs present, ER 95% positive strong staining PR 40% positive strong staining   08/31/2022 Initial Diagnosis   Ductal carcinoma in situ (DCIS) of left breast    Ms. Lane described to the appointment with her family today.  At baseline she has lupus but has not really taken any medication except for remote history of Plaquenil.  She otherwise has high blood pressure, diabetes, dyslipidemia, coronary artery disease, sickle cell trait. She is also on estrogen patch and has  been on it for almost 20 years.  About 20 years ago she had a hysterectomy, no oophorectomy and apparently had severe hot flashes and hence was started on estrogen supplementation.  Rest of the pertinent 10 point ROS reviewed and negative  MEDICAL HISTORY:  Past Medical History:  Diagnosis Date   Allergy    Arthritis    "back" (11/07/2012)   Breast cancer ([place])    Chest pain    Chronic lower back pain    Complication of anesthesia    somthing made her itch   Constipation    GERD (gastroesophageal reflux disease)    Heart murmur    "MVP" (`11/07/2012)   History of blood transfusion 1990's   "? w/one of my deliveries" (11/07/2012)   Hyperlipidemia    LDL   Hypertension 04/03/2010   Joint pain    Kidney problem    Lactose intolerance    Mitral valve prolapse    Seizures ([place]) 1991   "related to pregnancy; I had HELLP" (11/07/2012)- lastseizure was 1991 per pt    Sickle cell trait ([place])    Systemic lupus erythematosus ([place]) 04/03/2010   joint pain   Type II diabetes mellitus ([place]) 08/2011   Diet and exercise.    SURGICAL HISTORY: Past Surgical History:  Procedure Laterality Date   ABDOMINAL HYSTERECTOMY  1997   CARPAL TUNNEL RELEASE  2008   "right w/fracture OR" (11/07/2012)   [place]  11/07/2012   "L4-5" (11/07/2012)   TRIGGER FINGER RELEASE Right 05/04/2016   Procedure: MINOR RELEASE TRIGGER FINGER/A-1 PULLEY;  Surgeon: Charlotte Crumb, MD;  Location:  [facility];  Service: Orthopedics;  Laterality: Right;   WRIST FRACTURE SURGERY  2008   "right" (11/07/2012)    SOCIAL HISTORY: Social History   Socioeconomic History   Marital status: Single    Spouse name: Not on file   Number of children: Not on file   Years of education: Not on file   Highest education level: Not on file  Occupational History   Occupation: Lawyer: [employer]  Tobacco Use    Smoking status: Never   Smokeless tobacco: Never  Vaping Use   Vaping Use: Never used  Substance and Sexual Activity   Alcohol use: Yes    Comment: 11/07/2012 "once/month might have a glass of wine"   Drug use: No   Sexual activity: Yes    Birth control/protection: Surgical  Other Topics Concern   Not on file  Social History Narrative   Not on file   Social Determinants of Health   Financial Resource Strain: Low Risk  (10/03/2021)   Overall Financial Resource Strain (CARDIA)    Difficulty of Paying Living Expenses: Not hard at all  Food Insecurity: No Food Insecurity (10/03/2021)   Hunger Vital Sign    Worried About Running Out of Food in the Last Year: Never true    [place] in the Last Year: Never true  Transportation Needs: No Transportation Needs (10/03/2021)   PRAPARE - Hydrologist (Medical): No    Lack of Transportation (Non-Medical): No  Physical Activity: Sufficiently Active (10/03/2021)   Exercise Vital Sign    Days of Exercise per Week: 4 days    Minutes of Exercise per Session: 50 min  Stress: Not on file  Social Connections: Not on file  Intimate Partner Violence: Not on file    FAMILY HISTORY: Family History  Problem Relation Age of Onset   Hypertension Mother    Hypertension Father    Heart attack Father    Sudden death Father    Breast cancer Paternal Aunt    Heart disease Other    Hypertension Other    Colon cancer Neg Hx    Colon polyps Neg Hx    Rectal cancer Neg Hx    Stomach cancer Neg Hx    Esophageal cancer Neg Hx     ALLERGIES:  is allergic to amlodipine, amlodipine besylate, ciprofloxacin, codeine, hydrocodone, levofloxacin, ultram [tramadol hcl], and hydrocodone-acetaminophen.  MEDICATIONS:  Current Outpatient Medications  Medication Sig Dispense Refill   aspirin EC 81 MG tablet Take 81 mg by mouth every morning.     b complex vitamins capsule Take 1 capsule by mouth daily.     carvedilol (COREG)  6.25 MG tablet Take 1 tablet (6.25 mg total) by mouth 2 (two) times daily. 180 tablet 3   Cetirizine-Pseudoephedrine (ZYRTEC-D PO) Take 1 tablet by mouth daily as needed. For allergies     chlorthalidone (HYGROTON) 25 MG tablet Take 1 tablet (25 mg total) by mouth daily. 90 tablet 3   Cholecalciferol (VITAMIN D3 PO) Take 125 mcg by mouth daily.     esomeprazole (NEXIUM) 20 MG packet Take 20 mg by mouth as needed.     estradiol (CLIMARA) 0.1 mg/24hr patch Place 1 patch (0.1 mg total) onto the skin once a week. 4 patch 12   FREESTYLE LITE test strip   11   Lancets (FREESTYLE) lancets   12   Multiple Vitamin (MULTIVITAMIN WITH MINERALS) TABS Take 1  tablet by mouth daily.     simvastatin (ZOCOR) 40 MG tablet Take 1 tablet (40 mg total) by mouth at bedtime. 90 tablet 3   spironolactone (ALDACTONE) 25 MG tablet Take 1 tablet by mouth once a day 90 tablet 0   valACYclovir (VALTREX) 500 MG tablet Take 1 tablet (500 mg total) by mouth daily. 30 tablet 11   valsartan (DIOVAN) 320 MG tablet Take 1 tablet (320 mg total) by mouth daily. 90 tablet 3   vitamin B-12 (CYANOCOBALAMIN) 1000 MCG tablet Take 5,000 mcg by mouth daily.     COVID-19 mRNA bivalent vaccine, Pfizer, (PFIZER COVID-19 VAC BIVALENT) injection Inject into the muscle. 0.3 mL 0   meloxicam (MOBIC) 7.5 MG tablet Take 7.5 mg by mouth daily as needed for pain.     tirzepatide (MOUNJARO) 5 MG/0.5ML Pen Inject 5 mg into the skin once a week. 2 mL 0   No current facility-administered medications for this visit.    REVIEW OF SYSTEMS:   Constitutional: Denies fevers, chills or abnormal night sweats Eyes: Denies blurriness of vision, double vision or watery eyes Ears, nose, mouth, throat, and face: Denies mucositis or sore throat Respiratory: Denies cough, dyspnea or wheezes Cardiovascular: Denies palpitation, chest discomfort or lower extremity swelling Gastrointestinal:  Denies nausea, heartburn or change in bowel habits Skin: Denies abnormal  skin rashes Lymphatics: Denies new lymphadenopathy or easy bruising Neurological:Denies numbness, tingling or new weaknesses Behavioral/Psych: Mood is stable, no new changes  Breast: Denies any palpable lumps or discharge All other systems were reviewed with the patient and are negative.  PHYSICAL EXAMINATION: ECOG PERFORMANCE STATUS: 0 - Asymptomatic  Vitals:   09/05/22 0901  BP: 128/77  Pulse: 71  Resp: 16  Temp: 97.9 F (36.6 C)  SpO2: 100%   Filed Weights   09/05/22 0901  Weight: 211 lb 3.2 oz (95.8 kg)    GENERAL:alert, no distress and comfortable SKIN: skin color, texture, turgor are normal, no rashes or significant lesions EYES: normal, conjunctiva are pink and non-injected, sclera clear OROPHARYNX:no exudate, no erythema and lips, buccal mucosa, and tongue normal  NECK: supple, thyroid normal size, non-tender, without nodularity LYMPH:  no palpable lymphadenopathy in the cervical, axillary LUNGS: clear to auscultation and percussion with normal breathing effort HEART: regular rate & rhythm and no murmurs and no lower extremity edema ABDOMEN:abdomen soft, non-tender and normal bowel sounds Musculoskeletal:no cyanosis of digits and no clubbing  PSYCH: alert & oriented x 3 with fluent speech NEURO: no focal motor/sensory deficits BREAST: Postbiopsy changes noted.  No other palpable masses or regional adenopathy.  LABORATORY DATA:  I have reviewed the data as listed Lab Results  Component Value Date   WBC 5.8 09/05/2022   HGB 13.0 09/05/2022   HCT 39.2 09/05/2022   MCV 83.9 09/05/2022   PLT 277 09/05/2022   Lab Results  Component Value Date   NA 137 09/05/2022   K 4.1 09/05/2022   CL 103 09/05/2022   CO2 28 09/05/2022    RADIOGRAPHIC STUDIES: I have personally reviewed the radiological reports and agreed with the findings in the report.  ASSESSMENT AND PLAN:  Ductal carcinoma in situ (DCIS) of left breast This is a very pleasant 61 year old female  patient with newly diagnosed left breast DCIS, ER/PR positive referred to breast [place] for recommendations.  We have discussed the following details about DCIS.  Pathology review: I discussed with the patient the difference between DCIS and invasive breast cancer. It is considered a precancerous lesion. DCIS  is classified as a Stage 0 breast cancer. It is generally detected through mammograms as calcifications. We discussed the significance of grades and its impact on prognosis. We also discussed the importance of ER and PR receptors and their implications to adjuvant treatment options. Prognosis of DCIS dependence on grade and degree of comedo necrosis. It is anticipated that if not treated, 20-30% of DCIS can develop into invasive breast cancer.  Recommendation: 1. Breast conserving surgery or mastectomy in this case if she is not considered a great candidate for radiation 2. Followed by antiestrogen therapy with tamoxifen/aromatase inhibitors based on menopausal status 5 years  Tamoxifen counseling: We discussed the risks and benefits of tamoxifen. These include but not limited to insomnia, hot flashes, mood changes, vaginal dryness, and weight gain. Although rare, serious side effects including endometrial cancer, risk of blood clots were also discussed. We strongly believe that the benefits far outweigh the risks. Patient understands these risks and consented to starting treatment. Planned treatment duration is 5 years.  Aromatase inhibitors counseling: We have discussed the mechanism of action of aromatase inhibitors today.  We have discussed adverse effects including but not limited to menopausal symptoms, increased risk of osteoporosis and fractures, cardiovascular events, arthralgias and myalgias.  We do believe that the benefits far outweigh the risks.  Plan treatment duration of 5 years.  We have discussed about available options for antiestrogen therapy.  We have discussed about discontinuing  estrogen supplementation immediately and following up with her gynecologist as soon as possible.  She may be able to tolerate tamoxifen better given her use of hormone replacement therapy for a long time versus aromatase inhibitors.  We have however focused on both options and she would like to think about this.  She will return to clinic after surgery to review final pathology and to discuss additional recommendations.   All questions were answered. The patient knows to call the clinic with any problems, questions or concerns.    Benay Pike, MD 09/05/22

## 2022-09-05 NOTE — Progress Notes (Signed)
[place] [facility]  Initial Assessment   Vickie Lane is a 61 y.o. year old female accompanied by partner, Vickie Lane. Clinical Social Work was referred by  [facility]  for assessment of psychosocial needs.   SDOH (Social Determinants of Health) assessments performed: Yes SDOH Interventions    Flowsheet Row Clinical Support from 09/05/2022 in [facility]  SDOH Interventions   Food Insecurity Interventions Intervention Not Indicated  Housing Interventions Intervention Not Indicated  Transportation Interventions Intervention Not Indicated  Utilities Interventions Intervention Not Indicated  Financial Strain Interventions Intervention Not Indicated       SDOH Screenings   Food Insecurity: No Food Insecurity (09/05/2022)  Housing: Low Risk  (09/05/2022)  Transportation Needs: No Transportation Needs (09/05/2022)  Utilities: Not At Risk (09/05/2022)  Alcohol Screen: Low Risk  (10/03/2021)  Depression (PHQ2-9): Low Risk  (09/06/2021)  Financial Resource Strain: Low Risk  (09/05/2022)  Physical Activity: Sufficiently Active (10/03/2021)  Tobacco Use: Low Risk  (09/05/2022)     Distress Screen completed: Yes    09/05/2022   11:23 AM  ONCBCN DISTRESS SCREENING  Screening Type Initial Screening  Distress experienced in past week (1-10) 2  Emotional problem type Adjusting to illness  Physical Problem type Pain      Family/Social Information:  Housing Arrangement: patient lives alone. Partner lives nearby as do two adult sons Family members/support persons in your life? Family and Colleagues Transportation concerns: no  Employment: Working full time as Science writer for [employer].  Income source: Employment Financial concerns: No Type of concern: None Food access concerns: no Religious or spiritual practice:  Services Currently in place:  n/a  Coping/ Adjustment to diagnosis: Patient understands treatment plan and what  happens next? Yes. Understands the plan. Does have a trip to [place] in December that she is hoping to work her treatment plan around Concerns about diagnosis and/or treatment:  general adjustment Patient reported stressors: Adjusting to my illness Hopes and/or priorities: be able to go on her trip in December Patient enjoys exercise, time with family/ friends, and travel Current coping skills/ strengths: Capable of independent living , Armed forces logistics/support/administrative officer , Motivation for treatment/growth , Special hobby/interest , and Supportive family/friends     SUMMARY: Current SDOH Barriers:  None noted today  Clinical Social Work Clinical Goal(s):  No clinical social work goals at this time  Interventions: Discussed common feeling and emotions when being diagnosed with cancer, and the importance of support during treatment Informed patient of the support team roles and support services at [facility] Provided [place] contact information and encouraged patient to call with any questions or concerns   Follow Up Plan: Patient will contact CSW with any support or resource needs Patient verbalizes understanding of plan: Yes    Keena Dinse E Altagracia Rone, LCSW

## 2022-09-06 ENCOUNTER — Other Ambulatory Visit: Payer: Self-pay | Admitting: *Deleted

## 2022-09-06 ENCOUNTER — Other Ambulatory Visit: Payer: Self-pay | Admitting: Surgery

## 2022-09-06 DIAGNOSIS — D0512 Intraductal carcinoma in situ of left breast: Secondary | ICD-10-CM

## 2022-09-07 ENCOUNTER — Telehealth: Payer: Self-pay | Admitting: Radiation Oncology

## 2022-09-07 NOTE — Telephone Encounter (Signed)
LVM on home and mobile # to schedule CON/SIM post-sx with Dr. Sondra Come.

## 2022-09-10 ENCOUNTER — Encounter: Payer: Self-pay | Admitting: *Deleted

## 2022-09-10 ENCOUNTER — Telehealth: Payer: Self-pay

## 2022-09-10 ENCOUNTER — Encounter (HOSPITAL_BASED_OUTPATIENT_CLINIC_OR_DEPARTMENT_OTHER): Payer: Self-pay | Admitting: Surgery

## 2022-09-10 NOTE — Telephone Encounter (Signed)
MTG-015 - Tissue and Bodily Fluids: Translational Medicine: Discovery and Evaluation of Biomarkers/Pharmacogenomics for the Diagnosis and Personalized Management of Patients   Called and spoke with patient to follow-up on interest in above-listed study. Patient stated she would like to participate; agreed to come in to [facility] for consent and labs on Wednesday, 10/25. Appointments scheduled. Patient thanked for her time and consideration and encouraged to call with any questions or concerns.  [name], RN, BSN, CPN Clinical Research Nurse I [phone]  09/10/2022 10:05 AM

## 2022-09-11 ENCOUNTER — Telehealth: Payer: Self-pay | Admitting: Genetic Counselor

## 2022-09-11 ENCOUNTER — Encounter: Payer: Self-pay | Admitting: Genetic Counselor

## 2022-09-11 DIAGNOSIS — Z1379 Encounter for other screening for genetic and chromosomal anomalies: Secondary | ICD-10-CM | POA: Insufficient documentation

## 2022-09-11 NOTE — Telephone Encounter (Signed)
Revealed negative BRCAPlus Panel.  Pan-cancer panel is pending.    

## 2022-09-12 ENCOUNTER — Inpatient Hospital Stay: Payer: No Typology Code available for payment source

## 2022-09-12 DIAGNOSIS — D0512 Intraductal carcinoma in situ of left breast: Secondary | ICD-10-CM

## 2022-09-12 LAB — RESEARCH LABS

## 2022-09-12 NOTE — Research (Signed)
MTG-015 - Tissue and Bodily Fluids: Translational Medicine: Discovery and Evaluation of Biomarkers/Pharmacogenomics for the Diagnosis and Personalized Management of Patients   CONSENT: Patient Vickie Lane was identified by Dr Chryl Heck as a potential candidate for the above listed study. This Clinical Research Nurse met with Vickie Lane, [id number] on 09/12/22 in a manner and location that ensures patient privacy to discuss participation in the above listed research study. Patient is Unaccompanied. Patient was previously provided with informed consent documents. Patient confirmed they have read the informed consent documents.  As outlined in the informed consent form, this Nurse and Vickie Lane discussed the purpose of the research study, the investigational nature of the study, study procedures and requirements for study participation, potential risks and benefits of study participation, as well as alternatives to participation. This study is not blinded or double-blinded. The patient understands participation is voluntary and they may withdraw from study participation at any time. This study does not involve randomization.  This study does not involve an investigational drug or device. This study does not involve a placebo. Patient understands enrollment is pending full eligibility review.   Confidentiality and how the patient's information will be used as part of study participation were discussed.  Patient was informed there is reimbursement provided for their time and effort spent on trial participation.  The patient is encouraged to discuss research study participation with their insurance provider to determine what costs they may incur as part of study participation, including research related injury.    All questions were answered to patient's satisfaction. The informed consent and separate HIPAA Authorization was reviewed page by page. The patient's  mental and emotional status is appropriate to provide informed consent, and the patient verbalizes an understanding of study participation. Patient has agreed to participate in the above listed research study and has voluntarily signed the informed consent (IRB approved 10/09/21) and separate HIPAA Authorization, version 5 (revised 11/04/19; IRB approved 10/09/21)  on 09/12/22 at 1340PM. The patient was provided with a copy of the signed informed consent form and separate HIPAA Authorization for their reference. No study specific procedures were obtained prior to the signing of the informed consent document. Approximately 10 minutes were spent with the patient reviewing the informed consent documents. Patient was not requested to complete a Release of Information form.  ELIGIBILITY: This Nurse has reviewed this patient's inclusion and exclusion criteria and confirmed Vickie Lane is eligible for study participation. Patient will continue with enrollment.  Menopausal status (women only): Vickie Lane is post menopausal s/p hysterectomy with LMP in 1997.  Eligibility confirmed by treating investigator, who also agrees that patient should proceed with enrollment.  DATA COLLECTION: The following social, medical, and family history was collected from the patient on 09/12/22.  Date and time of last meal prior to blood collection: 09/12/22 @ 1200PM  Does the patient drink alcohol? Occasionally; <1 drink per week  Does the patient use tobacco products? No; never smoker  Does the patient have a personal history of cancer? Yes   If yes, what type and dates of diagnosis/treatment: Skin; treated with surgery only in 10/20.  Does the patient have family history of cancer in immediate family (grandparents, parents, and/or siblings)? No    Has the patient received a COVID-19 vaccination? Yes   If yes, vaccine manufacturer? [place]  Number of doses received? 3  Date of last dose?  12/21  Has the patient ever tested positive for COVID? No   Date of  last COVID test taken? 8/22  Has the patient received any other vaccines in the past year? Yes   If yes, which vaccines and dates? Flu (09/10/22)  The patient's medication list was reviewed with the patient and it was correct and start dates were verified. Has the patient stopped any medications in the past 30 days? Yes; patient reported that she stopped using estradiol patches last week.  BLOOD COLLECTION: Blood specimen was collected using fresh venipuncture.   GIFT CARD: The patient was given a $50 visa gift card for her participation in today's research activities.  PLAN: Patient thanked for her time and participation in above-listed research study. Patient has been provided direct contact information and is encouraged to contact research for any further questions or concerns. Patient aware to expect follow-up in 12-24 months.  Vickie Penna, RN, BSN, CPN Clinical Research Nurse I [phone]  09/12/2022 2:15 PM

## 2022-09-12 NOTE — Research (Signed)
MTG-015 - Tissue and Bodily Fluids: Translational Medicine: Discovery and Evaluation of Biomarkers/Pharmacogenomics for the Diagnosis and Personalized Management of Patients    This Nurse has reviewed this patient's inclusion and exclusion criteria as a second review and confirms Vickie Lane is eligible for study participation.  Patient may continue with enrollment.  Marjie Skiff Johnie Makki, RN, BSN, [facility]  Her  [facility] Clinical Research Nurse [place] [phone]  Pager [phone] 09/12/2022 3:28 PM

## 2022-09-13 ENCOUNTER — Telehealth: Payer: Self-pay | Admitting: *Deleted

## 2022-09-13 ENCOUNTER — Other Ambulatory Visit (HOSPITAL_COMMUNITY): Payer: Self-pay

## 2022-09-13 ENCOUNTER — Encounter: Payer: Self-pay | Admitting: *Deleted

## 2022-09-13 NOTE — Telephone Encounter (Signed)
Spoke to pt concerning [facility] from 09/05/22. Denies questions or concerns regarding dx or treatment care plan. Encourage pt to call with needs. Received verbal understanding.

## 2022-09-14 ENCOUNTER — Ambulatory Visit
Admission: RE | Admit: 2022-09-14 | Discharge: 2022-09-14 | Disposition: A | Discharge status: Home / Self Care | Payer: No Typology Code available for payment source | Source: Ambulatory Visit | Attending: Surgery | Admitting: Surgery

## 2022-09-14 ENCOUNTER — Other Ambulatory Visit: Payer: Self-pay

## 2022-09-14 ENCOUNTER — Encounter (HOSPITAL_BASED_OUTPATIENT_CLINIC_OR_DEPARTMENT_OTHER)
Admission: RE | Admit: 2022-09-14 | Discharge: 2022-09-14 | Disposition: A | Discharge status: Home / Self Care | Payer: No Typology Code available for payment source | Source: Ambulatory Visit | Attending: Surgery | Admitting: Surgery

## 2022-09-14 DIAGNOSIS — D0512 Intraductal carcinoma in situ of left breast: Secondary | ICD-10-CM | POA: Diagnosis not present

## 2022-09-14 NOTE — Progress Notes (Signed)

## 2022-09-18 ENCOUNTER — Ambulatory Visit (HOSPITAL_BASED_OUTPATIENT_CLINIC_OR_DEPARTMENT_OTHER): Payer: No Typology Code available for payment source | Admitting: Anesthesiology

## 2022-09-18 ENCOUNTER — Ambulatory Visit
Admission: RE | Admit: 2022-09-18 | Discharge: 2022-09-18 | Disposition: A | Discharge status: Home / Self Care | Payer: No Typology Code available for payment source | Source: Ambulatory Visit | Attending: Surgery | Admitting: Surgery

## 2022-09-18 ENCOUNTER — Encounter (HOSPITAL_BASED_OUTPATIENT_CLINIC_OR_DEPARTMENT_OTHER): Payer: Self-pay | Admitting: Surgery

## 2022-09-18 ENCOUNTER — Encounter (HOSPITAL_BASED_OUTPATIENT_CLINIC_OR_DEPARTMENT_OTHER)
Admission: RE | Disposition: A | Discharge status: Home / Self Care | Payer: Self-pay | Source: Home / Self Care | Attending: Surgery

## 2022-09-18 ENCOUNTER — Ambulatory Visit (HOSPITAL_BASED_OUTPATIENT_CLINIC_OR_DEPARTMENT_OTHER)
Admission: RE | Admit: 2022-09-18 | Discharge: 2022-09-18 | Disposition: A | Discharge status: Home / Self Care | Payer: No Typology Code available for payment source | Attending: Surgery | Admitting: Surgery

## 2022-09-18 ENCOUNTER — Other Ambulatory Visit: Payer: Self-pay

## 2022-09-18 DIAGNOSIS — I129 Hypertensive chronic kidney disease with stage 1 through stage 4 chronic kidney disease, or unspecified chronic kidney disease: Secondary | ICD-10-CM | POA: Diagnosis not present

## 2022-09-18 DIAGNOSIS — E7800 Pure hypercholesterolemia, unspecified: Secondary | ICD-10-CM | POA: Diagnosis not present

## 2022-09-18 DIAGNOSIS — Z170 Estrogen receptor positive status [ER+]: Secondary | ICD-10-CM | POA: Diagnosis not present

## 2022-09-18 DIAGNOSIS — N182 Chronic kidney disease, stage 2 (mild): Secondary | ICD-10-CM | POA: Insufficient documentation

## 2022-09-18 DIAGNOSIS — E1122 Type 2 diabetes mellitus with diabetic chronic kidney disease: Secondary | ICD-10-CM | POA: Insufficient documentation

## 2022-09-18 DIAGNOSIS — K219 Gastro-esophageal reflux disease without esophagitis: Secondary | ICD-10-CM | POA: Insufficient documentation

## 2022-09-18 DIAGNOSIS — Z01818 Encounter for other preprocedural examination: Secondary | ICD-10-CM

## 2022-09-18 DIAGNOSIS — I2510 Atherosclerotic heart disease of native coronary artery without angina pectoris: Secondary | ICD-10-CM | POA: Diagnosis not present

## 2022-09-18 DIAGNOSIS — D0512 Intraductal carcinoma in situ of left breast: Secondary | ICD-10-CM

## 2022-09-18 DIAGNOSIS — M1990 Unspecified osteoarthritis, unspecified site: Secondary | ICD-10-CM | POA: Diagnosis not present

## 2022-09-18 DIAGNOSIS — D573 Sickle-cell trait: Secondary | ICD-10-CM | POA: Insufficient documentation

## 2022-09-18 DIAGNOSIS — M329 Systemic lupus erythematosus, unspecified: Secondary | ICD-10-CM | POA: Diagnosis not present

## 2022-09-18 HISTORY — PX: BREAST LUMPECTOMY WITH RADIOACTIVE SEED LOCALIZATION: SHX6424

## 2022-09-18 SURGERY — BREAST LUMPECTOMY WITH RADIOACTIVE SEED LOCALIZATION
Anesthesia: General | Site: Breast | Laterality: Left

## 2022-09-18 MED ORDER — PHENYLEPHRINE HCL (PRESSORS) 10 MG/ML IV SOLN
INTRAVENOUS | Status: AC
  Filled 2022-09-18: qty 1

## 2022-09-18 MED ORDER — CEFAZOLIN SODIUM-DEXTROSE 2-4 GM/100ML-% IV SOLN
2.0000 g | INTRAVENOUS | Status: AC
  Administered 2022-09-18: 2 g via INTRAVENOUS

## 2022-09-18 MED ORDER — 0.9 % SODIUM CHLORIDE (POUR BTL) OPTIME
TOPICAL | Status: DC | PRN
  Administered 2022-09-18: 120 mL

## 2022-09-18 MED ORDER — LIDOCAINE 2% (20 MG/ML) 5 ML SYRINGE
INTRAMUSCULAR | Status: DC | PRN
  Administered 2022-09-18: 60 mg via INTRAVENOUS

## 2022-09-18 MED ORDER — CHLORHEXIDINE GLUCONATE CLOTH 2 % EX PADS: 6.0000 | MEDICATED_PAD | Freq: Once | CUTANEOUS | Status: DC

## 2022-09-18 MED ORDER — EPHEDRINE 5 MG/ML INJ
INTRAVENOUS | Status: AC
  Filled 2022-09-18: qty 5

## 2022-09-18 MED ORDER — ONDANSETRON HCL 4 MG/2ML IJ SOLN
INTRAMUSCULAR | Status: DC | PRN
  Administered 2022-09-18: 4 mg via INTRAVENOUS

## 2022-09-18 MED ORDER — ACETAMINOPHEN 500 MG PO TABS
1000.0000 mg | ORAL_TABLET | Freq: Once | ORAL | Status: AC
  Administered 2022-09-18: 1000 mg via ORAL

## 2022-09-18 MED ORDER — FENTANYL CITRATE (PF) 100 MCG/2ML IJ SOLN
INTRAMUSCULAR | Status: DC | PRN
  Administered 2022-09-18 (×2): 50 ug via INTRAVENOUS

## 2022-09-18 MED ORDER — ONDANSETRON HCL 4 MG/2ML IJ SOLN
INTRAMUSCULAR | Status: AC
  Filled 2022-09-18: qty 2

## 2022-09-18 MED ORDER — MIDAZOLAM HCL 2 MG/2ML IJ SOLN
INTRAMUSCULAR | Status: AC
  Filled 2022-09-18: qty 2

## 2022-09-18 MED ORDER — FENTANYL CITRATE (PF) 100 MCG/2ML IJ SOLN: 25.0000 ug | INTRAMUSCULAR | Status: DC | PRN

## 2022-09-18 MED ORDER — DEXAMETHASONE SODIUM PHOSPHATE 4 MG/ML IJ SOLN
INTRAMUSCULAR | Status: DC | PRN
  Administered 2022-09-18: 10 mg via INTRAVENOUS

## 2022-09-18 MED ORDER — FENTANYL CITRATE (PF) 100 MCG/2ML IJ SOLN
INTRAMUSCULAR | Status: AC
  Filled 2022-09-18: qty 2

## 2022-09-18 MED ORDER — PROPOFOL 10 MG/ML IV BOLUS
INTRAVENOUS | Status: AC
  Filled 2022-09-18: qty 20

## 2022-09-18 MED ORDER — MIDAZOLAM HCL 5 MG/5ML IJ SOLN
INTRAMUSCULAR | Status: DC | PRN
  Administered 2022-09-18: 2 mg via INTRAVENOUS

## 2022-09-18 MED ORDER — ACETAMINOPHEN 500 MG PO TABS: 1000.0000 mg | ORAL_TABLET | Freq: Once | ORAL | Status: DC

## 2022-09-18 MED ORDER — PROPOFOL 10 MG/ML IV BOLUS
INTRAVENOUS | Status: DC | PRN
  Administered 2022-09-18: 150 mg via INTRAVENOUS

## 2022-09-18 MED ORDER — LIDOCAINE 2% (20 MG/ML) 5 ML SYRINGE
INTRAMUSCULAR | Status: AC
  Filled 2022-09-18: qty 5

## 2022-09-18 MED ORDER — ACETAMINOPHEN 500 MG PO TABS
ORAL_TABLET | ORAL | Status: AC
  Filled 2022-09-18: qty 2

## 2022-09-18 MED ORDER — LACTATED RINGERS IV SOLN: INTRAVENOUS | Status: DC

## 2022-09-18 MED ORDER — DEXAMETHASONE SODIUM PHOSPHATE 10 MG/ML IJ SOLN
INTRAMUSCULAR | Status: AC
  Filled 2022-09-18: qty 1

## 2022-09-18 MED ORDER — CEFAZOLIN SODIUM-DEXTROSE 2-4 GM/100ML-% IV SOLN
INTRAVENOUS | Status: AC
  Filled 2022-09-18: qty 100

## 2022-09-18 MED ORDER — BUPIVACAINE-EPINEPHRINE 0.25% -1:200000 IJ SOLN
INTRAMUSCULAR | Status: DC | PRN
  Administered 2022-09-18: 10 mL

## 2022-09-18 MED ORDER — EPHEDRINE SULFATE (PRESSORS) 50 MG/ML IJ SOLN
INTRAMUSCULAR | Status: DC | PRN
  Administered 2022-09-18 (×3): 5 mg via INTRAVENOUS

## 2022-09-18 MED ORDER — MAGTRACE LYMPHATIC TRACER
INTRAMUSCULAR | Status: DC | PRN
  Administered 2022-09-18: 2 mL via INTRAMUSCULAR

## 2022-09-18 SURGICAL SUPPLY — 52 items
APL PRP STRL LF DISP 70% ISPRP (MISCELLANEOUS) ×1
APL SKNCLS STERI-STRIP NONHPOA (GAUZE/BANDAGES/DRESSINGS) ×1
APPLIER CLIP 9.375 MED OPEN (MISCELLANEOUS) ×1
APR CLP MED 9.3 20 MLT OPN (MISCELLANEOUS) ×1
BENZOIN TINCTURE PRP APPL 2/3 (GAUZE/BANDAGES/DRESSINGS) ×1 IMPLANT
BLADE HEX COATED 2.75 (ELECTRODE) ×1 IMPLANT
BLADE SURG 15 STRL LF DISP TIS (BLADE) ×1 IMPLANT
BLADE SURG 15 STRL SS (BLADE) ×1
CANISTER SUC SOCK COL 7IN (MISCELLANEOUS) IMPLANT
CANISTER SUCT 1200ML W/VALVE (MISCELLANEOUS) IMPLANT
CHLORAPREP W/TINT 26 (MISCELLANEOUS) ×1 IMPLANT
CLIP APPLIE 9.375 MED OPEN (MISCELLANEOUS) ×1 IMPLANT
COVER BACK TABLE 60X90IN (DRAPES) ×1 IMPLANT
COVER MAYO STAND STRL (DRAPES) ×1 IMPLANT
COVER PROBE W GEL 5X96 (DRAPES) ×1 IMPLANT
DRAPE LAPAROTOMY 100X72 PEDS (DRAPES) ×1 IMPLANT
DRAPE UTILITY XL STRL (DRAPES) ×1 IMPLANT
DRSG TEGADERM 4X4.75 (GAUZE/BANDAGES/DRESSINGS) ×1 IMPLANT
ELECT REM PT RETURN 9FT ADLT (ELECTROSURGICAL) ×1
ELECTRODE REM PT RTRN 9FT ADLT (ELECTROSURGICAL) ×1 IMPLANT
GAUZE SPONGE 4X4 12PLY STRL LF (GAUZE/BANDAGES/DRESSINGS) IMPLANT
GLOVE BIO SURGEON STRL SZ7 (GLOVE) ×1 IMPLANT
GLOVE BIOGEL PI IND STRL 7.0 (GLOVE) IMPLANT
GLOVE BIOGEL PI IND STRL 7.5 (GLOVE) ×1 IMPLANT
GLOVE SURG SS PI 6.5 STRL IVOR (GLOVE) IMPLANT
GOWN STRL REUS W/ TWL LRG LVL3 (GOWN DISPOSABLE) ×2 IMPLANT
GOWN STRL REUS W/TWL LRG LVL3 (GOWN DISPOSABLE) ×2
ILLUMINATOR WAVEGUIDE N/F (MISCELLANEOUS) IMPLANT
KIT MARKER MARGIN INK (KITS) ×1 IMPLANT
LIGHT WAVEGUIDE WIDE FLAT (MISCELLANEOUS) IMPLANT
NDL HYPO 25X1 1.5 SAFETY (NEEDLE) ×1 IMPLANT
NEEDLE HYPO 25X1 1.5 SAFETY (NEEDLE) ×1 IMPLANT
NS IRRIG 1000ML POUR BTL (IV SOLUTION) ×1 IMPLANT
PACK BASIN DAY SURGERY FS (CUSTOM PROCEDURE TRAY) ×1 IMPLANT
PENCIL SMOKE EVACUATOR (MISCELLANEOUS) ×1 IMPLANT
SLEEVE SCD COMPRESS KNEE MED (STOCKING) ×1 IMPLANT
SPIKE FLUID TRANSFER (MISCELLANEOUS) IMPLANT
SPONGE GAUZE 2X2 8PLY STRL LF (GAUZE/BANDAGES/DRESSINGS) IMPLANT
SPONGE T-LAP 18X18 ~~LOC~~+RFID (SPONGE) IMPLANT
SPONGE T-LAP 4X18 ~~LOC~~+RFID (SPONGE) ×1 IMPLANT
STRIP CLOSURE SKIN 1/2X4 (GAUZE/BANDAGES/DRESSINGS) ×1 IMPLANT
SUT MON AB 4-0 PC3 18 (SUTURE) ×1 IMPLANT
SUT SILK 2 0 SH (SUTURE) IMPLANT
SUT VIC AB 3-0 SH 27 (SUTURE) ×2
SUT VIC AB 3-0 SH 27X BRD (SUTURE) ×1 IMPLANT
SYR BULB EAR ULCER 3OZ GRN STR (SYRINGE) IMPLANT
SYR CONTROL 10ML LL (SYRINGE) ×1 IMPLANT
TOWEL GREEN STERILE FF (TOWEL DISPOSABLE) ×1 IMPLANT
TRACER MAGTRACE VIAL (MISCELLANEOUS) IMPLANT
TRAY FAXITRON CT DISP (TRAY / TRAY PROCEDURE) ×1 IMPLANT
TUBE CONNECTING 20X1/4 (TUBING) IMPLANT
YANKAUER SUCT BULB TIP NO VENT (SUCTIONS) IMPLANT

## 2022-09-18 NOTE — Op Note (Signed)
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 Anesthesia:  GEN - LMA Indications:  This is a 61 year old female with lupus, chronic kidney disease stage II, sickle cell trait, osteoarthritis, mild coronary artery disease, hypercholesterolemia who presents after routine screening mammogram revealed a group of suspicious calcifications in the upper outer quadrant of the left breast Further work-up revealed an area of microcalcifications measuring 8 mm in the upper outer quadrant.  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 Description of procedure: The patient is brought to the operating room placed in supine position on the operating room table. After an adequate level of general anesthesia was obtained, her left breast was prepped with ChloraPrep and draped in sterile fashion. A timeout was taken to ensure the proper patient and proper procedure.  We first injected mag trace lateral to the nipple in case any invasive disease is found on final pathology.  We interrogated the breast with the neoprobe.  The radioactive seed was localized in the left upper outer quadrant approximately 4 cm from the center of the nipple.  We made a circumareolar incision around the upper side of the nipple after infiltrating with 0.25% Marcaine. Dissection was carried down in the breast tissue with cautery. We used the neoprobe to guide [place] towards the radioactive seed. We excised an area of tissue around the radioactive seed 1.5 cm in diameter. The specimen was removed and was oriented with a paint kit. Specimen mammogram showed the radioactive seed as well as the biopsy clip within the specimen. This was sent for pathologic examination. There is no residual radioactivity within the biopsy cavity. We inspected carefully for hemostasis. The wound was thoroughly  irrigated.  Clips were used to mark the margins of the biopsy cavity.  The wound was closed with a deep layer of 3-0 Vicryl and a subcuticular layer of 4-0 Monocryl. Benzoin Steri-Strips were applied. The patient was then extubated and brought to the recovery room in stable condition. All sponge, instrument, and needle counts are correct.  Imogene Burn. Georgette Dover, MD, [facility] Surgery  General/ Trauma Surgery  09/18/2022 9:49 AM

## 2022-09-18 NOTE — Discharge Instructions (Addendum)
[facility] Office Phone Number [phone]  BREAST BIOPSY/ PARTIAL MASTECTOMY: POST OP INSTRUCTIONS  Always review your discharge instruction sheet given to you by the facility where your surgery was performed.  IF YOU HAVE DISABILITY OR FAMILY LEAVE FORMS, YOU MUST BRING THEM TO THE OFFICE FOR PROCESSING.  DO NOT GIVE THEM TO YOUR DOCTOR.  A prescription for pain medication may be given to you upon discharge.  Take your pain medication as prescribed, if needed.  If narcotic pain medicine is not needed, then you may take acetaminophen (Tylenol) or ibuprofen (Advil) as needed. May have Tylenol today, 09/18/2022, after 2:00PM. May have Ibuprofen starting at any time.  Take your usually prescribed medications unless otherwise directed If you need a refill on your pain medication, please contact your pharmacy.  They will contact our office to request authorization.  Prescriptions will not be filled after 5pm or on week-ends. You should eat very light the first 24 hours after surgery, such as soup, crackers, pudding, etc.  Resume your normal diet the day after surgery. Most patients will experience some swelling and bruising in the breast.  Ice packs and a good support bra will help.  Swelling and bruising can take several days to resolve.  It is common to experience some constipation if taking pain medication after surgery.  Increasing fluid intake and taking a stool softener will usually help or prevent this problem from occurring.  A mild laxative (Milk of Magnesia or Miralax) should be taken according to package directions if there are no bowel movements after 48 hours. Unless discharge instructions indicate otherwise, you may remove your bandages 24-48 hours after surgery, and you may shower at that time.  You may have steri-strips (small skin tapes) in place directly over the incision.  These strips should be left on the skin for 7-10 days.  If your surgeon used skin glue on the  incision, you may shower in 24 hours.  The glue will flake off over the next 2-3 weeks.  Any sutures or staples will be removed at the office during your follow-up visit. ACTIVITIES:  You may resume regular daily activities (gradually increasing) beginning the next day.  Wearing a good support bra or sports bra minimizes pain and swelling.  You may have sexual intercourse when it is comfortable. You may drive when you no longer are taking prescription pain medication, you can comfortably wear a seatbelt, and you can safely maneuver your car and apply brakes. RETURN TO WORK:  ______________________________________________________________________________________ Dennis Bast should see your doctor in the office for a follow-up appointment approximately two weeks after your surgery.  Your doctor's nurse will typically make your follow-up appointment when she calls you with your pathology report.  Expect your pathology report 2-3 business days after your surgery.  You may call to check if you do not hear from [place] after three days. OTHER INSTRUCTIONS: _______________________________________________________________________________________________ _____________________________________________________________________________________________________________________________________ _____________________________________________________________________________________________________________________________________ _____________________________________________________________________________________________________________________________________  WHEN TO CALL YOUR DOCTOR: Fever over 101.0 Nausea and/or vomiting. Extreme swelling or bruising. Continued bleeding from incision. Increased pain, redness, or drainage from the incision.  The clinic staff is available to answer your questions during regular business hours.  Please don't hesitate to call and ask to speak to one of the nurses for clinical concerns.  If you have a  medical emergency, go to the nearest emergency room or call 911.  A surgeon from [facility] is always on call at the hospital.  For further questions, please visit [email or web address]  Post Anesthesia Home Care Instructions  Activity: Get plenty of rest for the remainder of the day. A responsible individual must stay with you for 24 hours following the procedure.  For the next 24 hours, DO NOT: -Drive a car -Paediatric nurse -Drink alcoholic beverages -Take any medication unless instructed by your physician -Make any legal decisions or sign important papers.  Meals: Start with liquid foods such as gelatin or soup. Progress to regular foods as tolerated. Avoid greasy, spicy, heavy foods. If nausea and/or vomiting occur, drink only clear liquids until the nausea and/or vomiting subsides. Call your physician if vomiting continues.  Special Instructions/Symptoms: Your throat may feel dry or sore from the anesthesia or the breathing tube placed in your throat during surgery. If this causes discomfort, gargle with warm salt water. The discomfort should disappear within 24 hours.  If you had a scopolamine patch placed behind your ear for the management of post- operative nausea and/or vomiting:  1. The medication in the patch is effective for 72 hours, after which it should be removed.  Wrap patch in a tissue and discard in the trash. Wash hands thoroughly with soap and water. 2. You may remove the patch earlier than 72 hours if you experience unpleasant side effects which may include dry mouth, dizziness or visual disturbances. 3. Avoid touching the patch. Wash your hands with soap and water after contact with the patch.

## 2022-09-18 NOTE — Anesthesia Procedure Notes (Signed)
Procedure Name: LMA Insertion Date/Time: 09/18/2022 8:58 AM  Performed by: Ezequiel Kayser, CRNAPre-anesthesia Checklist: Patient identified, Emergency Drugs available, Suction available and Patient being monitored Patient Re-evaluated:Patient Re-evaluated prior to induction Oxygen Delivery Method: Circle System Utilized Preoxygenation: Pre-oxygenation with 100% oxygen Induction Type: IV induction Ventilation: Mask ventilation without difficulty LMA: LMA inserted LMA Size: 4.0 Number of attempts: 1 Airway Equipment and Method: Bite block Placement Confirmation: positive ETCO2 Tube secured with: Tape Dental Injury: Teeth and Oropharynx as per pre-operative assessment

## 2022-09-18 NOTE — Anesthesia Postprocedure Evaluation (Signed)
Anesthesia Post Note  Patient: Vickie Lane  Procedure(s) Performed: LEFT BREAST LUMPECTOMY WITH RADIOACTIVE SEED LOCALIZATION (Left: Breast)     Patient location during evaluation: PACU Anesthesia Type: General Level of consciousness: awake and alert Pain management: pain level controlled Vital Signs Assessment: post-procedure vital signs reviewed and stable Respiratory status: spontaneous breathing, nonlabored ventilation and respiratory function stable Cardiovascular status: blood pressure returned to baseline and stable Postop Assessment: no apparent nausea or vomiting Anesthetic complications: no   No notable events documented.  Last Vitals:  Vitals:   09/18/22 1030 09/18/22 1049  BP: 113/72 131/80  Pulse: 71 64  Resp: 15 13  Temp:  36.5 C  SpO2: 100% 99%    Last Pain:  Vitals:   09/18/22 1049  TempSrc:   PainSc: 0-No pain                 Demesha Boorman,W. EDMOND

## 2022-09-18 NOTE — Interval H&P Note (Signed)
History and Physical Interval Note:  09/18/2022 7:50 AM  Vickie Lane  has presented today for surgery, with the diagnosis of LEFT BREAST DCIS.  The various methods of treatment have been discussed with the patient and family. After consideration of risks, benefits and other options for treatment, the patient has consented to  Procedure(s): LEFT BREAST LUMPECTOMY WITH RADIOACTIVE SEED LOCALIZATION (Left) as a surgical intervention.  The patient's history has been reviewed, patient examined, no change in status, stable for surgery.  I have reviewed the patient's chart and labs.  Questions were answered to the patient's satisfaction.     Maia Petties

## 2022-09-18 NOTE — Anesthesia Preprocedure Evaluation (Signed)
Anesthesia Evaluation  Patient identified by MRN, date of birth, ID band Patient awake    Reviewed: Allergy & Precautions, H&P , NPO status , Patient's Chart, lab work & pertinent test results, reviewed documented beta blocker date and time   Airway Mallampati: II  TM Distance: >3 FB Neck ROM: Full    Dental no notable dental hx. (+) Teeth Intact, Dental Advisory Given   Pulmonary neg pulmonary ROS,    Pulmonary exam normal breath sounds clear to auscultation       Cardiovascular hypertension, Pt. on medications and Pt. on home beta blockers  Rhythm:Regular Rate:Normal     Neuro/Psych negative neurological ROS  negative psych ROS   GI/Hepatic Neg liver ROS, GERD  Medicated,  Endo/Other  diabetes, Well Controlled  Renal/GU Renal InsufficiencyRenal disease  negative genitourinary   Musculoskeletal  (+) Arthritis , Osteoarthritis,    Abdominal   Peds  Hematology negative hematology ROS (+)   Anesthesia Other Findings   Reproductive/Obstetrics negative OB ROS                             Anesthesia Physical Anesthesia Plan  ASA: 2  Anesthesia Plan: General   Post-op Pain Management: Tylenol PO (pre-op)*   Induction: Intravenous  PONV Risk Score and Plan: 4 or greater and Ondansetron, Dexamethasone and Midazolam  Airway Management Planned: LMA  Additional Equipment:   Intra-op Plan:   Post-operative Plan: Extubation in OR  Informed Consent: I have reviewed the patients History and Physical, chart, labs and discussed the procedure including the risks, benefits and alternatives for the proposed anesthesia with the patient or authorized representative who has indicated his/her understanding and acceptance.     Dental advisory given  Plan Discussed with: CRNA  Anesthesia Plan Comments:         Anesthesia Quick Evaluation

## 2022-09-18 NOTE — Transfer of Care (Signed)
Immediate Anesthesia Transfer of Care Note  Patient: Vickie Lane  Procedure(s) Performed: LEFT BREAST LUMPECTOMY WITH RADIOACTIVE SEED LOCALIZATION (Left: Breast)  Patient Location: PACU  Anesthesia Type:General  Level of Consciousness: drowsy  Airway & Oxygen Therapy: Patient Spontanous Breathing and Patient connected to face mask oxygen  Post-op Assessment: Report given to RN and Post -op Vital signs reviewed and stable  Post vital signs: Reviewed and stable  Last Vitals:  Vitals Value Taken Time  BP 117/77   Temp    Pulse 77 09/18/22 0959  Resp 15 09/18/22 0959  SpO2 100 % 09/18/22 0959  Vitals shown include unvalidated device data.  Last Pain:  Vitals:   09/18/22 0732  TempSrc: Oral  PainSc: 0-No pain      Patients Stated Pain Goal: 3 (81/27/51 7001)  Complications: No notable events documented.

## 2022-09-19 ENCOUNTER — Encounter (HOSPITAL_BASED_OUTPATIENT_CLINIC_OR_DEPARTMENT_OTHER): Payer: Self-pay | Admitting: Surgery

## 2022-09-19 ENCOUNTER — Ambulatory Visit (INDEPENDENT_AMBULATORY_CARE_PROVIDER_SITE_OTHER): Payer: No Typology Code available for payment source | Admitting: Obstetrics and Gynecology

## 2022-09-19 ENCOUNTER — Other Ambulatory Visit (HOSPITAL_COMMUNITY): Payer: Self-pay

## 2022-09-19 VITALS — BP 149/86 | HR 68 | Wt 213.5 lb

## 2022-09-19 DIAGNOSIS — A6000 Herpesviral infection of urogenital system, unspecified: Secondary | ICD-10-CM | POA: Diagnosis not present

## 2022-09-19 DIAGNOSIS — Z01419 Encounter for gynecological examination (general) (routine) without abnormal findings: Secondary | ICD-10-CM | POA: Diagnosis not present

## 2022-09-19 MED ORDER — VALACYCLOVIR HCL 500 MG PO TABS
500.0000 mg | ORAL_TABLET | Freq: Every day | ORAL | 11 refills | Status: DC
  Filled 2022-09-19: qty 30, 30d supply, fill #0
  Filled 2022-11-07: qty 30, 30d supply, fill #1
  Filled 2022-12-29 – 2023-02-19 (×2): qty 30, 30d supply, fill #2
  Filled 2023-04-03: qty 30, 30d supply, fill #3
  Filled 2023-05-19: qty 30, 30d supply, fill #4
  Filled 2023-06-14: qty 30, 30d supply, fill #5
  Filled 2023-08-21: qty 30, 30d supply, fill #6
  Filled 2023-09-17: qty 30, 30d supply, fill #7

## 2022-09-19 NOTE — Progress Notes (Signed)
GYNECOLOGY ANNUAL PREVENTATIVE CARE ENCOUNTER NOTE  History:     Vickie Lane is a 61 y.o. [phone] female here for a routine annual gynecologic exam.  Current complaints: recent breast cancer diagnosis s/p lumpectomy on 09/18/22.   Denies abnormal vaginal bleeding, discharge, pelvic pain, problems with intercourse or other gynecologic concerns.    Gynecologic History No LMP recorded. Patient has had a hysterectomy. Contraception: status post hysterectomy Last Pap: n/a s/p hyst Last mammogram: 07/24/22 . Results were: abnormal, pt s/p lumpectomy on 09/18/22  Obstetric History OB History  Gravida Para Term Preterm AB Living  '6 3 1 2 3 3  '$ SAB IAB Ectopic Multiple Live Births  '1 2     3    '$ # Outcome Date GA Lbr Len/2nd Weight Sex Delivery Anes PTL Lv  6 Preterm 03/05/92 [redacted]w[redacted]d  M CS-LTranv   LIV  5 Preterm 07/30/90 [id number] M CS-LTranv   LIV  4 Term 03/15/76    F Vag-Spont   LIV  3 IAB           2 IAB           1 SAB             Past Medical History:  Diagnosis Date   Allergy 1990   Arthritis 2011   "back" (11/07/2012)   Breast cancer ([facility]   Chest pain    Chronic lower back pain    Constipation    GERD (gastroesophageal reflux disease) 1991   Heart murmur    "MVP" (`11/07/2012)   History of blood transfusion 1990's   "? w/one of my deliveries" (11/07/2012)   Hyperlipidemia 2019   LDL   Hypertension 1993   Joint pain 2011   osteo arthritis   Kidney problem 2008   Lactose intolerance    Mitral valve prolapse    Seizures ([facility]   "related to pregnancy; I had HELLP" (11/07/2012)- lastseizure was 1991 per pt    Sickle cell trait ([facility]   Systemic [place] erythematosus [place]   joint pain   Type II diabetes mellitus ([facility]/2012   Diet and exercise.    Past Surgical History:  Procedure Laterality Date   ABDOMINAL HYSTERECTOMY  1997   BREAST LUMPECTOMY WITH RADIOACTIVE SEED LOCALIZATION Left 09/18/2022   Procedure: LEFT BREAST LUMPECTOMY  WITH RADIOACTIVE SEED LOCALIZATION;  Surgeon: TsDonnie MesaMD;  [facility] Service: General;  Laterality: Left;   CARPAL TUNNEL RELEASE  2008   "right w/fracture OR" (11/07/2012)   [facility]   "L4-5" (11/07/2012)   TRIGGER FINGER RELEASE Right 05/04/2016   Procedure: MINOR RELEASE TRIGGER FINGER/A-1 PULLEY;  Surgeon: MaCharlotte CrumbMD;  [facility] Service: Orthopedics;  Laterality: Right;   WRIST FRACTURE SURGERY  2008   "right" (11/07/2012)    Current Outpatient Medications on File Prior to Visit  Medication Sig Dispense Refill   aspirin EC 81 MG tablet Take 81 mg by mouth every morning.     b complex vitamins capsule Take 1 capsule by mouth daily.     carvedilol (COREG) 6.25 MG tablet Take 1 tablet (6.25 mg total) by mouth 2 (two) times daily. 180 tablet 3   Cetirizine-Pseudoephedrine (ZYRTEC-D PO) Take 1 tablet by mouth daily as needed. For allergies     chlorthalidone (HYGROTON)  25 MG tablet Take 1 tablet (25 mg total) by mouth daily. 90 tablet 3   Cholecalciferol (VITAMIN D3 PO) Take 125 mcg by mouth daily.     esomeprazole (NEXIUM) 20 MG packet Take 20 mg by mouth as needed.     FREESTYLE LITE test strip   11   Lancets (FREESTYLE) lancets   12   MAGNESIUM GLYCINATE PO Take by mouth.     meloxicam (MOBIC) 7.5 MG tablet Take 7.5 mg by mouth daily as needed for pain.     Multiple Vitamin (MULTIVITAMIN WITH MINERALS) TABS Take 1 tablet by mouth daily.     simvastatin (ZOCOR) 40 MG tablet Take 1 tablet (40 mg total) by mouth at bedtime. 90 tablet 3   spironolactone (ALDACTONE) 25 MG tablet Take 1 tablet by mouth once a day 90 tablet 0   valsartan (DIOVAN) 320 MG tablet Take 1 tablet (320 mg total) by mouth daily. 90 tablet 3   vitamin B-12 (CYANOCOBALAMIN) 1000 MCG tablet Take 5,000 mcg by mouth daily.     No current  facility-administered medications on file prior to visit.    Allergies  Allergen Reactions   Amlodipine Swelling   Amlodipine Besylate Swelling    Other reaction(s): anaphylaxis   Ciprofloxacin Anaphylaxis   Codeine Nausea Only, Other (See Comments), Anaphylaxis and Nausea And Vomiting    "dry heaves" (11/07/2012) Other reaction(s): stomach upset   Hydrocodone Nausea Only and Other (See Comments)    "dry heaves" (11/07/2012)   Levofloxacin Anaphylaxis and Rash    Other reaction(s): anaphylaxis   Ultram [Tramadol Hcl] Nausea Only and Other (See Comments)    "dry heaves" (11/07/2012)   Hydrocodone-Acetaminophen Nausea And Vomiting    Other reaction(s): stomach upset    Social History:  reports that she has never smoked. She has never used smokeless tobacco. She reports current alcohol use. She reports that she does not use drugs.  Family History  Problem Relation Age of Onset   Hypertension Mother    Hypertension Father    Heart attack Father    Sudden death Father    Cancer Paternal Aunt        GYN (uterine/cervical); d. mid 41s   Heart disease Other    Hypertension Other    Colon cancer Neg Hx    Colon polyps Neg Hx    Rectal cancer Neg Hx    Stomach cancer Neg Hx    Esophageal cancer Neg Hx     The following portions of the patient's history were reviewed and updated as appropriate: allergies, current medications, past family history, past medical history, past social history, past surgical history and problem list.  Review of Systems Pertinent items noted in HPI and remainder of comprehensive ROS otherwise negative.  Physical Exam:  BP (!) 149/86   Pulse 68   Wt 213 lb 8 oz (96.8 kg)   BMI 34.46 kg/m  CONSTITUTIONAL: Well-developed, well-nourished female in no acute distress.  HENT:  Normocephalic, atraumatic, External right and left ear normal. Oropharynx is clear and moist EYES: Conjunctivae and EOM are normal.  NECK: Normal range of motion, supple, no  masses.  Normal thyroid.  SKIN: Skin is warm and dry. No rash noted. Not diaphoretic. No erythema. No pallor. MUSCULOSKELETAL: Normal range of motion. No tenderness.  No cyanosis, clubbing, or edema.  2+ distal pulses. NEUROLOGIC: Alert and oriented to person, place, and time. Normal reflexes, muscle tone coordination.  PSYCHIATRIC: Normal mood and affect. Normal behavior. Normal judgment and  thought content. CARDIOVASCULAR: Normal heart rate noted, regular rhythm RESPIRATORY: Clear to auscultation bilaterally. Effort and breath sounds normal, no problems with respiration noted. BREASTS: deferred due to recent surgery ABDOMEN: Soft, no distention noted.  No tenderness, rebound or guarding.  PELVIC: Normal appearing external genitalia and urethral meatus; normal appearing vaginal mucosa and cervix.  No abnormal discharge noted. Uterus and cervix absent, no obvious vaginal atony  Performed in the presence of a chaperone.   Assessment and Plan:    1. Women's annual routine gynecological examination Normal annual exam, recent diagnosis and treatment of breast cancer. 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  Dispense: 30 tablet; Refill: 11  Breast management per surgical team and oncology team.  Per pt no chemo, but will receive radiation therapy in a month  Routine preventative health maintenance measures emphasized. Please refer to After Visit Summary for other counseling recommendations.      Lynnda Shields, MD, [facility], [place]

## 2022-09-19 NOTE — Progress Notes (Signed)
Pt is in the office for annual Hx of abd hysterectomy in 1997 Pt denies any abnormal symptoms today. Requests refill on valacyclovir  GAD7= 2 PHQ9= 0

## 2022-09-24 LAB — SURGICAL PATHOLOGY

## 2022-09-25 ENCOUNTER — Encounter: Payer: Self-pay | Admitting: *Deleted

## 2022-10-02 ENCOUNTER — Ambulatory Visit: Payer: Self-pay | Admitting: Genetic Counselor

## 2022-10-02 ENCOUNTER — Inpatient Hospital Stay
Payer: No Typology Code available for payment source | Attending: Hematology and Oncology | Admitting: Hematology and Oncology

## 2022-10-02 ENCOUNTER — Telehealth: Payer: Self-pay | Admitting: Genetic Counselor

## 2022-10-02 ENCOUNTER — Other Ambulatory Visit (HOSPITAL_COMMUNITY): Payer: Self-pay

## 2022-10-02 ENCOUNTER — Encounter: Payer: Self-pay | Admitting: Hematology and Oncology

## 2022-10-02 VITALS — BP 144/84 | HR 85 | Temp 97.6°F | Resp 16 | Ht 66.0 in | Wt 214.3 lb

## 2022-10-02 DIAGNOSIS — I10 Essential (primary) hypertension: Secondary | ICD-10-CM | POA: Insufficient documentation

## 2022-10-02 DIAGNOSIS — D0512 Intraductal carcinoma in situ of left breast: Secondary | ICD-10-CM

## 2022-10-02 DIAGNOSIS — M329 Systemic lupus erythematosus, unspecified: Secondary | ICD-10-CM | POA: Diagnosis not present

## 2022-10-02 DIAGNOSIS — R232 Flushing: Secondary | ICD-10-CM | POA: Insufficient documentation

## 2022-10-02 DIAGNOSIS — E119 Type 2 diabetes mellitus without complications: Secondary | ICD-10-CM | POA: Insufficient documentation

## 2022-10-02 DIAGNOSIS — M129 Arthropathy, unspecified: Secondary | ICD-10-CM | POA: Diagnosis not present

## 2022-10-02 DIAGNOSIS — Z170 Estrogen receptor positive status [ER+]: Secondary | ICD-10-CM | POA: Diagnosis not present

## 2022-10-02 DIAGNOSIS — I341 Nonrheumatic mitral (valve) prolapse: Secondary | ICD-10-CM | POA: Insufficient documentation

## 2022-10-02 DIAGNOSIS — Z7982 Long term (current) use of aspirin: Secondary | ICD-10-CM | POA: Diagnosis not present

## 2022-10-02 DIAGNOSIS — Z1379 Encounter for other screening for genetic and chromosomal anomalies: Secondary | ICD-10-CM

## 2022-10-02 DIAGNOSIS — E785 Hyperlipidemia, unspecified: Secondary | ICD-10-CM | POA: Diagnosis not present

## 2022-10-02 DIAGNOSIS — K219 Gastro-esophageal reflux disease without esophagitis: Secondary | ICD-10-CM | POA: Insufficient documentation

## 2022-10-02 DIAGNOSIS — Z79899 Other long term (current) drug therapy: Secondary | ICD-10-CM | POA: Diagnosis not present

## 2022-10-02 DIAGNOSIS — D573 Sickle-cell trait: Secondary | ICD-10-CM | POA: Diagnosis not present

## 2022-10-02 DIAGNOSIS — Z85828 Personal history of other malignant neoplasm of skin: Secondary | ICD-10-CM

## 2022-10-02 MED ORDER — GABAPENTIN 300 MG PO CAPS
300.0000 mg | ORAL_CAPSULE | Freq: Every day | ORAL | 0 refills | Status: DC
  Filled 2022-10-02: qty 60, 60d supply, fill #0

## 2022-10-02 NOTE — Progress Notes (Signed)
HPI:   Vickie Lane was previously seen in the [place] clinic due to a personal history of DCIS and concerns regarding a hereditary predisposition to cancer. Please refer to our prior cancer genetics clinic note for more information regarding our discussion, assessment and recommendations, at the time. Vickie Lane recent genetic test results were disclosed to her, as were recommendations warranted by these results. These results and recommendations are discussed in more detail below.  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    Oncology History  Ductal carcinoma in situ (DCIS) of left breast  07/27/2022 Mammogram   Bilateral screening mammogram showed left breast calcifications warranting further evaluation.  No findings suspicious for malignancy in the right breast.  Diagnostic mammogram of the left breast confirmed 1.2 x 0.8 x 1.1 cm hypoechoic circumscribed mass in the left breast at 2:00, no hilar fat or convincing hilar blood flow.  Ultrasound showed several normal lymph nodes.  No enlarged or abnormal left axillary lymph nodes.   08/21/2022 Pathology Results   Pathology from the left breast needle core biopsy showed intermediate nuclear grade DCIS, necrosis present, calcs present, ER 95% positive strong staining PR 40% positive strong staining   08/31/2022 Initial Diagnosis   Ductal carcinoma in situ (DCIS) of left breast   09/10/2022 Genetic Testing   Negative [facility] CustomNext-Cancer +RNAinsight Panel.  Report date is 09/23/2022.    The CustomNext-Cancer+RNAinsight panel offered by [facility] includes sequencing and rearrangement analysis for the following 50 genes:  APC, ATM, BAP1, BARD1, BMPR1A, BRCA1, BRCA2, BRIP1, CDH1, CDK4, CDKN2A, CHEK2,  DICER1, MEN1, MLH1, MSH2, MSH6, MUTYH, NBN, NF1, NF2, NTHL1, PALB2, PMS2, POT1, PTEN, RAD51C, RAD51D, RECQL, RET, SDHA, SDHAF2, SDHB, SDHC, SDHD, SMAD4, SMARCA4, STK11, TP53, TSC1, TSC2 and VHL (sequencing and deletion/duplication); AXIN2, HOXB13, MITF, MSH3, POLD1 and POLE (sequencing only); EPCAM and GREM1 (deletion/duplication only). RNA data is routinely analyzed for use in variant interpretation for all genes.         FAMILY HISTORY:  We obtained a detailed, 4-generation family history.  Significant diagnoses are listed below:      Family History  Problem Relation Age of Onset   Cancer Paternal Aunt          GYN (uterine/cervical); d. mid 24s       Vickie Lane is unaware of previous family history of genetic testing for hereditary cancer risks. There is no reported Ashkenazi Jewish ancestry. There is no known consanguinity.    GENETIC TEST RESULTS:  The [facility] CustomNext-Cancer +RNAinsight Panel found no pathogenic mutations. The CustomNext-Cancer+RNAinsight panel offered by [facility] includes sequencing and rearrangement analysis for the following 50 genes:  APC, ATM, BAP1, BARD1, BMPR1A, BRCA1, BRCA2, BRIP1, CDH1, CDK4, CDKN2A, CHEK2, DICER1, MEN1, MLH1, MSH2, MSH6, MUTYH, NBN, NF1, NF2, NTHL1, PALB2, PMS2, POT1, PTEN, RAD51C, RAD51D, RECQL, RET, SDHA, SDHAF2, SDHB, SDHC, SDHD, SMAD4, SMARCA4, STK11, TP53, TSC1, TSC2 and VHL (sequencing and deletion/duplication); AXIN2, HOXB13, MITF, MSH3, POLD1 and POLE (sequencing only); EPCAM and GREM1 (deletion/duplication only). RNA data is routinely analyzed for use in variant interpretation for all genes.  The test report has been scanned into EPIC and is located under the Molecular Pathology section of the Results Review tab.  A portion of the result report is included below for reference.  Genetic testing reported out on September 23, 2022.     Genetic testing identified a variant of uncertain significance (VUS) in the  p.G1062R  (c.3184G>A) gene called MSH3.  At this time, it is unknown if this variant is associated with an increased risk for cancer or if it is benign, but most uncertain variants are reclassified to benign. It should not be used to make medical management decisions. With time, we suspect the laboratory will determine the significance of this variant, if any. If the laboratory reclassifies this variant, we will attempt to contact [name] to discuss it further.   Even though a pathogenic variant was not identified, possible explanations for the cancer in the family may include: There may be no hereditary risk for cancer in the family. The cancers in [name] and/or her family may be sporadic/familial or due to other genetic and environmental factors. There may be a gene mutation in one of these genes that current testing methods cannot detect but that chance is small. There could be another gene that has not yet been discovered, or that we have not yet tested, that is responsible for the cancer diagnoses in the family.   Therefore, it is important to remain in touch with cancer genetics in the future so that we can continue to offer [name] the most up to date genetic testing.   ADDITIONAL GENETIC TESTING:  We discussed with Vickie Lane that her genetic testing was fairly extensive.  If there are additional relevant genes identified to increase cancer risk that can be analyzed in the future, we would be happy to discuss and coordinate this testing at that time.     CANCER SCREENING RECOMMENDATIONS:  Vickie Lane test result is considered negative (normal).  This means that we have not identified a hereditary cause for her personal history of DCIS at this time.    An individual's cancer risk and medical management are not determined by genetic test results alone. Overall cancer risk assessment incorporates additional factors, including personal medical history, family history,  and any available genetic information that may result in a personalized plan for cancer prevention and surveillance. Therefore, it is recommended she continue to follow the cancer management and screening guidelines provided by her oncology and primary healthcare provider.  RECOMMENDATIONS FOR FAMILY MEMBERS:   Since she did not inherit a identifiable mutation in a cancer predisposition gene included on this panel, her children could not have inherited a known mutation from her in one of these genes. Individuals in this family might be at some increased risk of developing cancer, over the general population risk, due to the family history of cancer.  Individuals in the family should notify their providers of the family history of cancer. We recommend women in this family have a yearly mammogram beginning at age 54, or 61 years younger than the earliest onset of cancer, an annual clinical breast exam, and perform monthly breast self-exams.   We do not recommend familial testing for the MSH3 variant of uncertain significance (VUS).  FOLLOW-UP:  Lastly, we discussed with Vickie Lane that cancer genetics is a rapidly advancing field and it is possible that new genetic tests will be appropriate for her and/or her family members in the future. We encouraged her to remain in contact with cancer genetics on an annual basis so we can update her personal and family histories and let her know of advances in cancer genetics that may benefit this family.   Our contact  number was provided. Vickie Lane questions were answered to her satisfaction, and she knows she is welcome to call us at anytime with additional questions or concerns.   Vickie Lane M. Joette Catching, [place], [facility] Genetic Counselor [email or web address] (P) [phone]

## 2022-10-02 NOTE — Telephone Encounter (Signed)
Revealed negative pan-cancer panel.    

## 2022-10-02 NOTE — Assessment & Plan Note (Signed)
This is a very pleasant 61 year old female patient with newly diagnosed left breast DCIS, ER/PR positive referred to breast [place] for recommendations.  She is now status post left breast lumpectomy here for adjuvant recommendations.  She will be following up with Dr. Sondra Come soon for adjuvant radiation.   We have reviewed final pathology which confirmed DCIS, no invasive cancer, negative margins.  She is leaning towards tamoxifen for antiestrogen therapy after completion of radiation.  She is also having severe hot flashes since she discontinued the hormone supplementation.  We have today discussed about options for treatment including Effexor, gabapentin, clonidine versus oxybutynin and some newer therapies.  She is leaning towards gabapentin since this will also help her pain.  We started her on 300 mg nightly for management of vasomotor symptoms and if well-tolerated and hot flashes are not as well controlled, she can try doubling up on the dose to 600 mg nightly. She expressed understanding of all the recommendations.  She will return to clinic after completion of radiation or sooner as needed.  Postop site appears to be healing well without any concerns today.

## 2022-10-02 NOTE — Progress Notes (Signed)
[facility] NOTE  Patient Care Team: Kelton Pillar, MD as PCP - General (Family Medicine) Josue Hector, MD as PCP - Cardiology (Cardiology) Rockwell Germany, RN as Oncology Nurse Navigator Mauro Kaufmann, RN as Oncology Nurse Navigator Donnie Mesa, MD as Consulting Physician (General Surgery) Benay Pike, MD as Consulting Physician (Hematology and Oncology) Gery Pray, MD as Consulting Physician (Radiation Oncology)  CHIEF COMPLAINTS/PURPOSE OF CONSULTATION:  Newly diagnosed breast cancer  HISTORY OF PRESENTING ILLNESS:  Vickie Lane 61 y.o. female is here because of recent diagnosis of left breast DCIS  I reviewed her records extensively and collaborated the history with the patient.  SUMMARY OF ONCOLOGIC HISTORY: Oncology History  Ductal carcinoma in situ (DCIS) of left breast  07/27/2022 Mammogram   Bilateral screening mammogram showed left breast calcifications warranting further evaluation.  No findings suspicious for malignancy in the right breast.  Diagnostic mammogram of the left breast confirmed 1.2 x 0.8 x 1.1 cm hypoechoic circumscribed mass in the left breast at 2:00, no hilar fat or convincing hilar blood flow.  Ultrasound showed several normal lymph nodes.  No enlarged or abnormal left axillary lymph nodes.   08/21/2022 Pathology Results   Pathology from the left breast needle core biopsy showed intermediate nuclear grade DCIS, necrosis present, calcs present, ER 95% positive strong staining PR 40% positive strong staining   08/31/2022 Initial Diagnosis   Ductal carcinoma in situ (DCIS) of left breast   09/10/2022 Genetic Testing   Negative BRCAPlus Panel.   Report date is 09/10/2022.   The BRCAplus panel offered by [employer] and includes sequencing and deletion/duplication analysis for the following 8 genes: ATM, BRCA1, BRCA2, CDH1, CHEK2, PALB2, PTEN, and TP53.  Pan-cancer panel is pending.    She had left  breast lumpectomy since last visit which showed DCIS, intermediate grade, 1.5 cm, margins are negative, prior prognostic showed ER 95% positive PR 40% positive  She is also on estrogen patch and has been on it for almost 20 years.  About 20 years ago she had a hysterectomy, no oophorectomy and apparently had severe hot flashes and hence was started on estrogen supplementation.    Interval History  Since last visit, she underwent left breast lumpectomy, healed very well so far.   She discontinued estrogen patches and is now going through severe hot flashes both day and night which have been causing some insomnia.   Other than the hot flashes and insomnia, she is scheduled to see Dr. Randa Ngo at the end of this month, has a vacation planned in mid December hence may start radiation after that vacation. Rest of the pertinent 10 point ROS reviewed and negative  MEDICAL HISTORY:  Past Medical History:  Diagnosis Date   Allergy 1990   Arthritis 2011   "back" (11/07/2012)   Breast cancer ([place])    Chest pain    Chronic lower back pain    Constipation    GERD (gastroesophageal reflux disease) 1991   Heart murmur    "MVP" (`11/07/2012)   History of blood transfusion 1990's   "? w/one of my deliveries" (11/07/2012)   Hyperlipidemia 2019   LDL   Hypertension 1993   Joint pain 2011   osteo arthritis   Kidney problem 2008   Lactose intolerance    Mitral valve prolapse    Seizures ([place]) 1991   "related to pregnancy; I had HELLP" (11/07/2012)- lastseizure was 1991 per pt    Sickle cell trait ([place])  Systemic lupus erythematosus ([place]) 1992   joint pain   Type II diabetes mellitus ([place]) 08/2011   Diet and exercise.    SURGICAL HISTORY: Past Surgical History:  Procedure Laterality Date   ABDOMINAL HYSTERECTOMY  1997   BREAST LUMPECTOMY WITH RADIOACTIVE SEED LOCALIZATION Left 09/18/2022   Procedure: LEFT BREAST LUMPECTOMY WITH RADIOACTIVE SEED LOCALIZATION;  Surgeon: Donnie Mesa, MD;   Location: [facility];  Service: General;  Laterality: Left;   CARPAL TUNNEL RELEASE  2008   "right w/fracture OR" (11/07/2012)   [place]  11/07/2012   "L4-5" (11/07/2012)   TRIGGER FINGER RELEASE Right 05/04/2016   Procedure: MINOR RELEASE TRIGGER FINGER/A-1 PULLEY;  Surgeon: Charlotte Crumb, MD;  Location: [facility];  Service: Orthopedics;  Laterality: Right;   WRIST FRACTURE SURGERY  2008   "right" (11/07/2012)    SOCIAL HISTORY: Social History   Socioeconomic History   Marital status: Single    Spouse name: Not on file   Number of children: Not on file   Years of education: Not on file   Highest education level: Not on file  Occupational History   Occupation: Lawyer: [employer]  Tobacco Use   Smoking status: Never   Smokeless tobacco: Never  Vaping Use   Vaping Use: Never used  Substance and Sexual Activity   Alcohol use: Yes    Comment: 11/07/2012 "once/month might have a glass of wine"   Drug use: No   Sexual activity: Yes    Birth control/protection: Surgical  Other Topics Concern   Not on file  Social History Narrative   Not on file   Social Determinants of Health   Financial Resource Strain: Low Risk  (09/05/2022)   Overall Financial Resource Strain (CARDIA)    Difficulty of Paying Living Expenses: Not hard at all  Food Insecurity: No Food Insecurity (09/05/2022)   Hunger Vital Sign    Worried About Running Out of Food in the Last Year: Never true    [place] in the Last Year: Never true  Transportation Needs: No Transportation Needs (09/05/2022)   PRAPARE - Hydrologist (Medical): No    Lack of Transportation (Non-Medical): No  Physical Activity: Sufficiently Active (10/03/2021)   Exercise Vital Sign    Days of Exercise per Week: 4 days    Minutes of Exercise per Session: 50 min   Stress: Not on file  Social Connections: Not on file  Intimate Partner Violence: Not on file    FAMILY HISTORY: Family History  Problem Relation Age of Onset   Hypertension Mother    Hypertension Father    Heart attack Father    Sudden death Father    Cancer Paternal Aunt        GYN (uterine/cervical); d. mid 79s   Heart disease Other    Hypertension Other    Colon cancer Neg Hx    Colon polyps Neg Hx    Rectal cancer Neg Hx    Stomach cancer Neg Hx    Esophageal cancer Neg Hx     ALLERGIES:  is allergic to amlodipine, amlodipine besylate, ciprofloxacin, codeine, hydrocodone, levofloxacin, ultram [tramadol hcl], and hydrocodone-acetaminophen.  MEDICATIONS:  Current Outpatient Medications  Medication Sig Dispense Refill   gabapentin (NEURONTIN) 300 MG capsule Take 1 capsule (300 mg total) by mouth at bedtime.  60 capsule 0   aspirin EC 81 MG tablet Take 81 mg by mouth every morning.     b complex vitamins capsule Take 1 capsule by mouth daily.     carvedilol (COREG) 6.25 MG tablet Take 1 tablet (6.25 mg total) by mouth 2 (two) times daily. 180 tablet 3   Cetirizine-Pseudoephedrine (ZYRTEC-D PO) Take 1 tablet by mouth daily as needed. For allergies     chlorthalidone (HYGROTON) 25 MG tablet Take 1 tablet (25 mg total) by mouth daily. 90 tablet 3   Cholecalciferol (VITAMIN D3 PO) Take 125 mcg by mouth daily.     esomeprazole (NEXIUM) 20 MG packet Take 20 mg by mouth as needed.     FREESTYLE LITE test strip   11   Lancets (FREESTYLE) lancets   12   MAGNESIUM GLYCINATE PO Take by mouth.     meloxicam (MOBIC) 7.5 MG tablet Take 7.5 mg by mouth daily as needed for pain.     Multiple Vitamin (MULTIVITAMIN WITH MINERALS) TABS Take 1 tablet by mouth daily.     simvastatin (ZOCOR) 40 MG tablet Take 1 tablet (40 mg total) by mouth at bedtime. 90 tablet 3   spironolactone (ALDACTONE) 25 MG tablet Take 1 tablet by mouth once a day 90 tablet 0   valACYclovir (VALTREX) 500 MG tablet Take  1 tablet (500 mg total) by mouth daily. 30 tablet 11   valsartan (DIOVAN) 320 MG tablet Take 1 tablet (320 mg total) by mouth daily. 90 tablet 3   vitamin B-12 (CYANOCOBALAMIN) 1000 MCG tablet Take 5,000 mcg by mouth daily.     No current facility-administered medications for this visit.    REVIEW OF SYSTEMS:   Constitutional: Denies fevers, chills or abnormal night sweats Eyes: Denies blurriness of vision, double vision or watery eyes Ears, nose, mouth, throat, and face: Denies mucositis or sore throat Respiratory: Denies cough, dyspnea or wheezes Cardiovascular: Denies palpitation, chest discomfort or lower extremity swelling Gastrointestinal:  Denies nausea, heartburn or change in bowel habits Skin: Denies abnormal skin rashes Lymphatics: Denies new lymphadenopathy or easy bruising Neurological:Denies numbness, tingling or new weaknesses Behavioral/Psych: Mood is stable, no new changes  Breast: Denies any palpable lumps or discharge All other systems were reviewed with the patient and are negative.  PHYSICAL EXAMINATION: ECOG PERFORMANCE STATUS: 0 - Asymptomatic  Vitals:   10/02/22 1341  BP: (!) 144/84  Pulse: 85  Resp: 16  Temp: 97.6 F (36.4 C)  SpO2: 100%   Filed Weights   10/02/22 1341  Weight: 214 lb 4.8 oz (97.2 kg)    GENERAL:alert, no distress and comfortable Left breast healing well, Steri-Strips in place.  LABORATORY DATA:  I have reviewed the data as listed Lab Results  Component Value Date   WBC 5.8 09/05/2022   HGB 13.0 09/05/2022   HCT 39.2 09/05/2022   MCV 83.9 09/05/2022   PLT 277 09/05/2022   Lab Results  Component Value Date   NA 137 09/05/2022   K 4.1 09/05/2022   CL 103 09/05/2022   CO2 28 09/05/2022    RADIOGRAPHIC STUDIES: I have personally reviewed the radiological reports and agreed with the findings in the report.  ASSESSMENT AND PLAN:  Ductal carcinoma in situ (DCIS) of left breast This is a very pleasant 61 year old female  patient with newly diagnosed left breast DCIS, ER/PR positive referred to breast [facility] for recommendations.  She is now status post left breast lumpectomy here for adjuvant recommendations.  She will be following  up with Dr. Sondra Come soon for adjuvant radiation.   We have reviewed final pathology which confirmed DCIS, no invasive cancer, negative margins.  She is leaning towards tamoxifen for antiestrogen therapy after completion of radiation.  She is also having severe hot flashes since she discontinued the hormone supplementation.  We have today discussed about options for treatment including Effexor, gabapentin, clonidine versus oxybutynin and some newer therapies.  She is leaning towards gabapentin since this will also help her pain.  We started her on 300 mg nightly for management of vasomotor symptoms and if well-tolerated and hot flashes are not as well controlled, she can try doubling up on the dose to 600 mg nightly. She expressed understanding of all the recommendations.  She will return to clinic after completion of radiation or sooner as needed.  Postop site appears to be healing well without any concerns today.  Total time spent: 30 min including history, physical exam, review of records, counseling and coordination of care  All questions were answered. The patient knows to call the clinic with any problems, questions or concerns.    Benay Pike, MD 10/02/22

## 2022-10-03 ENCOUNTER — Telehealth: Payer: Self-pay | Admitting: Hematology and Oncology

## 2022-10-03 NOTE — Telephone Encounter (Signed)
Scheduled appointment per 11/14 los. Patient is aware.

## 2022-10-04 ENCOUNTER — Other Ambulatory Visit (HOSPITAL_COMMUNITY): Payer: Self-pay

## 2022-10-04 MED ORDER — SPIRONOLACTONE 25 MG PO TABS
25.0000 mg | ORAL_TABLET | Freq: Every day | ORAL | 1 refills | Status: DC
  Filled 2022-10-04: qty 90, 90d supply, fill #0

## 2022-10-05 NOTE — Progress Notes (Signed)
Location of Breast Cancer: left breast DCIS  Histology per Pathology Report:   08/21/2022 Diagnosis Breast, left, needle core biopsy, UOQ depth - DUCTAL CARCINOMA IN SITU, INTERMEDIATE NUCLEAR GRADE - NECROSIS: PRESENT - CALCIFICATIONS: PRESENT - DCIS LENGTH: 0.7 CM  09/18/2022 FINAL MICROSCOPIC DIAGNOSIS:   A. BREAST, LEFT, LUMPECTOMY:  - Ductal carcinoma in situ, intermediate grade, 1.5 cm, pTis  - Margins are negative for ductal carcinoma in situ  - Biopsy site changes present   Receptor Status: ER(95%), PR (40%), Her2-neu (), Ki-()  Did patient present with symptoms (if so, please note symptoms) or was this found on screening mammography?:   Past/Anticipated interventions by surgeon, if any: Left radioactive seed localized lumpectomy/ Magtrace injection on 09/18/2022 by Dr. Georgette Dover  Past/Anticipated interventions by medical oncology, if any: Antiestrogen therapy after radiation.  Lymphedema issues, if any: No     Pain issues, if any:  She reports some soreness within the surgical area.   SAFETY ISSUES: Prior radiation? No Pacemaker/ICD? No Possible current pregnancy?no Is the patient on methotrexate? No  Current Complaints / other details:      Jacqulyn Liner, RN 10/05/2022,12:57 PM

## 2022-10-14 NOTE — Progress Notes (Signed)
Radiation Oncology         [phone] ________________________________  Name: Vickie Lane MRN: [id number]  Date: 10/15/2022  DOB: 29-Oct-1961  Re-Evaluation Note  CC: Kelton Pillar, MD  Benay Pike, MD    ICD-10-CM   1. Ductal carcinoma in situ (DCIS) of left breast  D05.12       Diagnosis:   Stage 0 (cTis (DCIS), cN0, cM0) Left Breast, Intermediate grade ductal carcinoma in-situ, ER+ / PR+ / Her2 not assessed, s/p lumpectomy  Narrative:  The patient returns today to discuss radiation treatment options. She was seen in the multidisciplinary breast clinic on 09/05/22.   She opted to proceed with genetic testing on her consultation date. Results showed no clinically significant variants detected by BRCAplus or +RNAinsight testing. However, a variant of unknown significance was detected in the MSH3 gene.   She opted to proceed with a left breast lumpectomy without nodal biopsies on 09/18/22 under the care of Dr. Georgette Dover. Pathology from the procedure revealed: intermediate grade DCIS measuring 1.5 cm in the greatest extent. All margins negative for in-situ disease. Prognostic indicators significant for: estrogen receptor 95% positive and progesterone receptor 40% positive; Her2 not assessed.  Closest margin was 6 mm, that being the superior margin.  The patient recently followed up with Dr. Chryl Heck on 10/02/22. Following review of antiestrogen treatment options with Dr. Chryl Heck, the patient is leaning towards tamoxifen which she will start after completing XRT. The patient also endorsed issues with intense hot flashes attributed to discontinuing her estrogen patch which she used for nearly 20 years. For her hot flashes, she will be tried on gabapentin.  She reports that the gabapentin may be helping these symptoms.  On review of systems, the patient reports no pain within the left breast. She denies nipple discharge or bleeding.  Allergies:  is allergic to amlodipine,  amlodipine besylate, ciprofloxacin, codeine, hydrocodone, levofloxacin, ultram [tramadol hcl], and hydrocodone-acetaminophen.  Meds: Current Outpatient Medications  Medication Sig Dispense Refill   aspirin EC 81 MG tablet Take 81 mg by mouth every morning.     b complex vitamins capsule Take 1 capsule by mouth daily.     carvedilol (COREG) 6.25 MG tablet Take 1 tablet (6.25 mg total) by mouth 2 (two) times daily. 180 tablet 3   Cetirizine-Pseudoephedrine (ZYRTEC-D PO) Take 1 tablet by mouth daily as needed. For allergies     chlorthalidone (HYGROTON) 25 MG tablet Take 1 tablet (25 mg total) by mouth daily. 90 tablet 3   Cholecalciferol (VITAMIN D3 PO) Take 125 mcg by mouth daily.     esomeprazole (NEXIUM) 20 MG packet Take 20 mg by mouth as needed.     FREESTYLE LITE test strip   11   gabapentin (NEURONTIN) 300 MG capsule Take 1 capsule (300 mg total) by mouth at bedtime. 60 capsule 0   Lancets (FREESTYLE) lancets   12   MAGNESIUM GLYCINATE PO Take by mouth.     meloxicam (MOBIC) 7.5 MG tablet Take 7.5 mg by mouth daily as needed for pain.     Multiple Vitamin (MULTIVITAMIN WITH MINERALS) TABS Take 1 tablet by mouth daily.     simvastatin (ZOCOR) 40 MG tablet Take 1 tablet (40 mg total) by mouth at bedtime. 90 tablet 3   spironolactone (ALDACTONE) 25 MG tablet Take 1 tablet (25 mg total) by mouth daily. 90 tablet 1   valACYclovir (VALTREX) 500 MG tablet Take 1 tablet (500 mg total) by mouth daily. 30 tablet 11  valsartan (DIOVAN) 320 MG tablet Take 1 tablet (320 mg total) by mouth daily. 90 tablet 3   vitamin B-12 (CYANOCOBALAMIN) 1000 MCG tablet Take 5,000 mcg by mouth daily.     No current facility-administered medications for this encounter.    Physical Findings: The patient is in no acute distress. Patient is alert and oriented.  height is _0  (1.702 m) and weight is 214 lb 4 oz (97.2 kg). Her temporal temperature is 96.2 F (35.7 C) (abnormal). Her blood pressure is 141/82  (abnormal) and her pulse is 77. Her respiration is 18 and oxygen saturation is 100%.   Lungs are clear to auscultation bilaterally. Heart has regular rate and rhythm. No palpable cervical, supraclavicular, or axillary adenopathy. Abdomen soft, non-tender, normal bowel sounds. Left Breast: Well-healed periareolar scar upper outer aspect of the breast.  No signs of infection within the breast.  No nipple discharge or bleeding.  Lab Findings: Lab Results  Component Value Date   WBC 5.8 09/05/2022   HGB 13.0 09/05/2022   HCT 39.2 09/05/2022   MCV 83.9 09/05/2022   PLT 277 09/05/2022    Radiographic Findings: MM Breast Surgical Specimen  Result Date: 09/18/2022 CLINICAL DATA:  Status post lumpectomy today after earlier radioactive seed localization. EXAM: SPECIMEN RADIOGRAPH OF THE LEFT BREAST COMPARISON:  Previous exam(s). FINDINGS: Status post excision of the left breast. The radioactive seed and biopsy marker clip are present and appear completely intact within the specimen. Findings discussed with the OR staff during the procedure. IMPRESSION: Specimen radiograph of the left breast. Electronically Signed   By: Franki Cabot M.D.   On: 09/18/2022 09:35   Impression:  Stage 0 (cTis (DCIS), cN0, cM0) Left Breast, Intermediate grade ductal carcinoma in-situ, ER+ / PR+ / Her2 not assessed, s/p lumpectomy  We discussed the pathologic findings in detail.  She was also given a paper copy of her path report.  We discussed options for management including adjuvant radiation therapy plus or minus adjuvant hormonal therapy.  She has met with Dr. Chryl Heck and in the patient plans on proceeding with adjuvant hormonal therapy at the appropriate time.  We discussed that the addition of radiation therapy would provide a modest local control benefit but no significant impact on overall survival.  Complicating the recommendation for radiation therapy is the patient's history of systemic lupus.  We discussed  potential increased complications with radiation therapy given this diagnosis.  She does report however that she is on no medications for this issue and actually does not have a rheumatologist for management of this issue.  She reports that she occasionally will have some flareups which include skin lesions and joint stiffness.  I discussed with patient that it would be reasonable to omit radiation therapy given her diagnosis of intermediate grade ductal carcinoma with clear margins assuming she proceeds with adjuvant hormonal therapy, particularly in the setting of systemic lupus.   I will investigate to see if there have been any recent updates concerning radiation complications in the setting of systemic lupus and also discuss with my radiation therapy colleagues.  At this time the patient is undecided whether she will proceed with radiation therapy. If she does not proceed with radiation therapy then we will get her into see Dr. Chryl Heck to initiate adjuvant hormonal therapy.  Plan: Treatment plan pending at this time.  -----------------------------------  Blair Promise, PhD, MD  This document serves as a record of services personally performed by Gery Pray, MD. It was created on  his behalf by Roney Mans, a trained medical scribe. The creation of this record is based on the scribe's personal observations and the provider's statements to them. This document has been checked and approved by the attending provider.

## 2022-10-15 ENCOUNTER — Ambulatory Visit: Payer: No Typology Code available for payment source | Admitting: Radiation Oncology

## 2022-10-15 ENCOUNTER — Other Ambulatory Visit: Payer: Self-pay

## 2022-10-15 ENCOUNTER — Ambulatory Visit
Admission: RE | Admit: 2022-10-15 | Discharge: 2022-10-15 | Disposition: A | Discharge status: Home / Self Care | Payer: No Typology Code available for payment source | Source: Ambulatory Visit | Attending: Radiation Oncology | Admitting: Radiation Oncology

## 2022-10-15 ENCOUNTER — Encounter: Payer: Self-pay | Admitting: Radiation Oncology

## 2022-10-15 VITALS — BP 141/82 | HR 77 | Temp 96.2°F | Resp 18 | Ht 67.0 in | Wt 214.2 lb

## 2022-10-15 DIAGNOSIS — Z170 Estrogen receptor positive status [ER+]: Secondary | ICD-10-CM | POA: Insufficient documentation

## 2022-10-15 DIAGNOSIS — Z7982 Long term (current) use of aspirin: Secondary | ICD-10-CM | POA: Insufficient documentation

## 2022-10-15 DIAGNOSIS — Z79899 Other long term (current) drug therapy: Secondary | ICD-10-CM | POA: Insufficient documentation

## 2022-10-15 DIAGNOSIS — Z79624 Long term (current) use of inhibitors of nucleotide synthesis: Secondary | ICD-10-CM | POA: Insufficient documentation

## 2022-10-15 DIAGNOSIS — D0512 Intraductal carcinoma in situ of left breast: Secondary | ICD-10-CM

## 2022-10-23 ENCOUNTER — Encounter: Payer: Self-pay | Admitting: *Deleted

## 2022-11-06 ENCOUNTER — Encounter: Payer: Self-pay | Admitting: *Deleted

## 2022-11-06 ENCOUNTER — Inpatient Hospital Stay: Payer: Commercial Managed Care - PPO | Admitting: Hematology and Oncology

## 2022-11-08 ENCOUNTER — Telehealth: Payer: Self-pay

## 2022-11-08 NOTE — Telephone Encounter (Signed)
Reached out to patient to ask if she decided to receive radiation treatment or do hormone therapy. Patient states she has a follow up appointment with Dr. Charlann Noss in January 2024 to start hormone therapy.

## 2022-11-21 ENCOUNTER — Inpatient Hospital Stay: Payer: Commercial Managed Care - PPO | Attending: Hematology and Oncology | Admitting: Hematology and Oncology

## 2022-11-21 ENCOUNTER — Other Ambulatory Visit (HOSPITAL_COMMUNITY): Payer: Self-pay

## 2022-11-21 VITALS — BP 154/79 | HR 81 | Temp 97.8°F | Resp 16 | Ht 67.0 in | Wt 212.3 lb

## 2022-11-21 DIAGNOSIS — D0512 Intraductal carcinoma in situ of left breast: Secondary | ICD-10-CM | POA: Diagnosis not present

## 2022-11-21 DIAGNOSIS — Z923 Personal history of irradiation: Secondary | ICD-10-CM | POA: Diagnosis not present

## 2022-11-21 DIAGNOSIS — Z170 Estrogen receptor positive status [ER+]: Secondary | ICD-10-CM | POA: Diagnosis not present

## 2022-11-21 DIAGNOSIS — Z7982 Long term (current) use of aspirin: Secondary | ICD-10-CM | POA: Diagnosis not present

## 2022-11-21 DIAGNOSIS — Z79810 Long term (current) use of selective estrogen receptor modulators (SERMs): Secondary | ICD-10-CM | POA: Diagnosis not present

## 2022-11-21 DIAGNOSIS — R232 Flushing: Secondary | ICD-10-CM | POA: Insufficient documentation

## 2022-11-21 DIAGNOSIS — Z79624 Long term (current) use of inhibitors of nucleotide synthesis: Secondary | ICD-10-CM | POA: Diagnosis not present

## 2022-11-21 DIAGNOSIS — K219 Gastro-esophageal reflux disease without esophagitis: Secondary | ICD-10-CM | POA: Diagnosis not present

## 2022-11-21 DIAGNOSIS — E119 Type 2 diabetes mellitus without complications: Secondary | ICD-10-CM | POA: Diagnosis not present

## 2022-11-21 DIAGNOSIS — I341 Nonrheumatic mitral (valve) prolapse: Secondary | ICD-10-CM | POA: Insufficient documentation

## 2022-11-21 DIAGNOSIS — E785 Hyperlipidemia, unspecified: Secondary | ICD-10-CM | POA: Diagnosis not present

## 2022-11-21 DIAGNOSIS — M329 Systemic lupus erythematosus, unspecified: Secondary | ICD-10-CM | POA: Diagnosis not present

## 2022-11-21 DIAGNOSIS — I10 Essential (primary) hypertension: Secondary | ICD-10-CM | POA: Diagnosis not present

## 2022-11-21 DIAGNOSIS — D573 Sickle-cell trait: Secondary | ICD-10-CM | POA: Insufficient documentation

## 2022-11-21 DIAGNOSIS — Z90710 Acquired absence of both cervix and uterus: Secondary | ICD-10-CM | POA: Diagnosis not present

## 2022-11-21 DIAGNOSIS — R011 Cardiac murmur, unspecified: Secondary | ICD-10-CM | POA: Diagnosis not present

## 2022-11-21 DIAGNOSIS — Z79899 Other long term (current) drug therapy: Secondary | ICD-10-CM | POA: Diagnosis not present

## 2022-11-21 MED ORDER — TAMOXIFEN CITRATE 20 MG PO TABS
20.0000 mg | ORAL_TABLET | Freq: Every day | ORAL | 3 refills | Status: DC
  Filled 2022-11-21: qty 90, 90d supply, fill #0
  Filled 2023-02-19: qty 90, 90d supply, fill #1
  Filled 2023-05-19: qty 90, 90d supply, fill #2
  Filled 2023-08-21: qty 90, 90d supply, fill #3

## 2022-11-21 MED ORDER — VENLAFAXINE HCL ER 37.5 MG PO CP24
37.5000 mg | ORAL_CAPSULE | Freq: Every day | ORAL | 0 refills | Status: DC
  Filled 2022-11-21: qty 90, 90d supply, fill #0

## 2022-11-21 NOTE — Progress Notes (Unsigned)
[place] NOTE  Patient Care Team: Kelton Pillar, MD as PCP - General (Family Medicine) Josue Hector, MD as PCP - Cardiology (Cardiology) Rockwell Germany, RN as Oncology Nurse Navigator Mauro Kaufmann, RN as Oncology Nurse Navigator Donnie Mesa, MD as Consulting Physician (General Surgery) Benay Pike, MD as Consulting Physician (Hematology and Oncology) Gery Pray, MD as Consulting Physician (Radiation Oncology)  CHIEF COMPLAINTS/PURPOSE OF CONSULTATION:  Newly diagnosed breast cancer  HISTORY OF PRESENTING ILLNESS:  Vickie Lane 62 y.o. female is here because of recent diagnosis of left breast DCIS  I reviewed her records extensively and collaborated the history with the patient.  SUMMARY OF ONCOLOGIC HISTORY: Oncology History  Ductal carcinoma in situ (DCIS) of left breast  07/27/2022 Mammogram   Bilateral screening mammogram showed left breast calcifications warranting further evaluation.  No findings suspicious for malignancy in the right breast.  Diagnostic mammogram of the left breast confirmed 1.2 x 0.8 x 1.1 cm hypoechoic circumscribed mass in the left breast at 2:00, no hilar fat or convincing hilar blood flow.  Ultrasound showed several normal lymph nodes.  No enlarged or abnormal left axillary lymph nodes.   08/21/2022 Pathology Results   Pathology from the left breast needle core biopsy showed intermediate nuclear grade DCIS, necrosis present, calcs present, ER 95% positive strong staining PR 40% positive strong staining   08/31/2022 Initial Diagnosis   Ductal carcinoma in situ (DCIS) of left breast   09/10/2022 Genetic Testing   Negative [facility] CustomNext-Cancer +RNAinsight Panel.  Report date is 09/23/2022.    The CustomNext-Cancer+RNAinsight panel offered by [facility] includes sequencing and rearrangement analysis for the following 50 genes:  APC, ATM, BAP1, BARD1, BMPR1A, BRCA1, BRCA2, BRIP1, CDH1, CDK4,  CDKN2A, CHEK2, DICER1, MEN1, MLH1, MSH2, MSH6, MUTYH, NBN, NF1, NF2, NTHL1, PALB2, PMS2, POT1, PTEN, RAD51C, RAD51D, RECQL, RET, SDHA, SDHAF2, SDHB, SDHC, SDHD, SMAD4, SMARCA4, STK11, TP53, TSC1, TSC2 and VHL (sequencing and deletion/duplication); AXIN2, HOXB13, MITF, MSH3, POLD1 and POLE (sequencing only); EPCAM and GREM1 (deletion/duplication only). RNA data is routinely analyzed for use in variant interpretation for all genes.       She had left breast lumpectomy since last visit which showed DCIS, intermediate grade, 1.5 cm, margins are negative, prior prognostic showed ER 95% positive PR 40% positive  She is also on estrogen patch and has been on it for almost 20 years.  About 20 years ago she had a hysterectomy, no oophorectomy and apparently had severe hot flashes and hence was started on estrogen supplementation.    Interval History  Since last visit, she underwent left breast lumpectomy, healed very well so far.   She discontinued estrogen patches and is now going through severe hot flashes both day and night which have been causing some insomnia.   Other than the hot flashes and insomnia, she is scheduled to see Dr. Randa Ngo at the end of this month, has a vacation planned in mid December hence may start radiation after that vacation. Rest of the pertinent 10 point ROS reviewed and negative  MEDICAL HISTORY:  Past Medical History:  Diagnosis Date   Allergy 1990   Arthritis 2011   "back" (11/07/2012)   Breast cancer ([place])    Chest pain    Chronic lower back pain    Constipation    GERD (gastroesophageal reflux disease) 1991   Heart murmur    "MVP" (`11/07/2012)   History of blood transfusion 1990's   "? w/one of my deliveries" (11/07/2012)  Hyperlipidemia 2019   LDL   Hypertension 1993   Joint pain 2011   osteo arthritis   Kidney problem 2008   Lactose intolerance    Mitral valve prolapse    Seizures ([place]) 1991   "related to pregnancy; I had HELLP" (11/07/2012)-  lastseizure was 1991 per pt    Sickle cell trait ([place])    Systemic lupus erythematosus ([place]) 1992   joint pain   Type II diabetes mellitus ([place]) 08/2011   Diet and exercise.    SURGICAL HISTORY: Past Surgical History:  Procedure Laterality Date   ABDOMINAL HYSTERECTOMY  1997   BREAST LUMPECTOMY WITH RADIOACTIVE SEED LOCALIZATION Left 09/18/2022   Procedure: LEFT BREAST LUMPECTOMY WITH RADIOACTIVE SEED LOCALIZATION;  Surgeon: Donnie Mesa, MD;  Location: [facility];  Service: General;  Laterality: Left;   CARPAL TUNNEL RELEASE  2008   "right w/fracture OR" (11/07/2012)   [place]  11/07/2012   "L4-5" (11/07/2012)   TRIGGER FINGER RELEASE Right 05/04/2016   Procedure: MINOR RELEASE TRIGGER FINGER/A-1 PULLEY;  Surgeon: Charlotte Crumb, MD;  Location: [facility];  Service: Orthopedics;  Laterality: Right;   WRIST FRACTURE SURGERY  2008   "right" (11/07/2012)    SOCIAL HISTORY: Social History   Socioeconomic History   Marital status: Single    Spouse name: Not on file   Number of children: Not on file   Years of education: Not on file   Highest education level: Not on file  Occupational History   Occupation: Lawyer: [employer]  Tobacco Use   Smoking status: Never   Smokeless tobacco: Never  Vaping Use   Vaping Use: Never used  Substance and Sexual Activity   Alcohol use: Yes    Comment: 11/07/2012 "once/month might have a glass of wine"   Drug use: No   Sexual activity: Yes    Birth control/protection: Surgical  Other Topics Concern   Not on file  Social History Narrative   Not on file   Social Determinants of Health   Financial Resource Strain: Low Risk  (09/05/2022)   Overall Financial Resource Strain (CARDIA)    Difficulty of Paying Living Expenses: Not hard at all  Food Insecurity: No Food Insecurity (09/05/2022)   Hunger Vital  Sign    Worried About Running Out of Food in the Last Year: Never true    [place] in the Last Year: Never true  Transportation Needs: No Transportation Needs (09/05/2022)   PRAPARE - Hydrologist (Medical): No    Lack of Transportation (Non-Medical): No  Physical Activity: Sufficiently Active (10/03/2021)   Exercise Vital Sign    Days of Exercise per Week: 4 days    Minutes of Exercise per Session: 50 min  Stress: Not on file  Social Connections: Not on file  Intimate Partner Violence: Not on file    FAMILY HISTORY: Family History  Problem Relation Age of Onset   Hypertension Mother    Hypertension Father    Heart attack Father    Sudden death Father    Cancer Paternal Aunt        GYN (uterine/cervical); d. mid 10s   Heart disease Other    Hypertension Other    Colon cancer Neg Hx    Colon polyps Neg Hx    Rectal cancer Neg Hx  Stomach cancer Neg Hx    Esophageal cancer Neg Hx     ALLERGIES:  is allergic to amlodipine, amlodipine besylate, ciprofloxacin, codeine, hydrocodone, levofloxacin, ultram [tramadol hcl], and hydrocodone-acetaminophen.  MEDICATIONS:  Current Outpatient Medications  Medication Sig Dispense Refill   aspirin EC 81 MG tablet Take 81 mg by mouth every morning.     b complex vitamins capsule Take 1 capsule by mouth daily.     carvedilol (COREG) 6.25 MG tablet Take 1 tablet (6.25 mg total) by mouth 2 (two) times daily. 180 tablet 3   Cetirizine-Pseudoephedrine (ZYRTEC-D PO) Take 1 tablet by mouth daily as needed. For allergies     chlorthalidone (HYGROTON) 25 MG tablet Take 1 tablet (25 mg total) by mouth daily. 90 tablet 3   Cholecalciferol (VITAMIN D3 PO) Take 125 mcg by mouth daily.     esomeprazole (NEXIUM) 20 MG packet Take 20 mg by mouth as needed.     FREESTYLE LITE test strip   11   gabapentin (NEURONTIN) 300 MG capsule Take 1 capsule (300 mg total) by mouth at bedtime. 60 capsule 0   Lancets (FREESTYLE)  lancets   12   MAGNESIUM GLYCINATE PO Take by mouth.     meloxicam (MOBIC) 7.5 MG tablet Take 7.5 mg by mouth daily as needed for pain.     Multiple Vitamin (MULTIVITAMIN WITH MINERALS) TABS Take 1 tablet by mouth daily.     simvastatin (ZOCOR) 40 MG tablet Take 1 tablet (40 mg total) by mouth at bedtime. 90 tablet 3   spironolactone (ALDACTONE) 25 MG tablet Take 1 tablet (25 mg total) by mouth daily. 90 tablet 1   valACYclovir (VALTREX) 500 MG tablet Take 1 tablet (500 mg total) by mouth daily. 30 tablet 11   valsartan (DIOVAN) 320 MG tablet Take 1 tablet (320 mg total) by mouth daily. 90 tablet 3   vitamin B-12 (CYANOCOBALAMIN) 1000 MCG tablet Take 5,000 mcg by mouth daily.     No current facility-administered medications for this visit.    REVIEW OF SYSTEMS:   Constitutional: Denies fevers, chills or abnormal night sweats Eyes: Denies blurriness of vision, double vision or watery eyes Ears, nose, mouth, throat, and face: Denies mucositis or sore throat Respiratory: Denies cough, dyspnea or wheezes Cardiovascular: Denies palpitation, chest discomfort or lower extremity swelling Gastrointestinal:  Denies nausea, heartburn or change in bowel habits Skin: Denies abnormal skin rashes Lymphatics: Denies new lymphadenopathy or easy bruising Neurological:Denies numbness, tingling or new weaknesses Behavioral/Psych: Mood is stable, no new changes  Breast: Denies any palpable lumps or discharge All other systems were reviewed with the patient and are negative.  PHYSICAL EXAMINATION: ECOG PERFORMANCE STATUS: 0 - Asymptomatic  Vitals:   11/21/22 1453  BP: (!) 154/79  Pulse: 81  Resp: 16  Temp: 97.8 F (36.6 C)  SpO2: 100%   Filed Weights   11/21/22 1453  Weight: 212 lb 4.8 oz (96.3 kg)    GENERAL:alert, no distress and comfortable Left breast healing well, Steri-Strips in place.  LABORATORY DATA:  I have reviewed the data as listed Lab Results  Component Value Date   WBC 5.8  09/05/2022   HGB 13.0 09/05/2022   HCT 39.2 09/05/2022   MCV 83.9 09/05/2022   PLT 277 09/05/2022   Lab Results  Component Value Date   NA 137 09/05/2022   K 4.1 09/05/2022   CL 103 09/05/2022   CO2 28 09/05/2022    RADIOGRAPHIC STUDIES: I have personally reviewed the radiological reports  and agreed with the findings in the report.  ASSESSMENT AND PLAN:  Ductal carcinoma in situ (DCIS) of left breast This is a very pleasant 62 year old female patient with newly diagnosed left breast DCIS, ER/PR positive referred to breast [facility] for recommendations.  She is now status post left breast lumpectomy here for adjuvant recommendations.  She will be following up with Dr. Sondra Come soon for adjuvant radiation.   We have reviewed final pathology which confirmed DCIS, no invasive cancer, negative margins.  She is leaning towards tamoxifen for antiestrogen therapy after completion of radiation.  She is also having severe hot flashes since she discontinued the hormone supplementation.  We have today discussed about options for treatment including Effexor, gabapentin, clonidine versus oxybutynin and some newer therapies.  She is leaning towards gabapentin since this will also help her pain.  We started her on 300 mg nightly for management of vasomotor symptoms and if well-tolerated and hot flashes are not as well controlled, she can try doubling up on the dose to 600 mg nightly. She expressed understanding of all the recommendations.  She will return to clinic after completion of radiation or sooner as needed.  Postop site appears to be healing well without any concerns today.   Total time spent: 30 min including history, physical exam, review of records, counseling and coordination of care  All questions were answered. The patient knows to call the clinic with any problems, questions or concerns.    Benay Pike, MD 11/21/22

## 2022-11-21 NOTE — Assessment & Plan Note (Signed)
This is a very pleasant 62 year old female patient with newly diagnosed left breast DCIS, ER/PR positive referred to breast [facility] for recommendations.  She is now status post left breast lumpectomy here for adjuvant recommendations.  She will be following up with Dr. Roselind Messier soon for adjuvant radiation.   We have reviewed final pathology which confirmed DCIS, no invasive cancer, negative margins.  She is leaning towards tamoxifen for antiestrogen therapy after completion of radiation.  She is now post radiation, tolerated it well.  She is here to initiate antiestrogen therapy.  We have once again discussed about options for antiestrogen therapy including tamoxifen versus aromatase inhibitors, mechanism of action with each class and adverse effects with each class. She also did not notice much response to gabapentin hence we have discussed about considering Effexor 37.5 mg extended release once daily for management of hot flashes. She is hoping to try tamoxifen, this has been dispensed to the pharmacy of her choice.  She understands the adverse effects including but not limited to postmenopausal symptoms, risk of DVT/PE, endometrial hyperplasia and rarely endometrial cancer.  A benefit from tamoxifen would be improvement in bone density. She will return to clinic in 3 months for toxicity check.

## 2022-11-22 ENCOUNTER — Other Ambulatory Visit (HOSPITAL_COMMUNITY): Payer: Self-pay

## 2022-11-23 ENCOUNTER — Encounter: Payer: Self-pay | Admitting: *Deleted

## 2022-11-23 DIAGNOSIS — D0512 Intraductal carcinoma in situ of left breast: Secondary | ICD-10-CM

## 2022-12-06 ENCOUNTER — Encounter (HOSPITAL_COMMUNITY): Payer: Self-pay

## 2022-12-14 NOTE — Progress Notes (Incomplete)
Advanced Hypertension Clinic Follow-up:    Date:  12/18/2022   ID:  Vickie Lane, DOB 17-Sep-1961, MRN [id number]  PCP:  Kelton Pillar, MD  Cardiologist:  Jenkins Rouge, MD  Nephrologist:  Referring MD: Kelton Pillar, MD   CC: Hypertension  History of Present Illness:    Vickie Lane is a 62 y.o. female with a hx of hypertension, hyperlipidemia, non-obstructive CAD, CKD stage 2, mitral valve prolapse, sickle cell trait, diabetes, and GERD here for follow-up. She was initially seen 10/03/2021 to establish care in the Advanced Hypertension Clinic.   She last saw Laurann Montana, NP on 08/08/21 for hypertension. Her blood pressure was 186/120. She reported having high blood pressure since she was 62 years old which was worsened with pregnancy and HELLP syndrome. Benazepril was switched to Benicar and Amlodipine was discontinued due to LE edema. At that visit, Olmesartan was discontinued and '320mg'$  Valsartan daily was started. Clonidine 0.'2mg'$  twice daily was continued. Her blood pressure has been difficult to control over the years so she was referred to the hypertension clinic. She underwent cardiac CTA in 2017 with a calcium score of 34 placing her in 90th percentile for age and sex matched control. She had less than 50% disease which was non-obstructive in proximal LAD and D2 with <30% calcific disease in proximal and mid RCA. Echo from 08/2018 revealed normal function with LVEF 55-60%. Echo from 07/2021 showed no significant changes.   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 Her blood pressure issues began with her first pregnancy at 62 years old causing her to deliver early. Her second child also had to be delivered at [redacted] weeks gestation for similar reasons. Her children are 39 and 24 years old currently. She noted her blood pressure was difficult to control over the preceding year. Hydralazine was added to her regimen. She was referred to [facility] and enrolled in our  remote patient monitoring study. Her renin and aldosterone levels were indeterminate. CTA of the abdomen was unremarkable. She had minimal aortic atherosclerosis. She saw Tommy Medal, PharmD 11/2021 and noted feeling poorly when her BP was below 110. She also had been working with the [facility], and was started on [place]. Her clonidine dose was reduced. Her home blood pressures have remained well controlled on remote patient monitoring.  At her last appointment, her blood pressure was better controlled. She was concerned regarding heart rates over 100 bpm while semi-active. Personally reviewed EKG tracings from her smart watch, some of which did show sinus tachycardia. Her hydralazine was switched to carvedilol 12.5 mg BID. However, she reported low blood pressures (93/61) in the AM associated with dizziness. It was recommended to reduce carvedilol to 6.25 mg BID. She followed up with our pharmacist 03/13/2022 where she was feeling better on the reduced dose. Her blood pressure was 137/88. No med changes were made. 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 Margins were clear.   Today,  She denies any palpitations, chest pain, shortness of breath, or peripheral edema. No lightheadedness, headaches, syncope, orthopnea, or PND.  (+)  Past Medical History:  Diagnosis Date   Allergy 1990   Arthritis 2011   "back" (11/07/2012)   Breast cancer (HCC)    Chest pain    Chronic lower back pain    Constipation    GERD (gastroesophageal reflux disease) 1991   Heart murmur    "MVP" (`11/07/2012)  History of blood transfusion 1990's   "? w/one of my deliveries" (11/07/2012)   Hyperlipidemia 2019   LDL   Hypertension 1993   Joint pain 2011   osteo arthritis   Kidney problem 2008   Lactose intolerance    Mitral valve prolapse    Seizures ([place]) 1991   "related to pregnancy; I had HELLP" (11/07/2012)-  lastseizure was 1991 per pt    Sickle cell trait ([place])    Systemic lupus erythematosus ([place]) 1992   joint pain   Type II diabetes mellitus ([place]) 08/2011   Diet and exercise.    Past Surgical History:  Procedure Laterality Date   ABDOMINAL HYSTERECTOMY  1997   BREAST LUMPECTOMY WITH RADIOACTIVE SEED LOCALIZATION Left 09/18/2022   Procedure: LEFT BREAST LUMPECTOMY WITH RADIOACTIVE SEED LOCALIZATION;  Surgeon: Donnie Mesa, MD;  Location: [facility];  Service: General;  Laterality: Left;   CARPAL TUNNEL RELEASE  2008   "right w/fracture OR" (11/07/2012)   [place]  11/07/2012   "L4-5" (11/07/2012)   TRIGGER FINGER RELEASE Right 05/04/2016   Procedure: MINOR RELEASE TRIGGER FINGER/A-1 PULLEY;  Surgeon: Charlotte Crumb, MD;  Location: [facility];  Service: Orthopedics;  Laterality: Right;   WRIST FRACTURE SURGERY  2008   "right" (11/07/2012)    Current Medications: No outpatient medications have been marked as taking for the 12/18/22 encounter (Appointment) with Skeet Latch, MD.     Allergies:   Amlodipine, Amlodipine besylate, Ciprofloxacin, Codeine, Hydrocodone, Levofloxacin, Ultram [tramadol hcl], and Hydrocodone-acetaminophen   Social History   Socioeconomic History   Marital status: Single    Spouse name: Not on file   Number of children: Not on file   Years of education: Not on file   Highest education level: Not on file  Occupational History   Occupation: Lawyer: [employer]  Tobacco Use   Smoking status: Never   Smokeless tobacco: Never  Vaping Use   Vaping Use: Never used  Substance and Sexual Activity   Alcohol use: Yes    Comment: 11/07/2012 "once/month might have a glass of wine"   Drug use: No   Sexual activity: Yes    Birth control/protection: Surgical  Other Topics Concern   Not on file  Social History Narrative    Not on file   Social Determinants of Health   Financial Resource Strain: Low Risk  (09/05/2022)   Overall Financial Resource Strain (CARDIA)    Difficulty of Paying Living Expenses: Not hard at all  Food Insecurity: No Food Insecurity (09/05/2022)   Hunger Vital Sign    Worried About Running Out of Food in the Last Year: Never true    [place] in the Last Year: Never true  Transportation Needs: No Transportation Needs (09/05/2022)   PRAPARE - Hydrologist (Medical): No    Lack of Transportation (Non-Medical): No  Physical Activity: Sufficiently Active (10/03/2021)   Exercise Vital Sign    Days of Exercise per Week: 4 days    Minutes of Exercise per Session: 50 min  Stress: Not on file  Social Connections: Not on file     Family History: The patient's family history includes Cancer in her paternal aunt; Heart attack in her father; Heart disease in an other family member; Hypertension in her father, mother, and another family member; Sudden death  in her father. There is no history of Colon cancer, Colon polyps, Rectal cancer, Stomach cancer, or Esophageal cancer.  ROS:   Please see the history of present illness.    All other systems reviewed and are negative.  EKGs/Labs/Other Studies Reviewed:    Bilateral Renal Artery Dopplers 10/24/2021: Summary:  Largest Aortic Diameter: 2.4 cm     Renal:     Right: Normal size right kidney. Normal right Resisitive Index.         Normal cortical thickness of right kidney. No evidence of         right renal artery stenosis. RRV flow present.  Left:  Normal size of left kidney. Normal left Resistive Index.         Normal cortical thickness of the left kidney. No evidence of         left renal artery stenosis. LRV flow present.  Mesenteric:  Normal Celiac artery and Superior Mesenteric artery findings.   Echo 08/16/21 1. Left ventricular ejection fraction, by estimation, is 55 to 60%. The  left  ventricle has normal function. The left ventricle has no regional  wall motion abnormalities. Left ventricular diastolic parameters are  indeterminate.   2. Right ventricular systolic function is normal. The right ventricular  size is normal. There is normal pulmonary artery systolic pressure. The  estimated right ventricular systolic pressure is 54.0 mmHg.   3. The mitral valve is grossly normal. Trivial mitral valve  regurgitation. No evidence of mitral stenosis.   4. The aortic valve is tricuspid. Aortic valve regurgitation is not  visualized. No aortic stenosis is present.   5. The inferior vena cava is normal in size with greater than 50%  respiratory variability, suggesting right atrial pressure of 3 mmHg.  Comparison(s): No significant change from prior study.   Nuclear Stress Test 09/17/18 Nuclear stress EF: 55%. There was no ST segment deviation noted during stress. The study is normal. This is a low risk study. The left ventricular ejection fraction is normal (55-65%). Normal perfusion EF 55% Baseline ECG with inferolateral T wave changes deemed non diagnostic during stress  Echo 09/17/18 - Left ventricle: The cavity size was normal. Wall thickness was normal. Systolic function was normal. The estimated ejection fraction was in the range of 55% to 60%. Left ventricular diastolic function parameters were normal.  - Aortic valve: Sclerosis without stenosis.  - Mitral valve: Calcified annulus. Mildly thickened leaflets .  - Left atrium: The atrium was mildly dilated.  - Atrial septum: No defect or patent foramen ovale was identified.  - Pulmonary arteries: PA peak pressure: 32 mm Hg (S).  CT Coronary Morph with Ca Score 04/04/16 IMPRESSION: 1) Calcium Score 40 90th percentile for age and sex matched controls 2) Less than 50% calcific non obstructive disease in proximal LAD and D2 with less than 30% calcific disease in proximal and mid RCA 3) Normal aortic  root  FINDINGS: Within the visualized portions of the thorax there are no suspicious appearing pulmonary nodules or masses, there is no acute consolidative airspace disease, no pleural effusions, no pneumothorax, and no lymphadenopathy. Visualized portions of the upper abdomen are unremarkable. There are no aggressive appearing lytic or blastic lesions noted in the visualized portions of the skeleton. IMPRESSION: 1. No significant incidental noncardiac findings are noted.   EKG:  EKG is personally reviewed. 12/18/2022:  EKG was not ordered. 02/07/2022: EKG was not ordered. 08/08/2021 Laurann Montana, NP): NSR 78 bpm with stable inferolateral T wave changes.  Recent Labs: 09/05/2022: ALT 15; BUN 21; Creatinine 1.25; Hemoglobin 13.0; Platelet Count 277; Potassium 4.1; Sodium 137   Recent Lipid Panel    Component Value Date/Time   CHOL 170 03/29/2016 0847   TRIG 41 03/29/2016 0847   HDL 83 03/29/2016 0847   CHOLHDL 2.0 03/29/2016 0847   VLDL 8 03/29/2016 0847   LDLCALC 79 03/29/2016 0847    Physical Exam:    VS:  There were no vitals taken for this visit. , BMI There is no height or weight on file to calculate BMI. GENERAL:  Well appearing HEENT: Pupils equal round and reactive, fundi not visualized, oral mucosa unremarkable NECK:  No jugular venous distention, waveform within normal limits, carotid upstroke brisk and symmetric, no bruits, no thyromegaly LUNGS:  Clear to auscultation bilaterally HEART:  RRR.  PMI not displaced or sustained,S1 and S2 within normal limits, no S3, no S4, no clicks, no rubs, no murmurs ABD:  Flat, positive bowel sounds normal in frequency in pitch, no bruits, no rebound, no guarding, no midline pulsatile mass, no hepatomegaly, no splenomegaly EXT:  2 plus pulses throughout, no edema, no cyanosis no clubbing SKIN:  No rashes no nodules NEURO:  Cranial nerves II through XII grossly intact, motor grossly intact throughout PSYCH:  Cognitively intact,  oriented to person place and time   ASSESSMENT/PLAN:    No problem-specific Assessment & Plan notes found for this encounter.   Screening for Secondary Hypertension:     10/03/2021    4:19 PM 02/07/2022    9:47 AM  Causes  Drugs/Herbals Screened Screened     - Comments limits sodium. no caffeine.  Rare EtOH.  No more NSAIDS   Renovascular HTN Screened Screened     - Comments Check renal artery Dopplers No RAS on ultrasound or CT  Sleep Apnea Screened   Thyroid Disease Screened Screened  Hyperaldosteronism Screened Screened     - Comments Check renin and aldosterone high ratio but no adenomas/hyperplasia on CT  Pheochromocytoma N/A Screened  Cushing's Syndrome N/A N/A  Hyperparathyroidism Screened Screened  Coarctation of the Aorta Screened Screened     - Comments BP symmetric BP symmetric  Compliance Screened Screened    Relevant Labs/Studies:    Latest Ref Rng & Units 09/05/2022    8:16 AM 10/27/2021    2:35 PM 09/13/2021   11:39 AM  Basic Labs  Sodium 135 - 145 mmol/L 137  143  140   Potassium 3.5 - 5.1 mmol/L 4.1  4.0  4.3   Creatinine 0.44 - 1.00 mg/dL 1.25  1.03  1.04        Latest Ref Rng & Units 08/08/2021    4:14 PM  Thyroid   TSH 0.450 - 4.500 uIU/mL 1.820        Latest Ref Rng & Units 10/09/2021    8:04 AM  Renin/Aldosterone   Aldosterone 0.0 - 30.0 ng/dL 18.6   Renin 0.167 - 5.380 ng/mL/hr 0.272   Aldos/Renin Ratio 0.0 - 30.0 68.4              10/24/2021   10:08 AM  Renovascular   Renal Artery [place] Completed Yes        Disposition:    FU with PharmD in 1 month. FU with Tiffany C. Oval Linsey, MD, [facility] in ***7 months    Medication Adjustments/Labs and Tests Ordered: Current medicines are reviewed at length with the patient today.  Concerns regarding medicines are outlined above.   No orders of  the defined types were placed in this encounter.  No orders of the defined types were placed in this encounter.  I,Mathew Stumpf,acting as a Education administrator  for Skeet Latch, MD.,have documented all relevant documentation on the behalf of Skeet Latch, MD,as directed by  Skeet Latch, MD while in the presence of Skeet Latch, MD.  I, Hardt[place] Oval Linsey, MD have reviewed all documentation for this visit.  The documentation of the exam, diagnosis, procedures, and orders on 12/18/2022 are all accurate and complete.  Waynetta Pean  12/18/2022 8:01 AM    [place]

## 2022-12-18 ENCOUNTER — Ambulatory Visit (HOSPITAL_BASED_OUTPATIENT_CLINIC_OR_DEPARTMENT_OTHER): Payer: Self-pay | Admitting: Cardiovascular Disease

## 2022-12-24 ENCOUNTER — Ambulatory Visit (HOSPITAL_BASED_OUTPATIENT_CLINIC_OR_DEPARTMENT_OTHER): Payer: Commercial Managed Care - PPO | Admitting: Family

## 2022-12-24 ENCOUNTER — Encounter (HOSPITAL_BASED_OUTPATIENT_CLINIC_OR_DEPARTMENT_OTHER): Payer: Self-pay | Admitting: Family

## 2022-12-24 ENCOUNTER — Other Ambulatory Visit (HOSPITAL_COMMUNITY): Payer: Self-pay

## 2022-12-24 VITALS — BP 128/78 | HR 77 | Ht 67.0 in | Wt 205.0 lb

## 2022-12-24 DIAGNOSIS — E785 Hyperlipidemia, unspecified: Secondary | ICD-10-CM | POA: Diagnosis not present

## 2022-12-24 DIAGNOSIS — I10 Essential (primary) hypertension: Secondary | ICD-10-CM | POA: Diagnosis not present

## 2022-12-24 DIAGNOSIS — I25118 Atherosclerotic heart disease of native coronary artery with other forms of angina pectoris: Secondary | ICD-10-CM

## 2022-12-24 DIAGNOSIS — E782 Mixed hyperlipidemia: Secondary | ICD-10-CM | POA: Diagnosis not present

## 2022-12-24 MED ORDER — CHLORTHALIDONE 25 MG PO TABS
25.0000 mg | ORAL_TABLET | Freq: Every day | ORAL | 3 refills | Status: DC
  Filled 2022-12-24 – 2023-02-19 (×2): qty 90, 90d supply, fill #0
  Filled 2023-05-19: qty 90, 90d supply, fill #1

## 2022-12-24 MED ORDER — SIMVASTATIN 40 MG PO TABS
40.0000 mg | ORAL_TABLET | Freq: Every day | ORAL | 3 refills | Status: DC
  Filled 2022-12-24: qty 90, 90d supply, fill #0
  Filled 2023-04-03: qty 90, 90d supply, fill #1
  Filled 2023-06-27: qty 90, 90d supply, fill #2
  Filled 2023-09-30: qty 90, 90d supply, fill #3

## 2022-12-24 MED ORDER — VALSARTAN 320 MG PO TABS
320.0000 mg | ORAL_TABLET | Freq: Every day | ORAL | 3 refills | Status: DC
  Filled 2022-12-24: qty 90, 90d supply, fill #0
  Filled 2023-04-03: qty 90, 90d supply, fill #1
  Filled 2023-07-15: qty 90, 90d supply, fill #2

## 2022-12-24 MED ORDER — CARVEDILOL 6.25 MG PO TABS
6.2500 mg | ORAL_TABLET | Freq: Two times a day (BID) | ORAL | 3 refills | Status: DC
  Filled 2022-12-24: qty 180, 90d supply, fill #0
  Filled 2023-04-18: qty 180, 90d supply, fill #1
  Filled 2023-07-15: qty 180, 90d supply, fill #2

## 2022-12-24 MED ORDER — SPIRONOLACTONE 25 MG PO TABS
25.0000 mg | ORAL_TABLET | Freq: Every day | ORAL | 3 refills | Status: DC
  Filled 2022-12-24: qty 90, 90d supply, fill #0
  Filled 2023-04-03: qty 90, 90d supply, fill #1
  Filled 2023-06-27: qty 90, 90d supply, fill #2

## 2022-12-24 NOTE — Patient Instructions (Addendum)
Medication Instructions:  Your Physician recommend you continue on your current medication as directed.    We have refilled your cardiac medications today!   Follow-Up: August 28th at 8:30am with Dr. Oval Linsey at [facility]

## 2022-12-24 NOTE — Progress Notes (Signed)
Advanced Hypertension Clinic Assessment:    Date:  12/24/2022   ID:  Vickie Lane, DOB [birth year], MRN [id number]  PCP:  Kelton Pillar, MD  Cardiologist:  Jenkins Rouge, MD  Nephrologist:  Referring MD: Kelton Pillar, MD   CC: Hypertension  History of Present Illness:    Vickie Lane is a 62 y.o. female with a hx of HLD, aortic atherosclerosis, HTN, nonobstructive CAD, sickle cell trait, GERD, MVP, DM2 managed by diet and exercise. Presents today to follow up with ADV HTN clinic.    She had cardiac CTA in 2017 with calcium score of 34 placing her in 90th percentile for age and sec matched control. She had less than 50% disease which was nonobstructive in prox LAD and D2 with <30% calcific disease in proximal and mid RCA. Echocardiogram 08/2018 normal LVEF 55-60% and no valvular abnormality.   Noted to have pending elevated blood pressure since she was 62 years old which worsens during her pregnancies including HELLP syndrome.  Benazepril previously switched to Benicar.  Amlodipine previously discontinued due to lower extremity edema.  Olmesartan later discontinued and 320 mg of valsartan initiated.  Clonidine 0.2 mg twice daily continued.  Hydralazine later added.  Renal aldosterone levels indeterminate.  CT abdomen unremarkable though did note minimal aortic atherosclerosis.  Saw Dr. Oval Linsey of 02/07/2022 and hydralazine was stopped and transition to carvedilol 12.5 mg twice daily due to tachycardia.  Chlorthalidone, losartan, spironolactone were continued. Saw pharmacy team 03/13/22 and Carvedilol had to be reduced due to hypotension, dizziness.   She presents today for follow up. 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 Since last seen she was diagnosed with breast cancer 08/2022 s/p lumpectomy. She is through with treatments and only on Tamoxifen. She only takes her blood pressure when she gets dizzy and this happens once a week  without near syncope, syncope. She can't exercise due to pain in her left knee and she is afraid she might fall due to this issue. She limits salt intake when cooking and practices intermittent fasting. She was in a bicycle club and is hopeful that when her knee pain improves she will be able to get back to biking.   Reports no shortness of breath nor dyspnea on exertion. Reports no chest pain, pressure, or tightness. No edema, orthopnea, PND. Reports no palpitations.    Previous antihypertensives: Amlodipine-swelling Hydralazine - switched to Carvedilol due to tachycardia  Past Medical History:  Diagnosis Date   Allergy 1990   Arthritis 2011   "back" (11/07/2012)   Breast cancer ([place])    Chest pain    Chronic lower back pain    Constipation    GERD (gastroesophageal reflux disease) 1991   Heart murmur    "MVP" (`11/07/2012)   History of blood transfusion 1990's   "? w/one of my deliveries" (11/07/2012)   Hyperlipidemia 2019   LDL   Hypertension 1993   Joint pain 2011   osteo arthritis   Kidney problem 2008   Lactose intolerance    Mitral valve prolapse    Seizures ([place]) 1991   "related to pregnancy; I had HELLP" (11/07/2012)- lastseizure was 1991 per pt    Sickle cell trait ([place])    Systemic lupus erythematosus ([place]) 1992   joint pain   Type II diabetes mellitus ([place]) 08/2011   Diet and exercise.    Past Surgical History:  Procedure Laterality Date   ABDOMINAL HYSTERECTOMY  1997   BREAST LUMPECTOMY WITH RADIOACTIVE  SEED LOCALIZATION Left 09/18/2022   Procedure: LEFT BREAST LUMPECTOMY WITH RADIOACTIVE SEED LOCALIZATION;  Surgeon: Donnie Mesa, MD;  Location: [facility];  Service: General;  Laterality: Left;   CARPAL TUNNEL RELEASE  2008   "right w/fracture OR" (11/07/2012)   [place]  11/07/2012   "L4-5" (11/07/2012)   TRIGGER FINGER RELEASE Right 05/04/2016   Procedure:  MINOR RELEASE TRIGGER FINGER/A-1 PULLEY;  Surgeon: Charlotte Crumb, MD;  Location: [facility];  Service: Orthopedics;  Laterality: Right;   WRIST FRACTURE SURGERY  2008   "right" (11/07/2012)    Current Medications: Current Meds  Medication Sig   aspirin EC 81 MG tablet Take 81 mg by mouth every morning.   b complex vitamins capsule Take 1 capsule by mouth daily.   carvedilol (COREG) 6.25 MG tablet Take 1 tablet (6.25 mg total) by mouth 2 (two) times daily.   Cetirizine-Pseudoephedrine (ZYRTEC-D PO) Take 1 tablet by mouth daily as needed. For allergies   chlorthalidone (HYGROTON) 25 MG tablet Take 1 tablet (25 mg total) by mouth daily.   Cholecalciferol (VITAMIN D3 PO) Take 125 mcg by mouth daily.   esomeprazole (NEXIUM) 20 MG packet Take 20 mg by mouth as needed.   FREESTYLE LITE test strip    Lancets (FREESTYLE) lancets    MAGNESIUM GLYCINATE PO Take by mouth.   meloxicam (MOBIC) 7.5 MG tablet Take 7.5 mg by mouth daily as needed for pain.   Multiple Vitamin (MULTIVITAMIN WITH MINERALS) TABS Take 1 tablet by mouth daily.   spironolactone (ALDACTONE) 25 MG tablet Take 1 tablet (25 mg total) by mouth daily.   tamoxifen (NOLVADEX) 20 MG tablet Take 1 tablet (20 mg total) by mouth daily.   valACYclovir (VALTREX) 500 MG tablet Take 1 tablet (500 mg total) by mouth daily.   valsartan (DIOVAN) 320 MG tablet Take 1 tablet (320 mg total) by mouth daily.   venlafaxine XR (EFFEXOR-XR) 37.5 MG 24 hr capsule Take 1 capsule (37.5 mg total) by mouth daily with breakfast.   vitamin B-12 (CYANOCOBALAMIN) 1000 MCG tablet Take 5,000 mcg by mouth daily.     Allergies:   Amlodipine, Amlodipine besylate, Ciprofloxacin, Codeine, Hydrocodone, Levofloxacin, Ultram [tramadol hcl], and Hydrocodone-acetaminophen   Social History   Socioeconomic History   Marital status: Single    Spouse name: Not on file   Number of children: Not on file   Years of education: Not on file   Highest  education level: Not on file  Occupational History   Occupation: Lawyer: [employer]  Tobacco Use   Smoking status: Never   Smokeless tobacco: Never  Vaping Use   Vaping Use: Never used  Substance and Sexual Activity   Alcohol use: Yes    Comment: 11/07/2012 "once/month might have a glass of wine"   Drug use: No   Sexual activity: Yes    Birth control/protection: Surgical  Other Topics Concern   Not on file  Social History Narrative   Not on file   Social Determinants of Health   Financial Resource Strain: Low Risk  (09/05/2022)   Overall Financial Resource Strain (CARDIA)    Difficulty of Paying Living Expenses: Not hard at all  Food Insecurity: No Food Insecurity (09/05/2022)   Hunger Vital Sign    Worried About Running Out of Food in the Last Year: Never true  Ran Out of Food in the Last Year: Never true  Transportation Needs: No Transportation Needs (09/05/2022)   PRAPARE - Hydrologist (Medical): No    Lack of Transportation (Non-Medical): No  Physical Activity: Sufficiently Active (10/03/2021)   Exercise Vital Sign    Days of Exercise per Week: 4 days    Minutes of Exercise per Session: 50 min  Stress: Not on file  Social Connections: Not on file     Family History: The patient's family history includes Cancer in her paternal aunt; Heart attack in her father; Heart disease in an other family member; Hypertension in her father, mother, and another family member; Sudden death in her father. There is no history of Colon cancer, Colon polyps, Rectal cancer, Stomach cancer, or Esophageal cancer.  ROS:   Please see the history of present illness.     All other systems reviewed and are negative.  EKGs/Labs/Other Studies Reviewed:    EKG:  EKG is not ordered today.   Recent Labs: 09/05/2022: ALT 15; BUN 21; Creatinine 1.25; Hemoglobin 13.0; Platelet Count 277; Potassium 4.1; Sodium 137   Recent Lipid Panel     Component Value Date/Time   CHOL 170 03/29/2016 0847   TRIG 41 03/29/2016 0847   HDL 83 03/29/2016 0847   CHOLHDL 2.0 03/29/2016 0847   VLDL 8 03/29/2016 0847   LDLCALC 79 03/29/2016 0847    Physical Exam:   VS:  BP 128/78   Pulse 77   Ht '5\' 7"'$  (1.702 m)   Wt 205 lb (93 kg)   BMI 32.11 kg/m  , BMI Body mass index is 32.11 kg/m. GENERAL:  Well appearing, overweight HEENT: Pupils equal round and reactive, fundi not visualized, oral mucosa unremarkable NECK:  No jugular venous distention, waveform within normal limits, carotid upstroke brisk and symmetric, no bruits, no thyromegaly LYMPHATICS:  No cervical adenopathy LUNGS:  Clear to auscultation bilaterally HEART:  RRR.  PMI not displaced or sustained,S1 and S2 within normal limits, no S3, no S4, no clicks, no rubs, no murmurs ABD:  Flat, positive bowel sounds normal in frequency in pitch, no bruits, no rebound, no guarding, no midline pulsatile mass, no hepatomegaly, no splenomegaly EXT:  2 plus pulses throughout, no edema, no cyanosis no clubbing SKIN:  No rashes no nodules NEURO:  Cranial nerves II through XII grossly intact, motor grossly intact throughout PSYCH:  Cognitively intact, oriented to person place and time   ASSESSMENT/PLAN:    HTN - BP well controlled. Continue current antihypertensive regimen Carvedilol 6.'25mg'$  BID, Chlorthalidone '25mg'$  QD, Spironolactone '25mg'$  QD, Valsartan '320mg'$  QD.  Discussed to monitor BP at home at least 2 hours after medications and sitting for 5-10 minutes. Refills provided.  DM2 / BMI 32 - Continue to follow with PCP regarding DM2. Weight loss via diet and exercise encouraged. Discussed the impact being overweight would have on cardiovascular risk. Plans to increase exercise by returning to biking when her knee issues resolve - has upcoming appt with orthopedics.  Aortic atherosclerosis / HLD - Stable with no anginal symptoms. No indication for ischemic evaluation.  GDMT aspirin, Carvedilol,  Simvastatin. Heart healthy diet and regular cardiovascular exercise encouraged.    Screening for Secondary Hypertension:     10/03/2021    4:19 PM 02/07/2022    9:47 AM  Causes  Drugs/Herbals Screened Screened     - Comments limits sodium. no caffeine.  Rare EtOH.  No more NSAIDS   Renovascular HTN Screened Screened     -  Comments Check renal artery Dopplers No RAS on ultrasound or CT  Sleep Apnea Screened   Thyroid Disease Screened Screened  Hyperaldosteronism Screened Screened     - Comments Check renin and aldosterone high ratio but no adenomas/hyperplasia on CT  Pheochromocytoma N/A Screened  Cushing's Syndrome N/A N/A  Hyperparathyroidism Screened Screened  Coarctation of the Aorta Screened Screened     - Comments BP symmetric BP symmetric  Compliance Screened Screened    Relevant Labs/Studies:    Latest Ref Rng & Units 09/05/2022    8:16 AM 10/27/2021    2:35 PM 09/13/2021   11:39 AM  Basic Labs  Sodium 135 - 145 mmol/L 137  143  140   Potassium 3.5 - 5.1 mmol/L 4.1  4.0  4.3   Creatinine 0.44 - 1.00 mg/dL 1.25  1.03  1.04        Latest Ref Rng & Units 08/08/2021    4:14 PM  Thyroid   TSH 0.450 - 4.500 uIU/mL 1.820        Latest Ref Rng & Units 10/09/2021    8:04 AM  Renin/Aldosterone   Aldosterone 0.0 - 30.0 ng/dL 18.6   Renin 0.167 - 5.380 ng/mL/hr 0.272   Aldos/Renin Ratio 0.0 - 30.0 68.4              10/24/2021   10:08 AM  Renovascular   Renal Artery [place] Completed Yes     Disposition:    FU with MD/PharmD in 6 months    Medication Adjustments/Labs and Tests Ordered: Current medicines are reviewed at length with the patient today.  Concerns regarding medicines are outlined above.  No orders of the defined types were placed in this encounter.  No orders of the defined types were placed in this encounter.    Signed, Loel Dubonnet, NP  12/24/2022 9:16 AM    [place]

## 2022-12-31 ENCOUNTER — Ambulatory Visit: Payer: Self-pay | Admitting: Hematology and Oncology

## 2023-01-01 ENCOUNTER — Ambulatory Visit: Payer: Commercial Managed Care - PPO | Admitting: Orthopaedic Surgery

## 2023-01-01 ENCOUNTER — Encounter: Payer: Self-pay | Admitting: Orthopaedic Surgery

## 2023-01-01 ENCOUNTER — Ambulatory Visit (INDEPENDENT_AMBULATORY_CARE_PROVIDER_SITE_OTHER): Payer: Commercial Managed Care - PPO

## 2023-01-01 ENCOUNTER — Other Ambulatory Visit (HOSPITAL_COMMUNITY): Payer: Self-pay

## 2023-01-01 DIAGNOSIS — M94262 Chondromalacia, left knee: Secondary | ICD-10-CM

## 2023-01-01 DIAGNOSIS — M94261 Chondromalacia, right knee: Secondary | ICD-10-CM

## 2023-01-01 MED ORDER — PREDNISONE 10 MG (21) PO TBPK
ORAL_TABLET | ORAL | 3 refills | Status: DC
  Filled 2023-01-01: qty 21, 6d supply, fill #0
  Filled 2023-06-14: qty 21, 6d supply, fill #1
  Filled 2023-09-21: qty 21, 6d supply, fill #2
  Filled 2023-12-18: qty 21, 6d supply, fill #3

## 2023-01-01 MED ORDER — CELECOXIB 200 MG PO CAPS
200.0000 mg | ORAL_CAPSULE | Freq: Two times a day (BID) | ORAL | 3 refills | Status: DC
  Filled 2023-01-01: qty 30, 15d supply, fill #0

## 2023-01-01 NOTE — Progress Notes (Signed)
Office Visit Note   Patient: Vickie Lane           Date of Birth: 1961-08-14           MRN: [id number] Visit Date: 01/01/2023              Requested by: Kelton Pillar, MD [address] [place],  [place] [address] PCP: Kelton Pillar, MD   Assessment & Plan: Visit Diagnoses:  1. Chondromalacia of knee, left     Plan: I reviewed the left knee MRI which showed that the menisci are okay.  She does have a fair amount of bony edema in the patella near the apex and the medial patellofemoral facets.  I think the problem is coming from this region.  Treatment options were discussed and we will try a steroid taper.  I sent in prescription for Celebrex but she will check with Dr. Laurann Montana prior to taking this.  Physical therapy was also offered and might be a good option once she feels improvement in the knee.  Follow-Up Instructions: No follow-ups on file.   Orders:  Orders Placed This Encounter  Procedures   XR KNEE 3 VIEW LEFT   XR KNEE 3 VIEW RIGHT   Meds ordered this encounter  Medications   predniSONE (STERAPRED UNI-PAK 21 TAB) 10 MG (21) TBPK tablet    Sig: Take as directed    Dispense:  21 tablet    Refill:  3   celecoxib (CELEBREX) 200 MG capsule    Sig: Take 1 capsule (200 mg total) by mouth 2 (two) times daily.    Dispense:  30 capsule    Refill:  3      Procedures: No procedures performed   Clinical Data: No additional findings.   Subjective: Chief Complaint  Patient presents with   Right Knee - Pain   Left Knee - Pain    HPI  Vickie Lane is a 62 year old female here for evaluation of left greater than right knee pain that started in September without any injuries or changes in activity.  States that she has more pain on the medial side of the left knee and lateral side of the right knee.  Originally went to [facility] and underwent a cortisone injection which provided a week of relief in the left knee.  She did not get any  injection in the right knee.  She subsequently got an MRI of the left knee which showed showed no meniscal pathology but did show advanced chondromalacia of the medial patellofemoral region.  She takes naproxen which helps take the edge off.  Review of Systems  Constitutional: Negative.   HENT: Negative.    Eyes: Negative.   Respiratory: Negative.    Cardiovascular: Negative.   Endocrine: Negative.   Musculoskeletal: Negative.   Neurological: Negative.   Hematological: Negative.   Psychiatric/Behavioral: Negative.    All other systems reviewed and are negative.    Objective: Vital Signs: There were no vitals taken for this visit.  Physical Exam Vitals and nursing note reviewed.  Constitutional:      Appearance: She is well-developed.  HENT:     Head: Atraumatic.     Nose: Nose normal.  Eyes:     Extraocular Movements: Extraocular movements intact.  Cardiovascular:     Pulses: Normal pulses.  Pulmonary:     Effort: Pulmonary effort is normal.  Abdominal:     Palpations: Abdomen is soft.  Musculoskeletal:     Cervical back:  Neck supple.  Skin:    General: Skin is warm.     Capillary Refill: Capillary refill takes less than 2 seconds.  Neurological:     Mental Status: She is alert. Mental status is at baseline.  Psychiatric:        Behavior: Behavior normal.        Thought Content: Thought content normal.        Judgment: Judgment normal.     Ortho Exam  Examination bilateral knees show trace effusion.  Slight medial joint line tenderness and tenderness of the medial femoral condyle of the left knee.  Range of motion is preserved.  She has slight lateral joint line tenderness of the right knee.  Collaterals and cruciates are stable.  Specialty Comments:  No specialty comments available.  Imaging: XR KNEE 3 VIEW LEFT  Result Date: 01/01/2023 Mild femoral-tibial osteoarthritis.  More advanced patellofemoral arthritis.  XR KNEE 3 VIEW RIGHT  Result Date:  01/01/2023 Mild osteoarthritis of the femoral-tibial joints.  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   Coronary artery disease 03/13/2022   Chronic kidney disease, stage 2 (mild) 03/13/2022   Allergic rhinitis due to pollen 03/13/2022   Chronic kidney disease due to hypertension 03/13/2022   Sickle cell trait ([place]) 01/31/2022   Hypertension associated with type 2 diabetes mellitus ([place]) 11/06/2021   Diabetes mellitus ([place]) 11/06/2021   Class 1 obesity due to excess calories with body mass index (BMI) of 31.0 to 31.9 in adult 09/11/2021   Carcinoma in situ 09/11/2021   Hyperlipidemia associated with type 2 diabetes mellitus ([place]) 09/11/2021   Hypertension associated with diabetes ([place]) 09/11/2021   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 w/one of my deliveries" (11/07/2012)   Hyperlipidemia 2019   LDL   Hypertension 1993   Joint pain 2011   osteo arthritis   Kidney problem 2008   Lactose intolerance    Mitral valve prolapse    Seizures ([place]) 1991   "related to pregnancy; I had HELLP" (11/07/2012)- lastseizure was 1991 per pt    Sickle cell trait ([place])    Systemic lupus erythematosus ([place]) 1992    joint pain   Type II diabetes mellitus ([place]) 08/2011   Diet and exercise.    Family History  Problem Relation Age of Onset   Hypertension Mother    Hypertension Father    Heart attack Father    Sudden death Father    Cancer Paternal Aunt        GYN (uterine/cervical); d. mid 73s   Heart disease Other    Hypertension Other    Colon cancer Neg Hx    Colon polyps Neg Hx  Rectal cancer Neg Hx    Stomach cancer Neg Hx    Esophageal cancer Neg Hx     Past Surgical History:  Procedure Laterality Date   ABDOMINAL HYSTERECTOMY  1997   BREAST LUMPECTOMY WITH RADIOACTIVE SEED LOCALIZATION Left 09/18/2022   Procedure: LEFT BREAST LUMPECTOMY WITH RADIOACTIVE SEED LOCALIZATION;  Surgeon: Donnie Mesa, MD;  Location: [facility];  Service: General;  Laterality: Left;   CARPAL TUNNEL RELEASE  2008   "right w/fracture OR" (11/07/2012)   [place]  11/07/2012   "L4-5" (11/07/2012)   TRIGGER FINGER RELEASE Right 05/04/2016   Procedure: MINOR RELEASE TRIGGER FINGER/A-1 PULLEY;  Surgeon: Charlotte Crumb, MD;  Location: [facility];  Service: Orthopedics;  Laterality: Right;   WRIST FRACTURE SURGERY  2008   "right" (11/07/2012)   Social History   Occupational History   Occupation: Lawyer: [employer]  Tobacco Use   Smoking status: Never   Smokeless tobacco: Never  Vaping Use   Vaping Use: Never used  Substance and Sexual Activity   Alcohol use: Yes    Comment: 11/07/2012 "once/month might have a glass of wine"   Drug use: No   Sexual activity: Yes    Birth control/protection: Surgical

## 2023-01-02 ENCOUNTER — Encounter (HOSPITAL_BASED_OUTPATIENT_CLINIC_OR_DEPARTMENT_OTHER): Payer: Self-pay

## 2023-01-02 ENCOUNTER — Encounter: Payer: Self-pay | Admitting: Orthopaedic Surgery

## 2023-01-02 ENCOUNTER — Other Ambulatory Visit (HOSPITAL_COMMUNITY): Payer: Self-pay

## 2023-01-03 ENCOUNTER — Other Ambulatory Visit: Payer: Self-pay

## 2023-01-11 ENCOUNTER — Other Ambulatory Visit (HOSPITAL_COMMUNITY): Payer: Self-pay

## 2023-01-17 DIAGNOSIS — I700 Atherosclerosis of aorta: Secondary | ICD-10-CM | POA: Diagnosis not present

## 2023-01-17 DIAGNOSIS — E669 Obesity, unspecified: Secondary | ICD-10-CM | POA: Diagnosis not present

## 2023-01-17 DIAGNOSIS — D0512 Intraductal carcinoma in situ of left breast: Secondary | ICD-10-CM | POA: Diagnosis not present

## 2023-01-17 DIAGNOSIS — E1121 Type 2 diabetes mellitus with diabetic nephropathy: Secondary | ICD-10-CM | POA: Diagnosis not present

## 2023-01-17 DIAGNOSIS — Z6831 Body mass index (BMI) 31.0-31.9, adult: Secondary | ICD-10-CM | POA: Diagnosis not present

## 2023-01-17 DIAGNOSIS — E7800 Pure hypercholesterolemia, unspecified: Secondary | ICD-10-CM | POA: Diagnosis not present

## 2023-01-17 DIAGNOSIS — I129 Hypertensive chronic kidney disease with stage 1 through stage 4 chronic kidney disease, or unspecified chronic kidney disease: Secondary | ICD-10-CM | POA: Diagnosis not present

## 2023-01-17 DIAGNOSIS — M329 Systemic lupus erythematosus, unspecified: Secondary | ICD-10-CM | POA: Diagnosis not present

## 2023-02-05 ENCOUNTER — Encounter: Payer: Self-pay | Admitting: Orthopaedic Surgery

## 2023-02-05 ENCOUNTER — Telehealth: Payer: Self-pay

## 2023-02-05 ENCOUNTER — Other Ambulatory Visit: Payer: Self-pay

## 2023-02-05 NOTE — Telephone Encounter (Signed)
Ok to do

## 2023-02-05 NOTE — Telephone Encounter (Signed)
Approval for L knee gel inj

## 2023-02-06 ENCOUNTER — Telehealth: Payer: Self-pay

## 2023-02-06 NOTE — Telephone Encounter (Signed)
VOB submitted for Monovisc, left knee.  

## 2023-02-06 NOTE — Telephone Encounter (Signed)
Previously submitted for gel injection

## 2023-02-13 ENCOUNTER — Other Ambulatory Visit: Payer: Self-pay | Admitting: *Deleted

## 2023-02-13 DIAGNOSIS — D0512 Intraductal carcinoma in situ of left breast: Secondary | ICD-10-CM

## 2023-02-14 ENCOUNTER — Encounter: Payer: Self-pay | Admitting: Adult Health

## 2023-02-14 ENCOUNTER — Inpatient Hospital Stay: Payer: Commercial Managed Care - PPO

## 2023-02-14 ENCOUNTER — Inpatient Hospital Stay: Payer: Commercial Managed Care - PPO | Attending: Hematology and Oncology | Admitting: Adult Health

## 2023-02-14 VITALS — BP 128/60 | HR 83 | Temp 97.5°F | Resp 18 | Ht 67.0 in | Wt 205.6 lb

## 2023-02-14 DIAGNOSIS — D0512 Intraductal carcinoma in situ of left breast: Secondary | ICD-10-CM | POA: Insufficient documentation

## 2023-02-14 DIAGNOSIS — E119 Type 2 diabetes mellitus without complications: Secondary | ICD-10-CM | POA: Diagnosis not present

## 2023-02-14 DIAGNOSIS — Z79810 Long term (current) use of selective estrogen receptor modulators (SERMs): Secondary | ICD-10-CM | POA: Diagnosis not present

## 2023-02-14 DIAGNOSIS — Z90710 Acquired absence of both cervix and uterus: Secondary | ICD-10-CM | POA: Diagnosis not present

## 2023-02-14 DIAGNOSIS — I10 Essential (primary) hypertension: Secondary | ICD-10-CM | POA: Insufficient documentation

## 2023-02-14 LAB — CBC WITH DIFFERENTIAL (CANCER CENTER ONLY)
Abs Immature Granulocytes: 0.01 10*3/uL (ref 0.00–0.07)
Basophils Absolute: 0 10*3/uL (ref 0.0–0.1)
Basophils Relative: 0 %
Eosinophils Absolute: 0 10*3/uL (ref 0.0–0.5)
Eosinophils Relative: 1 %
HCT: 35.9 % — ABNORMAL LOW (ref 36.0–46.0)
Hemoglobin: 12 g/dL (ref 12.0–15.0)
Immature Granulocytes: 0 %
Lymphocytes Relative: 55 %
Lymphs Abs: 2.8 10*3/uL (ref 0.7–4.0)
MCH: 28.3 pg (ref 26.0–34.0)
MCHC: 33.4 g/dL (ref 30.0–36.0)
MCV: 84.7 fL (ref 80.0–100.0)
Monocytes Absolute: 0.3 10*3/uL (ref 0.1–1.0)
Monocytes Relative: 6 %
Neutro Abs: 1.9 10*3/uL (ref 1.7–7.7)
Neutrophils Relative %: 38 %
Platelet Count: 270 10*3/uL (ref 150–400)
RBC: 4.24 MIL/uL (ref 3.87–5.11)
RDW: 13.2 % (ref 11.5–15.5)
WBC Count: 5.1 10*3/uL (ref 4.0–10.5)
nRBC: 0 % (ref 0.0–0.2)

## 2023-02-14 LAB — CMP (CANCER CENTER ONLY)
ALT: 13 U/L (ref 0–44)
AST: 16 U/L (ref 15–41)
Albumin: 3.9 g/dL (ref 3.5–5.0)
Alkaline Phosphatase: 48 U/L (ref 38–126)
Anion gap: 5 (ref 5–15)
BUN: 14 mg/dL (ref 6–20)
CO2: 30 mmol/L (ref 22–32)
Calcium: 10.1 mg/dL (ref 8.9–10.3)
Chloride: 107 mmol/L (ref 98–111)
Creatinine: 1.35 mg/dL — ABNORMAL HIGH (ref 0.44–1.00)
GFR, Estimated: 45 mL/min — ABNORMAL LOW (ref >60)
Glucose, Bld: 132 mg/dL — ABNORMAL HIGH (ref 70–99)
Potassium: 3.6 mmol/L (ref 3.5–5.1)
Sodium: 142 mmol/L (ref 135–145)
Total Bilirubin: 0.4 mg/dL (ref 0.3–1.2)
Total Protein: 7.1 g/dL (ref 6.5–8.1)

## 2023-02-14 NOTE — Progress Notes (Signed)
SURVIVORSHIP VISIT:  BRIEF ONCOLOGIC HISTORY:  Oncology History  Ductal carcinoma in situ (DCIS) of left breast  07/27/2022 Mammogram   Bilateral screening mammogram showed left breast calcifications warranting further evaluation.  No findings suspicious for malignancy in the right breast.  Diagnostic mammogram of the left breast confirmed 1.2 x 0.8 x 1.1 cm hypoechoic circumscribed mass in the left breast at 2:00, no hilar fat or convincing hilar blood flow.  Ultrasound showed several normal lymph nodes.  No enlarged or abnormal left axillary lymph nodes.   08/21/2022 Pathology Results   Pathology from the left breast needle core biopsy showed intermediate nuclear grade DCIS, necrosis present, calcs present, ER 95% positive strong staining PR 40% positive strong staining   08/31/2022 Initial Diagnosis   Ductal carcinoma in situ (DCIS) of left breast   09/10/2022 Genetic Testing   Negative [facility] CustomNext-Cancer +RNAinsight Panel.  Report date is 09/23/2022.    The CustomNext-Cancer+RNAinsight panel offered by [facility] includes sequencing and rearrangement analysis for the following 50 genes:  APC, ATM, BAP1, BARD1, BMPR1A, BRCA1, BRCA2, BRIP1, CDH1, CDK4, CDKN2A, CHEK2, DICER1, MEN1, MLH1, MSH2, MSH6, MUTYH, NBN, NF1, NF2, NTHL1, PALB2, PMS2, POT1, PTEN, RAD51C, RAD51D, RECQL, RET, SDHA, SDHAF2, SDHB, SDHC, SDHD, SMAD4, SMARCA4, STK11, TP53, TSC1, TSC2 and VHL (sequencing and deletion/duplication); AXIN2, HOXB13, MITF, MSH3, POLD1 and POLE (sequencing only); EPCAM and GREM1 (deletion/duplication only). RNA data is routinely analyzed for use in variant interpretation for all genes.       11/2022 -  Anti-estrogen oral therapy   Tamoxifen     INTERVAL HISTORY:  Ms. Lincoln BrighamRivers-Sams to review her survivorship care plan detailing her treatment course for breast cancer, as well as monitoring long-term side effects of that treatment, education regarding health maintenance, screening, and overall  wellness and health promotion.     Overall, Ms. Lincoln BrighamRivers-Sams reports feeling quite well.  She is taking Tamoxifen daily.  She is experiencing hot flashes and these are challenging for her.  She said that she didn't note that the Effexor was helpful in alleviating these.   REVIEW OF SYSTEMS:  Review of Systems  Constitutional:  Negative for appetite change, chills, fatigue, fever and unexpected weight change.  HENT:   Negative for hearing loss, lump/mass and trouble swallowing.   Eyes:  Negative for eye problems and icterus.  Respiratory:  Negative for chest tightness, cough and shortness of breath.   Cardiovascular:  Negative for chest pain, leg swelling and palpitations.  Gastrointestinal:  Negative for abdominal distention, abdominal pain, constipation, diarrhea, nausea and vomiting.  Endocrine: Negative for hot flashes.  Genitourinary:  Negative for difficulty urinating.   Musculoskeletal:  Negative for arthralgias.  Skin:  Negative for itching and rash.  Neurological:  Negative for dizziness, extremity weakness, headaches and numbness.  Hematological:  Negative for adenopathy. Does not bruise/bleed easily.  Psychiatric/Behavioral:  Negative for depression. The patient is not nervous/anxious.    Breast: Denies any new nodularity, masses, tenderness, nipple changes, or nipple discharge.      PAST MEDICAL/SURGICAL HISTORY:  Past Medical History:  Diagnosis Date   Allergy 1990   Arthritis 2011   "back" (11/07/2012)   Breast cancer (HCC)    Chest pain    Chronic lower back pain    Constipation    GERD (gastroesophageal reflux disease) 1991   Heart murmur    "MVP" (`11/07/2012)   History of blood transfusion 1990's   "? w/one of my deliveries" (11/07/2012)   Hyperlipidemia 2019   LDL  Hypertension 1993   Joint pain 2011   osteo arthritis   Kidney problem 2008   Lactose intolerance    Mitral valve prolapse    Seizures (HCC) 1991   "related to pregnancy; I had HELLP"  (11/07/2012)- lastseizure was 1991 per pt    Sickle cell trait (HCC)    Systemic lupus erythematosus (HCC) 1992   joint pain   Type II diabetes mellitus (HCC) 08/2011   Diet and exercise.   Past Surgical History:  Procedure Laterality Date   ABDOMINAL HYSTERECTOMY  1997   BREAST LUMPECTOMY WITH RADIOACTIVE SEED LOCALIZATION Left 09/18/2022   Procedure: LEFT BREAST LUMPECTOMY WITH RADIOACTIVE SEED LOCALIZATION;  Surgeon: Manus Rudd, MD;  Location: [facility];  Service: General;  Laterality: Left;   CARPAL TUNNEL RELEASE  2008   "right w/fracture OR" (11/07/2012)   CESAREAN SECTION  1991, 1993   COLONOSCOPY     POLYPECTOMY     POSTERIOR LUMBAR FUSION  11/07/2012   "L4-5" (11/07/2012)   TRIGGER FINGER RELEASE Right 05/04/2016   Procedure: MINOR RELEASE TRIGGER FINGER/A-1 PULLEY;  Surgeon: Dairl Ponder, MD;  Location: [facility];  Service: Orthopedics;  Laterality: Right;   WRIST FRACTURE SURGERY  2008   "right" (11/07/2012)     ALLERGIES:  Allergies  Allergen Reactions   Amlodipine Swelling   Amlodipine Besylate Swelling    Other reaction(s): anaphylaxis   Ciprofloxacin Anaphylaxis   Codeine Nausea Only, Other (See Comments), Anaphylaxis and Nausea And Vomiting    "dry heaves" (11/07/2012) Other reaction(s): stomach upset   Hydrocodone Nausea Only and Other (See Comments)    "dry heaves" (11/07/2012)   Levofloxacin Anaphylaxis and Rash    Other reaction(s): anaphylaxis   Ultram [Tramadol Hcl] Nausea Only and Other (See Comments)    "dry heaves" (11/07/2012)   Hydrocodone-Acetaminophen Nausea And Vomiting    Other reaction(s): stomach upset     CURRENT MEDICATIONS:  Outpatient Encounter Medications as of 02/14/2023  Medication Sig   aspirin EC 81 MG tablet Take 81 mg by mouth every morning.   b complex vitamins capsule Take 1 capsule by mouth daily.   carvedilol (COREG) 6.25 MG tablet Take 1 tablet (6.25 mg total) by mouth 2 (two)  times daily.   Cetirizine-Pseudoephedrine (ZYRTEC-D PO) Take 1 tablet by mouth daily as needed. For allergies   chlorthalidone (HYGROTON) 25 MG tablet Take 1 tablet (25 mg total) by mouth daily.   Cholecalciferol (VITAMIN D3 PO) Take 125 mcg by mouth daily.   esomeprazole (NEXIUM) 20 MG packet Take 20 mg by mouth as needed.   FREESTYLE LITE test strip    Lancets (FREESTYLE) lancets    meloxicam (MOBIC) 7.5 MG tablet Take 7.5 mg by mouth daily as needed for pain.   Multiple Vitamin (MULTIVITAMIN WITH MINERALS) TABS Take 1 tablet by mouth daily.   predniSONE (STERAPRED UNI-PAK 21 TAB) 10 MG (21) TBPK tablet Take as directed   simvastatin (ZOCOR) 40 MG tablet Take 1 tablet (40 mg total) by mouth at bedtime.   spironolactone (ALDACTONE) 25 MG tablet Take 1 tablet (25 mg total) by mouth daily.   tamoxifen (NOLVADEX) 20 MG tablet Take 1 tablet (20 mg total) by mouth daily.   valACYclovir (VALTREX) 500 MG tablet Take 1 tablet (500 mg total) by mouth daily.   valsartan (DIOVAN) 320 MG tablet Take 1 tablet (320 mg total) by mouth daily.   vitamin B-12 (CYANOCOBALAMIN) 1000 MCG tablet Take 5,000 mcg by mouth daily.   celecoxib (  CELEBREX) 200 MG capsule Take 1 capsule (200 mg total) by mouth 2 (two) times daily. (Patient not taking: Reported on 02/14/2023)   venlafaxine XR (EFFEXOR-XR) 37.5 MG 24 hr capsule Take 1 capsule (37.5 mg total) by mouth daily with breakfast.   [DISCONTINUED] MAGNESIUM GLYCINATE PO Take by mouth.   No facility-administered encounter medications on file as of 02/14/2023.     ONCOLOGIC FAMILY HISTORY:  Family History  Problem Relation Age of Onset   Hypertension Mother    Hypertension Father    Heart attack Father    Sudden death Father    Cancer Paternal Aunt        GYN (uterine/cervical); d. mid 2s   Heart disease Other    Hypertension Other    Colon cancer Neg Hx    Colon polyps Neg Hx    Rectal cancer Neg Hx    Stomach cancer Neg Hx    Esophageal cancer Neg Hx       SOCIAL HISTORY:  Social History   Socioeconomic History   Marital status: Single    Spouse name: Not on file   Number of children: Not on file   Years of education: Not on file   Highest education level: Not on file  Occupational History   Occupation: Acupuncturist: [employer]  Tobacco Use   Smoking status: Never   Smokeless tobacco: Never  Vaping Use   Vaping Use: Never used  Substance and Sexual Activity   Alcohol use: Yes    Comment: 11/07/2012 "once/month might have a glass of wine"   Drug use: No   Sexual activity: Yes    Birth control/protection: Surgical  Other Topics Concern   Not on file  Social History Narrative   Not on file   Social Determinants of Health   Financial Resource Strain: Low Risk  (09/05/2022)   Overall Financial Resource Strain (CARDIA)    Difficulty of Paying Living Expenses: Not hard at all  Food Insecurity: No Food Insecurity (09/05/2022)   Hunger Vital Sign    Worried About Running Out of Food in the Last Year: Never true    Ran Out of Food in the Last Year: Never true  Transportation Needs: No Transportation Needs (09/05/2022)   PRAPARE - Administrator, Civil Service (Medical): No    Lack of Transportation (Non-Medical): No  Physical Activity: Sufficiently Active (10/03/2021)   Exercise Vital Sign    Days of Exercise per Week: 4 days    Minutes of Exercise per Session: 50 min  Stress: Not on file  Social Connections: Not on file  Intimate Partner Violence: Not on file     OBSERVATIONS/OBJECTIVE:  BP 128/60 (BP Location: Right Arm, Patient Position: Sitting)   Pulse 83   Temp (!) 97.5 F (36.4 C) (Tympanic)   Resp 18   Ht 5\' 7"  (1.702 m)   Wt 205 lb 9.6 oz (93.3 kg)   SpO2 100%   BMI 32.20 kg/m  GENERAL: Patient is a well appearing female in no acute distress HEENT:  Sclerae anicteric.  Oropharynx clear and moist. No ulcerations or evidence of oropharyngeal candidiasis. Neck is supple.   NODES:  No cervical, supraclavicular, or axillary lymphadenopathy palpated.  BREAST EXAM:  left breast s/p lumpectomy, no sign of recurrence, right breast benign LUNGS:  Clear to auscultation bilaterally.  No wheezes or rhonchi. HEART:  Regular rate and rhythm. No murmur appreciated. ABDOMEN:  Soft, nontender.  Positive, normoactive bowel  sounds. No organomegaly palpated. MSK:  No focal spinal tenderness to palpation. Full range of motion bilaterally in the upper extremities. EXTREMITIES:  No peripheral edema.   SKIN:  Clear with no obvious rashes or skin changes. No nail dyscrasia. NEURO:  Nonfocal. Well oriented.  Appropriate affect.  LABORATORY DATA:  None for this visit.  DIAGNOSTIC IMAGING:  None for this visit.      ASSESSMENT AND PLAN:  Ms.. Lincoln Brigham is a pleasant 62 y.o. female with Stage 0 left breast DCIS, ER+/PR+, diagnosed in 08/2022, treated with lumpectomy and anti-estrogen therapy with Tamoxifen beginning in 11/2022.  She presents to the Survivorship Clinic for our initial meeting and routine follow-up post-completion of treatment for breast cancer.    1. Stage 0 left breast cancer:  Ms. Lincoln Brigham is continuing to recover from definitive treatment for breast cancer. She will follow-up with her medical oncologist, Dr. Al Pimple in 6 months with history and physical exam per surveillance protocol.  She will continue her anti-estrogen therapy with Tamoxifen, see #2. Marland KitchenHer mammogram is due 07/2023; orders placed today. Today, a comprehensive survivorship care plan and treatment summary was reviewed with the patient today detailing her breast cancer diagnosis, treatment course, potential late/long-term effects of treatment, appropriate follow-up care with recommendations for the future, and patient education resources.  A copy of this summary, along with a letter will be sent to the patient's primary care provider via mail/fax/In Basket message after today's visit.    2. Hot  flashes: These could improve with time.  I recommended that she consider Veozah and gave her information about this to review today.  3. Bone health:  She was given education on specific activities to promote bone health.  4. Cancer screening:  Due to [name]'s history and her age, she should receive screening for skin cancers, colon cancer, and gynecologic cancers.  The information and recommendations are listed on the patient's comprehensive care plan/treatment summary and were reviewed in detail with the patient.    5. Health maintenance and wellness promotion: Ms. Lincoln Brigham was encouraged to consume 5-7 servings of fruits and vegetables per day. We reviewed the "Nutrition Rainbow" handout.  She was also encouraged to engage in moderate to vigorous exercise for 30 minutes per day most days of the week. She was instructed to limit her alcohol consumption and continue to abstain from tobacco use.     6. Support services/counseling: It is not uncommon for this period of the patient's cancer care trajectory to be one of many emotions and stressors.  She was given information regarding our available services and encouraged to contact me with any questions or for help enrolling in any of our support group/programs.    Follow up instructions:    -Return to cancer center in 6 months for f/u with Dr. Al Pimple  -Mammogram due in 07/2023 -She is welcome to return back to the Survivorship Clinic at any time; no additional follow-up needed at this time.  -Consider referral back to survivorship as a long-term survivor for continued surveillance  The patient was provided an opportunity to ask questions and all were answered. The patient agreed with the plan and demonstrated an understanding of the instructions.   Total encounter time:30 minutes*in face-to-face visit time, chart review, lab review, care coordination, order entry, and documentation of the encounter time.    Lillard Anes, NP  02/14/23 3:39 PM Medical Oncology and Hematology [facility] [address] [place], [place] [address] Tel. [phone]  Fax. [phone]  *Total Encounter Time as defined by the Centers for Medicare and Medicaid Services includes, in addition to the face-to-face time of a patient visit (documented in the note above) non-face-to-face time: obtaining and reviewing outside history, ordering and reviewing medications, tests or procedures, care coordination (communications with other health care professionals or caregivers) and documentation in the medical record.

## 2023-02-14 NOTE — Patient Instructions (Signed)
Fezolinetant Tablets What is this medication? FEZOLINETANT (FEZ oh LIN e tant) reduces the number and severity of hot flashes due to menopause. It works by blocking substances in your body that cause hot flashes and night sweats. This medicine may be used for other purposes; ask your health care provider or pharmacist if you have questions. COMMON BRAND NAME(S): VEOZAH What should I tell my care team before I take this medication? They need to know if you have any of these conditions: Kidney disease Liver disease An unusual or allergic reaction to fezolinetant, other medications, foods, dyes, or preservatives Pregnant or trying to get pregnant Breastfeeding How should I use this medication? Take this medication by mouth with water. Take it as directed on the prescription label at the same time every day. Do not cut, crush, or chew this medication. Swallow the tablets whole. You can take it with or without food. If it upsets your stomach, take it with food. Keep taking it unless your care team tells you to stop. Talk to your care team about the use of this medication in children. Special care may be needed. Overdosage: If you think you have taken too much of this medicine contact a poison control center or emergency room at once. NOTE: This medicine is only for you. Do not share this medicine with others. What if I miss a dose? If you miss a dose, take it as soon as you can unless it is more than 12 hours late. If it is more than 12 hours late, skip the missed dose. Take the next dose at the normal time. What may interact with this medication? Other medications may affect the way this medication works. Talk with your care team about all of the medications you take. They may suggest changes to your treatment plan to lower the risk of side effects and to make sure your medications work as intended. This list may not describe all possible interactions. Give your health care provider a list of all  the medicines, herbs, non-prescription drugs, or dietary supplements you use. Also tell them if you smoke, drink alcohol, or use illegal drugs. Some items may interact with your medicine. What should I watch for while using this medication? Visit your care team for regular checks on your progress. Tell your care team if your symptoms do not start to get better or if they get worse. You may need blood work while taking this medication. What side effects may I notice from receiving this medication? Side effects that you should report to your care team as soon as possible: Allergic reactions--skin rash, itching, hives, swelling of the face, lips, tongue, or throat Liver injury--right upper belly pain, loss of appetite, nausea, light-colored stool, dark yellow or brown urine, yellowing skin or eyes, unusual weakness or fatigue Side effects that usually do not require medical attention (report these to your care team if they continue or are bothersome): Back pain Diarrhea Hot flashes Stomach pain Trouble sleeping This list may not describe all possible side effects. Call your doctor for medical advice about side effects. You may report side effects to FDA at 1-800-FDA-1088. Where should I keep my medication? Keep out of the reach of children and pets. Store at room temperature between 20 and 25 degrees C (68 and 77 degrees F). Get rid of any unused medication after the expiration date. To get rid of medications that are no longer needed or have expired: Take the medication to a medication take-back program. Check with   your pharmacy or law enforcement to find a location. If you cannot return the medication, check the label or package insert to see if the medication should be thrown out in the garbage or flushed down the toilet. If you are not sure, ask your care team. If it is safe to put it in the trash, take the medication out of the container. Mix the medication with cat litter, dirt, coffee  grounds, or other unwanted substance. Seal the mixture in a bag or container. Put it in the trash. NOTE: This sheet is a summary. It may not cover all possible information. If you have questions about this medicine, talk to your doctor, pharmacist, or health care provider.  2023 Elsevier/Gold Standard (2022-04-12 00:00:00)  

## 2023-02-15 ENCOUNTER — Other Ambulatory Visit: Payer: Self-pay

## 2023-02-15 ENCOUNTER — Encounter (HOSPITAL_COMMUNITY): Payer: Self-pay | Admitting: Emergency Medicine

## 2023-02-15 ENCOUNTER — Emergency Department (HOSPITAL_COMMUNITY)
Admission: EM | Admit: 2023-02-15 | Discharge: 2023-02-16 | Disposition: A | Discharge status: Home / Self Care | Payer: Commercial Managed Care - PPO | Attending: Emergency Medicine | Admitting: Emergency Medicine

## 2023-02-15 ENCOUNTER — Emergency Department (HOSPITAL_COMMUNITY): Payer: Commercial Managed Care - PPO

## 2023-02-15 ENCOUNTER — Telehealth: Payer: Self-pay | Admitting: Adult Health

## 2023-02-15 DIAGNOSIS — M7989 Other specified soft tissue disorders: Secondary | ICD-10-CM | POA: Diagnosis not present

## 2023-02-15 DIAGNOSIS — S92901A Unspecified fracture of right foot, initial encounter for closed fracture: Secondary | ICD-10-CM | POA: Diagnosis not present

## 2023-02-15 DIAGNOSIS — Z7982 Long term (current) use of aspirin: Secondary | ICD-10-CM | POA: Insufficient documentation

## 2023-02-15 DIAGNOSIS — W010XXA Fall on same level from slipping, tripping and stumbling without subsequent striking against object, initial encounter: Secondary | ICD-10-CM | POA: Diagnosis not present

## 2023-02-15 DIAGNOSIS — S92341A Displaced fracture of fourth metatarsal bone, right foot, initial encounter for closed fracture: Secondary | ICD-10-CM | POA: Diagnosis not present

## 2023-02-15 DIAGNOSIS — S99921A Unspecified injury of right foot, initial encounter: Secondary | ICD-10-CM | POA: Diagnosis present

## 2023-02-15 DIAGNOSIS — S92351A Displaced fracture of fifth metatarsal bone, right foot, initial encounter for closed fracture: Secondary | ICD-10-CM | POA: Diagnosis not present

## 2023-02-15 MED ORDER — ACETAMINOPHEN 325 MG PO TABS
650.0000 mg | ORAL_TABLET | Freq: Once | ORAL | Status: AC
  Administered 2023-02-15: 650 mg via ORAL
  Filled 2023-02-15: qty 2

## 2023-02-15 NOTE — ED Provider Triage Note (Signed)
Emergency Medicine Provider Triage Evaluation Note  Cortasia Khouri Rivers-Sams , a 62 y.o. female  was evaluated in triage.  Pt complains of right foot injury.  Patient tripped over a box at her cousin's birthday party and injured her right foot.  She complains of severe pain in the midfoot between her first and second toe.  She denies any numbness or tingling.  She has been unable to bear weight..  Review of Systems  Positive: Foot injury  Negative: swelling  Physical Exam  BP (!) 140/86   Pulse 72   Temp 97.7 F (36.5 C) (Oral)   Resp 16   SpO2 96%  Gen:   Awake, no distress   Resp:  Normal effort  MSK:   Moves extremities without difficulty  Other:  Right foot swollen, wiggles toes,  Medical Decision Making  Medically screening exam initiated at 8:09 PM.  Appropriate orders placed.  Trinisha Rojek Rivers-Sams was informed that the remainder of the evaluation will be completed by another provider, this initial triage assessment does not replace that evaluation, and the importance of remaining in the ED until their evaluation is complete.     Margarita Mail, PA-C 02/15/23 2012

## 2023-02-15 NOTE — ED Triage Notes (Signed)
Pt tripped and fell and has R ankle swelling at this time. Denies head trauma.

## 2023-02-15 NOTE — Telephone Encounter (Signed)
Scheduled appointment per 3/28 los. Left voicemail.

## 2023-02-16 ENCOUNTER — Emergency Department (HOSPITAL_COMMUNITY): Payer: Commercial Managed Care - PPO

## 2023-02-16 DIAGNOSIS — M7989 Other specified soft tissue disorders: Secondary | ICD-10-CM | POA: Diagnosis not present

## 2023-02-16 MED ORDER — ACETAMINOPHEN 500 MG PO TABS
1000.0000 mg | ORAL_TABLET | Freq: Once | ORAL | Status: AC
  Administered 2023-02-16: 1000 mg via ORAL
  Filled 2023-02-16: qty 2

## 2023-02-16 NOTE — Progress Notes (Signed)
Orthopedic Tech Progress Note Patient Details:  Vickie Lane Sep 30, 1961 [id number]  Ortho Devices Type of Ortho Device: Crutches, Short leg splint Ortho Device/Splint Location: RLE Ortho Device/Splint Interventions: Ordered, Application, Adjustment   Post Interventions Patient Tolerated: Well Instructions Provided: Adjustment of device, Care of device, Poper ambulation with device  [name] [name] 02/16/2023, 2:52 AM

## 2023-02-16 NOTE — ED Notes (Signed)
Ice pack given and delay explained.

## 2023-02-17 NOTE — ED Provider Notes (Signed)
[facility] Provider Note   CSN: [id number] Arrival date & time: 02/15/23  1944     History  Chief Complaint  Patient presents with   Foot Injury    Vickie Lane is a 62 y.o. female.  Patient tripped over something and hurt her right foot.  Came here for further evaluation. Not able to walk on it. No injuries elsewhere.    Foot Injury      Home Medications Prior to Admission medications   Medication Sig Start Date End Date Taking? Authorizing Provider  aspirin EC 81 MG tablet Take 81 mg by mouth every morning.    [provider]  b complex vitamins capsule Take 1 capsule by mouth daily.    [provider]  carvedilol (COREG) 6.25 MG tablet Take 1 tablet (6.25 mg total) by mouth 2 (two) times daily. 12/24/22 12/19/23  Loel Dubonnet, NP  Cetirizine-Pseudoephedrine (ZYRTEC-D PO) Take 1 tablet by mouth daily as needed. For allergies    [provider]  chlorthalidone (HYGROTON) 25 MG tablet Take 1 tablet (25 mg total) by mouth daily. 12/24/22 12/19/23  Loel Dubonnet, NP  Cholecalciferol (VITAMIN D3 PO) Take 125 mcg by mouth daily.    [provider]  esomeprazole (NEXIUM) 20 MG packet Take 20 mg by mouth as needed.    [provider]  FREESTYLE LITE test strip  06/30/18   [provider]  Lancets (FREESTYLE) lancets  06/30/18   [provider]  meloxicam (MOBIC) 7.5 MG tablet Take 7.5 mg by mouth daily as needed for pain.    [provider]  Multiple Vitamin (MULTIVITAMIN WITH MINERALS) TABS Take 1 tablet by mouth daily.    [provider]  predniSONE (STERAPRED UNI-PAK 21 TAB) 10 MG (21) TBPK tablet Take as directed 01/01/23   Leandrew Koyanagi, MD  simvastatin (ZOCOR) 40 MG tablet Take 1 tablet (40 mg total) by mouth at bedtime. 12/24/22 12/19/23  Loel Dubonnet, NP  spironolactone (ALDACTONE) 25 MG tablet Take 1 tablet (25 mg total) by mouth  daily. 12/24/22   Loel Dubonnet, NP  tamoxifen (NOLVADEX) 20 MG tablet Take 1 tablet (20 mg total) by mouth daily. 11/21/22   Benay Pike, MD  valACYclovir (VALTREX) 500 MG tablet Take 1 tablet (500 mg total) by mouth daily. 09/19/22   Griffin Basil, MD  valsartan (DIOVAN) 320 MG tablet Take 1 tablet (320 mg total) by mouth daily. 12/24/22   Loel Dubonnet, NP  venlafaxine XR (EFFEXOR-XR) 37.5 MG 24 hr capsule Take 1 capsule (37.5 mg total) by mouth daily with breakfast. 11/21/22   Benay Pike, MD  vitamin B-12 (CYANOCOBALAMIN) 1000 MCG tablet Take 5,000 mcg by mouth daily.    [provider]      Allergies    Amlodipine, Amlodipine besylate, Ciprofloxacin, Codeine, Hydrocodone, Levofloxacin, Ultram [tramadol hcl], and Hydrocodone-acetaminophen    Review of Systems   Review of Systems  Physical Exam Updated Vital Signs BP (!) 149/84 (BP Location: Right Arm)   Pulse 68   Temp 97.7 F (36.5 C) (Oral)   Resp 18   SpO2 100%  Physical Exam Vitals and nursing note reviewed.  Constitutional:      Appearance: She is well-developed.  HENT:     Head: Normocephalic and atraumatic.     Mouth/Throat:     Mouth: Mucous membranes are moist.     Pharynx: Oropharynx is clear.  Eyes:  Pupils: Pupils are equal, round, and reactive to light.  Cardiovascular:     Rate and Rhythm: Normal rate and regular rhythm.  Pulmonary:     Effort: No respiratory distress.     Breath sounds: No stridor.  Abdominal:     General: There is no distension.  Musculoskeletal:        General: Tenderness (to mid foot with edema and small hematoma) present.     Cervical back: Normal range of motion.  Skin:    General: Skin is warm and dry.  Neurological:     General: No focal deficit present.     Mental Status: She is alert.     ED Results / Procedures / Treatments   Labs (all labs ordered are listed, but only abnormal results are displayed) Labs Reviewed - No data to  display  EKG None  Radiology CT Foot Right Wo Contrast  Result Date: 02/16/2023 CLINICAL DATA:  Trauma.  Concern for occult fracture. EXAM: CT OF THE RIGHT FOOT WITHOUT CONTRAST TECHNIQUE: Multidetector CT imaging of the right foot was performed according to the standard protocol. Multiplanar CT image reconstructions were also generated. RADIATION DOSE REDUCTION: This exam was performed according to the departmental dose-optimization program which includes automated exposure control, adjustment of the mA and/or kV according to patient size and/or use of iterative reconstruction technique. COMPARISON:  Earlier radiograph dated 02/15/2023. FINDINGS: Bones/Joint/Cartilage There is a small cortical fracture of the anterior dorsal cortex of the medial cuneiform extending into the articular surface with the base of the first metatarsal. There is no significant widening of the Lisfranc joint or malalignment of the first and second metatarsals in relation to the medial and middle cuneiform bones. There is comminuted appearing intra-articular fracture of the lateral cuneiform at the articulation with the base of the third metatarsal. There is fracture of the plantar base of the third metatarsal. The third metatarsal remains in alignment with the lateral cuneiform bone. There is a comminuted intra-articular fracture of the base of the fourth metatarsal which remains in alignment with the cuboid. There is no dislocation. Ligaments Suboptimally assessed by CT. Muscles and Tendons No large intramuscular hematoma. Soft tissues Soft tissue swelling of the foot with small hematoma over the dorsum of the foot. No radiopaque foreign object or soft tissue gas. IMPRESSION: Tarsal and metatarsal fractures as above. No definite widening of the Lisfranc joint or tarsometatarsal malalignment. Electronically Signed   By: Anner Crete M.D.   On: 02/16/2023 02:30   DG Foot Complete Right  Result Date: 02/15/2023 CLINICAL DATA:   Fall with dorsal mid foot pain EXAM: RIGHT FOOT COMPLETE - 3 VIEW COMPARISON:  None Available. FINDINGS: Slight lateral subluxation of the second metatarsal base relative to the middle cuneiform with widening of the Lisfranc joint associated with ill-defined radiodensity projecting in the joint space. Transverse fractures of the third and fourth metatarsal base with mild lateral displacement. IMPRESSION: Lisfranc injury with fractures of the third and fourth metatarsal bases. Electronically Signed   By: Darrin Nipper M.D.   On: 02/15/2023 20:40    Procedures Procedures    Medications Ordered in ED Medications  acetaminophen (TYLENOL) tablet 650 mg (650 mg Oral Given 02/15/23 2011)  acetaminophen (TYLENOL) tablet 1,000 mg (1,000 mg Oral Given 02/16/23 [id number])    ED Course/ Medical Decision Making/ A&P  Medical Decision Making Risk OTC drugs.   Fracture, questionable lisfranc? Splint placed and crutches provided, will fu w/ ortho for eval of same. Has seen local provider for knee in past so will call them.   Final Clinical Impression(s) / ED Diagnoses Final diagnoses:  Closed fracture of right foot, initial encounter    Rx / DC Orders ED Discharge Orders     None         Cai Flott, Corene Cornea, MD 02/17/23 [phone]

## 2023-02-19 ENCOUNTER — Other Ambulatory Visit (HOSPITAL_COMMUNITY): Payer: Self-pay

## 2023-02-20 ENCOUNTER — Telehealth: Payer: Self-pay

## 2023-02-20 ENCOUNTER — Other Ambulatory Visit (HOSPITAL_COMMUNITY): Payer: Self-pay

## 2023-02-20 ENCOUNTER — Ambulatory Visit: Payer: Commercial Managed Care - PPO | Admitting: Orthopaedic Surgery

## 2023-02-20 DIAGNOSIS — S92301A Fracture of unspecified metatarsal bone(s), right foot, initial encounter for closed fracture: Secondary | ICD-10-CM

## 2023-02-20 NOTE — Progress Notes (Signed)
Office Visit Note   Patient: Vickie Lane           Date of Birth: May 28, 1961           MRN: [id number] Visit Date: 02/20/2023              Requested by: Mckinley Jewel, MD [address] [place],   [id number] PCP: Mckinley Jewel, MD   Assessment & Plan: Visit Diagnoses:  1. Multiple closed fractures of metatarsal bone of right foot, initial encounter     Plan: Impression is second third and fourth metatarsal base fractures.  Lisfranc complex is stable and anatomic.  We will plan to treat this nonoperatively.  Cam boot and nonweightbearing knee scooter handicap placard.  Rest ice compression elevation.  Recheck in 4 weeks.  Patient instructed to take a baby aspirin twice a day while she is nonweightbearing.  She will be nonweightbearing until follow-up in 4 weeks.  Follow-Up Instructions: Return in about 4 weeks (around 03/20/2023).   Orders:  No orders of the defined types were placed in this encounter.  No orders of the defined types were placed in this encounter.     Procedures: No procedures performed   Clinical Data: No additional findings.   Subjective: Chief Complaint  Patient presents with   Right Foot - Fracture    HPI  Patient is a well-known patient of mine who is following up from the ER for acute right foot injury.  Had a mechanical fall this past Friday.  She had multiple metatarsal base fractures.  She has swelling and bruising movement.  Review of Systems  Constitutional: Negative.   HENT: Negative.    Eyes: Negative.   Respiratory: Negative.    Cardiovascular: Negative.   Endocrine: Negative.   Musculoskeletal: Negative.   Neurological: Negative.   Hematological: Negative.   Psychiatric/Behavioral: Negative.    All other systems reviewed and are negative.    Objective: Vital Signs: There were no vitals taken for this visit.  Physical Exam Vitals and nursing note reviewed.  Constitutional:       Appearance: She is well-developed.  HENT:     Head: Normocephalic and atraumatic.  Pulmonary:     Effort: Pulmonary effort is normal.  Abdominal:     Palpations: Abdomen is soft.  Musculoskeletal:     Cervical back: Neck supple.  Skin:    General: Skin is warm.     Capillary Refill: Capillary refill takes less than 2 seconds.  Neurological:     Mental Status: She is alert and oriented to person, place, and time.  Psychiatric:        Behavior: Behavior normal.        Thought Content: Thought content normal.        Judgment: Judgment normal.     Ortho Exam  Examination right foot shows moderate ecchymosis and swelling.  No neurovascular compromise.  Specialty Comments:  No specialty comments available.  Imaging: No results found.   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/13/2022   Coronary artery disease 03/13/2022   Chronic kidney disease, stage 2 (mild) 03/13/2022   Allergic rhinitis  due to pollen 03/13/2022   Chronic kidney disease due to hypertension 03/13/2022   Sickle cell trait 01/31/2022   Hypertension associated with type 2 diabetes mellitus 11/06/2021   Diabetes mellitus 11/06/2021   Class 1 obesity due to excess calories with body mass index (BMI) of 31.0 to 31.9 in adult 09/11/2021   Carcinoma in situ 09/11/2021   Hyperlipidemia associated with type 2 diabetes mellitus 09/11/2021   Hypertension associated with diabetes 09/11/2021   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 w/one of my deliveries" (11/07/2012)   Hyperlipidemia 2019   LDL   Hypertension 1993   Joint pain 2011   osteo arthritis   Kidney problem 2008   Lactose intolerance    Mitral valve prolapse    Seizures ([facility]) 1991   "related to pregnancy; I had HELLP" (11/07/2012)- lastseizure was 1991 per pt    Sickle cell trait ([facility])    Systemic lupus erythematosus ([facility]) 1992   joint pain   Type II diabetes mellitus ([facility]) 08/2011   Diet and exercise.    Family History  Problem Relation Age of Onset   Hypertension Mother    Hypertension Father    Heart attack Father    Sudden death Father    Cancer Paternal Aunt        GYN (uterine/cervical); d. mid 40s   Heart disease Other    Hypertension Other    Colon cancer Neg Hx    Colon polyps Neg Hx    Rectal cancer Neg Hx    Stomach cancer Neg Hx    Esophageal cancer Neg Hx     Past Surgical History:  Procedure Laterality Date   ABDOMINAL HYSTERECTOMY  1997   BREAST LUMPECTOMY WITH RADIOACTIVE SEED LOCALIZATION Left 09/18/2022   Procedure: LEFT BREAST LUMPECTOMY WITH RADIOACTIVE SEED LOCALIZATION;  Surgeon: Donnie Mesa, MD;  Location: [facility];  Service: General;  Laterality: Left;   CARPAL TUNNEL RELEASE  2008   "right w/fracture OR" (11/07/2012)   [place]  11/07/2012   "L4-5" (11/07/2012)   TRIGGER FINGER RELEASE Right 05/04/2016   Procedure: MINOR RELEASE TRIGGER FINGER/A-1 PULLEY;  Surgeon: Charlotte Crumb, MD;  Location: [facility];  Service: Orthopedics;  Laterality: Right;   WRIST FRACTURE SURGERY  2008   "right" (11/07/2012)   Social History   Occupational History   Occupation: Lawyer: [employer]   Tobacco Use   Smoking status: Never   Smokeless tobacco: Never  Vaping Use   Vaping Use: Never used  Substance and Sexual Activity   Alcohol use: Yes    Comment: 11/07/2012 "once/month might have a glass of wine"   Drug use: No   Sexual activity: Yes    Birth control/protection: Surgical

## 2023-02-20 NOTE — Telephone Encounter (Signed)
Faxed completed PA form to [employer] at [phone] for Monovisc, left knee. PA pending 

## 2023-02-20 NOTE — Telephone Encounter (Signed)
-----   Message from Lendon Collar, RT sent at 02/20/2023 11:35 AM EDT ----- Can you check on the gel approval for this patient? She was in the office today and wanted to know an update.

## 2023-02-20 NOTE — Telephone Encounter (Signed)
Called and left a VM advising patient that a PA was required and once approved, I will give her a call to schedule.

## 2023-03-21 ENCOUNTER — Other Ambulatory Visit: Payer: Self-pay

## 2023-03-21 ENCOUNTER — Encounter: Payer: Self-pay | Admitting: Orthopaedic Surgery

## 2023-03-21 ENCOUNTER — Ambulatory Visit: Payer: Commercial Managed Care - PPO | Admitting: Orthopaedic Surgery

## 2023-03-21 ENCOUNTER — Other Ambulatory Visit (INDEPENDENT_AMBULATORY_CARE_PROVIDER_SITE_OTHER): Payer: Commercial Managed Care - PPO

## 2023-03-21 DIAGNOSIS — S92301A Fracture of unspecified metatarsal bone(s), right foot, initial encounter for closed fracture: Secondary | ICD-10-CM

## 2023-03-21 DIAGNOSIS — M94262 Chondromalacia, left knee: Secondary | ICD-10-CM

## 2023-03-21 NOTE — Progress Notes (Signed)
Office Visit Note   Patient: Vickie Lane           Date of Birth: 08-22-1961           MRN: [id number] Visit Date: 03/21/2023              Requested by: Ollen Bowl, MD [address] [place],  [place] [address] PCP: Ollen Bowl, MD   Assessment & Plan: Visit Diagnoses:  1. Multiple closed fractures of metatarsal bone of right foot, initial encounter     Plan: Impression is 62 year old female with right ankle sprain and nondisplaced Lisfranc fracture.  We will continue to keep her nonweightbearing for another 4 weeks with a boot and knee scooter.  Work note provided.  Recheck in 4 weeks with three-view x-rays of the right foot.  Follow-Up Instructions: Return in about 4 weeks (around 04/18/2023).   Orders:  Orders Placed This Encounter  Procedures   XR Foot Complete Right   XR Ankle Complete Right   No orders of the defined types were placed in this encounter.     Procedures: No procedures performed   Clinical Data: No additional findings.   Subjective: Chief Complaint  Patient presents with   Right Foot - Follow-up    HPI Patient returns today for follow-up of her right foot injury.  She has been ambulating with a knee scooter.  Overall the symptoms have improved. Review of Systems   Objective: Vital Signs: There were no vitals taken for this visit.  Physical Exam  Ortho Exam Examination right ankle shows tenderness to the ATFL and CFL ligaments.  There is no instability.  Her midfoot is slightly tender.  No swelling or skin changes. Specialty Comments:  No specialty comments available.  Imaging: XR Foot Complete Right  Result Date: 03/21/2023 Stable Lisfranc fracture.  Alignment remains anatomic and nondisplaced.  XR Ankle Complete Right  Result Date: 03/21/2023 Negative for acute findings.    PMFS History: Patient Active Problem List   Diagnosis Date Noted   Genetic testing 09/11/2022   Ductal carcinoma in  situ (DCIS) of left breast 08/31/2022   Systemic lupus erythematosus ([facility]) 03/13/2022   Pure hypercholesterolemia 03/13/2022   Menopause 03/13/2022   Localized, primary osteoarthritis of hand 03/13/2022   Gastro-esophageal reflux disease without esophagitis 03/13/2022   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   Coronary artery disease 03/13/2022   Chronic kidney disease, stage 2 (mild) 03/13/2022   Allergic rhinitis due to pollen 03/13/2022   Chronic kidney disease due to hypertension 03/13/2022   Sickle cell trait (HCC) 01/31/2022   Hypertension associated with type 2 diabetes mellitus (HCC) 11/06/2021   Diabetes mellitus (HCC) 11/06/2021   Class 1 obesity due to excess calories with body mass index (BMI) of 31.0 to 31.9 in adult 09/11/2021   Carcinoma in situ 09/11/2021   Hyperlipidemia associated with type 2 diabetes mellitus (HCC) 09/11/2021   Hypertension associated with diabetes (HCC) 09/11/2021   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 w/one of my deliveries" (11/07/2012)   Hyperlipidemia 2019   LDL   Hypertension  1993   Joint pain 2011   osteo arthritis   Kidney problem 2008   Lactose intolerance    Mitral valve prolapse    Seizures ([facility]) 1991   "related to pregnancy; I had HELLP" (11/07/2012)- lastseizure was 1991 per pt    Sickle cell trait (H[facility])    Systemic lupus erythematosus ([facility]) 1992   joint pain   Type II diabetes mellitus (H[facility]) 08/2011   Diet and exercise.    Family History  Problem Relation Age of Onset   Hypertension Mother    Hypertension Father    Heart attack Father    Sudden death Father    Cancer Paternal Aunt        GYN  (uterine/cervical); d. mid 68s   Heart disease Other    Hypertension Other    Colon cancer Neg Hx    Colon polyps Neg Hx    Rectal cancer Neg Hx    Stomach cancer Neg Hx    Esophageal cancer Neg Hx     Past Surgical History:  Procedure Laterality Date   ABDOMINAL HYSTERECTOMY  1997   BREAST LUMPECTOMY WITH RADIOACTIVE SEED LOCALIZATION Left 09/18/2022   Procedure: LEFT BREAST LUMPECTOMY WITH RADIOACTIVE SEED LOCALIZATION;  Surgeon: Manus Rudd, MD;  Location: [facility];  Service: General;  Laterality: Left;   CARPAL TUNNEL RELEASE  2008   "right w/fracture OR" (11/07/2012)   CESAREAN SECTION  1991, 1993   COLONOSCOPY     POLYPECTOMY     POSTERIOR LUMBAR FUSION  11/07/2012   "L4-5" (11/07/2012)   TRIGGER FINGER RELEASE Right 05/04/2016   Procedure: MINOR RELEASE TRIGGER FINGER/A-1 PULLEY;  Surgeon: Dairl Ponder, MD;  [facility];  Service: Orthopedics;  Laterality: Right;   WRIST FRACTURE SURGERY  2008   "right" (11/07/2012)   Social History   Occupational History   Occupation: Acupuncturist: [employer]  Tobacco Use   Smoking status: Never   Smokeless tobacco: Never  Vaping Use   Vaping Use: Never used  Substance and Sexual Activity   Alcohol use: Yes    Comment: 11/07/2012 "once/month might have a glass of wine"   Drug use: No   Sexual activity: Yes    Birth control/protection: Surgical

## 2023-04-19 ENCOUNTER — Other Ambulatory Visit (INDEPENDENT_AMBULATORY_CARE_PROVIDER_SITE_OTHER): Payer: Commercial Managed Care - PPO

## 2023-04-19 ENCOUNTER — Encounter: Payer: Self-pay | Admitting: Orthopaedic Surgery

## 2023-04-19 ENCOUNTER — Ambulatory Visit: Payer: Commercial Managed Care - PPO | Admitting: Orthopaedic Surgery

## 2023-04-19 DIAGNOSIS — S92301A Fracture of unspecified metatarsal bone(s), right foot, initial encounter for closed fracture: Secondary | ICD-10-CM | POA: Diagnosis not present

## 2023-04-19 DIAGNOSIS — M94262 Chondromalacia, left knee: Secondary | ICD-10-CM | POA: Diagnosis not present

## 2023-04-19 DIAGNOSIS — M1712 Unilateral primary osteoarthritis, left knee: Secondary | ICD-10-CM

## 2023-04-19 MED ORDER — HYALURONAN 88 MG/4ML IX SOSY
88.0000 mg | PREFILLED_SYRINGE | INTRA_ARTICULAR | Status: AC | PRN
  Administered 2023-04-19: 88 mg via INTRA_ARTICULAR

## 2023-04-19 NOTE — Progress Notes (Signed)
Office Visit Note   Patient: Vickie Lane           Date of Birth: August 10, 1961           MRN: [id number] Visit Date: 04/19/2023              Requested by: Ollen Bowl, MD [address] [place],  [place] [address] PCP: Ollen Bowl, MD   Assessment & Plan: Visit Diagnoses:  1. Multiple closed fractures of metatarsal bone of right foot, initial encounter   2. Chondromalacia of knee, left     Plan: Impression is left knee osteoarthritis and 8 weeks status post right Lisfranc injury.  Monovisc injection performed which she tolerated well.  Follow-up as needed.  For the Lisfranc this is stable on x-ray with evidence of fracture healing and consolidation.  And her symptoms are improving.  We will send her to physical therapy for gait training.  She may weight-bear as tolerated in a cam boot.  Follow-up in 6 weeks with repeat three-view x-rays of the right foot.  Follow-Up Instructions: No follow-ups on file.   Orders:  Orders Placed This Encounter  Procedures   XR Foot Complete Right   No orders of the defined types were placed in this encounter.     Procedures: Large Joint Inj: L knee on 04/19/2023 8:08 AM Indications: pain Details: 22 G needle  Arthrogram: No  Medications: 88 mg Hyaluronan 88 MG/4ML Outcome: tolerated well, no immediate complications Patient was prepped and draped in the usual sterile fashion.       Clinical Data: No additional findings.   Subjective: Chief Complaint  Patient presents with   Left Knee - Follow-up    monovisc    HPI Patient follows up today for right foot injury and left knee monovisc injection. Review of Systems   Objective: Vital Signs: There were no vitals taken for this visit.  Physical Exam  Ortho Exam Exam of the right foot shows improvement in swelling.  No real tenderness to palpation. Left knee exam is unchanged. Specialty Comments:  No specialty comments  available.  Imaging: No results found.   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   Coronary artery disease 03/13/2022   Chronic kidney disease, stage 2 (mild) 03/13/2022   Allergic rhinitis due to pollen 03/13/2022   Chronic kidney disease due to hypertension 03/13/2022   Sickle cell trait (HCC) 01/31/2022   Hypertension associated with type 2 diabetes mellitus (HCC) 11/06/2021   Diabetes mellitus ([facility]CC) 11/06/2021   Class 1 obesity due to excess calories with body mass index (BMI) of 31.0 to 31.9 in adult 09/11/2021   Carcinoma in situ 09/11/2021   Hyperlipidemia associated with type 2 diabetes mellitus (HCC) 09/11/2021   Hypertension associated with diabetes (HCC) 09/11/2021   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 w/one of my deliveries" (11/07/2012)   Hyperlipidemia 2019   LDL   Hypertension 1993   Joint pain 2011   osteo arthritis   Kidney problem 2008   Lactose intolerance    Mitral valve prolapse    Seizures ([facility]) 1991   "related to pregnancy; I had HELLP" (11/07/2012)- lastseizure was 1991 per pt    Sickle cell trait ([facility])    Systemic lupus erythematosus ([facility]) 1992   joint pain   Type II diabetes mellitus ([facility]) 08/2011   Diet and exercise.    Family History  Problem  Relation Age of Onset   Hypertension Mother    Hypertension Father    Heart attack Father    Sudden death Father    Cancer Paternal Aunt        GYN (uterine/cervical); d. mid 35s   Heart disease Other    Hypertension Other    Colon cancer Neg Hx    Colon polyps Neg Hx    Rectal cancer Neg Hx    Stomach cancer Neg Hx    Esophageal cancer Neg Hx     Past Surgical History:  Procedure Laterality Date   ABDOMINAL HYSTERECTOMY  1997   BREAST LUMPECTOMY WITH RADIOACTIVE SEED LOCALIZATION Left 09/18/2022   Procedure: LEFT BREAST LUMPECTOMY WITH RADIOACTIVE SEED LOCALIZATION;  Surgeon: Manus Rudd, MD;  Location: [facility];  Service: General;  Laterality: Left;   CARPAL TUNNEL RELEASE  2008   "right w/fracture OR" (11/07/2012)   CESAREAN SECTION  1991, 1993   COLONOSCOPY     POLYPECTOMY     POSTERIOR LUMBAR FUSION  11/07/2012   "L4-5" (11/07/2012)   TRIGGER FINGER RELEASE Right 05/04/2016   Procedure: MINOR RELEASE TRIGGER FINGER/A-1 PULLEY;  Surgeon: Dairl Ponder, MD;  Location: [facility];  Service: Orthopedics;  Laterality: Right;   WRIST FRACTURE SURGERY  2008   "right" (11/07/2012)   Social History   Occupational History   Occupation: Acupuncturist: [employer]  Tobacco Use   Smoking status: Never   Smokeless tobacco: Never  Vaping Use   Vaping Use: Never used  Substance and Sexual Activity   Alcohol use: Yes    Comment: 11/07/2012 "once/month might have a glass of wine"   Drug use: No   Sexual activity: Yes    Birth control/protection: Surgical

## 2023-04-29 ENCOUNTER — Encounter: Payer: Self-pay | Admitting: Physical Therapy

## 2023-04-29 ENCOUNTER — Other Ambulatory Visit: Payer: Self-pay

## 2023-04-29 ENCOUNTER — Ambulatory Visit: Payer: Commercial Managed Care - PPO | Admitting: Physical Therapy

## 2023-04-29 DIAGNOSIS — G8929 Other chronic pain: Secondary | ICD-10-CM | POA: Diagnosis not present

## 2023-04-29 DIAGNOSIS — M25571 Pain in right ankle and joints of right foot: Secondary | ICD-10-CM

## 2023-04-29 DIAGNOSIS — R2689 Other abnormalities of gait and mobility: Secondary | ICD-10-CM

## 2023-04-29 DIAGNOSIS — M25562 Pain in left knee: Secondary | ICD-10-CM | POA: Diagnosis not present

## 2023-04-29 DIAGNOSIS — R2681 Unsteadiness on feet: Secondary | ICD-10-CM | POA: Diagnosis not present

## 2023-04-29 DIAGNOSIS — M6281 Muscle weakness (generalized): Secondary | ICD-10-CM

## 2023-04-29 NOTE — Therapy (Signed)
OUTPATIENT PHYSICAL THERAPY LOWER EXTREMITY EVALUATION   Patient Name: Candida Vetter MRN: [id number] DOB:[birth year], 62 y.o., female Today's Date: 04/29/2023  END OF SESSION:  PT End of Session - 04/29/23 1242     Visit Number 1    Number of Visits 14    Date for PT Re-Evaluation 07/18/23    Authorization Type [facility] AET[employer]    PT Start Time 1100    PT Stop Time 1148    PT Time Calculation (min) 48 min    Activity Tolerance Patient tolerated treatment well    Behavior During Therapy [facility] for tasks assessed/performed             Past Medical History:  Diagnosis Date   Allergy 1990   Arthritis 2011   "back" (11/07/2012)   Breast cancer (HCC)    Chest pain    Chronic lower back pain    Constipation    GERD (gastroesophageal reflux disease) 1991   Heart murmur    "MVP" (`11/07/2012)   History of blood transfusion 1990's   "? w/one of my deliveries" (11/07/2012)   Hyperlipidemia 2019   LDL   Hypertension 1993   Joint pain 2011   osteo arthritis   Kidney problem 2008   Lactose intolerance    Mitral valve prolapse    Seizures (HCC) 1991   "related to pregnancy; I had HELLP" (11/07/2012)- lastseizure was 1991 per pt    Sickle cell trait (HCC)    Systemic lupus erythematosus (HCC) 1992   joint pain   Type II diabetes mellitus (HCC) 08/2011   Diet and exercise.   Past Surgical History:  Procedure Laterality Date   ABDOMINAL HYSTERECTOMY  1997   BREAST LUMPECTOMY WITH RADIOACTIVE SEED LOCALIZATION Left 09/18/2022   Procedure: LEFT BREAST LUMPECTOMY WITH RADIOACTIVE SEED LOCALIZATION;  Surgeon: Manus Rudd, MD;  Location: [facility];  Service: General;  Laterality: Left;   CARPAL TUNNEL RELEASE  2008   "right w/fracture OR" (11/07/2012)   CESAREAN SECTION  1991, 1993   COLONOSCOPY     POLYPECTOMY     POSTERIOR LUMBAR FUSION  11/07/2012   "L4-5" (11/07/2012)   TRIGGER FINGER RELEASE Right 05/04/2016   Procedure: MINOR RELEASE  TRIGGER FINGER/A-1 PULLEY;  Surgeon: Dairl Ponder, MD;  Location: [facility];  Service: Orthopedics;  Laterality: Right;   WRIST FRACTURE SURGERY  2008   "right" (11/07/2012)   Patient Active Problem List   Diagnosis Date Noted   Genetic testing 09/11/2022   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   Systemic lupus erythematosus (HCC) 03/13/2022   Pure hypercholesterolemia 03/13/2022   Menopause 03/13/2022   Localized, primary osteoarthritis of hand 03/13/2022   Gastro-esophageal reflux disease without esophagitis 03/13/2022   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   Coronary artery disease 03/13/2022   Chronic kidney disease, stage 2 (mild) 03/13/2022   Allergic rhinitis due to pollen 03/13/2022   Chronic kidney disease due to hypertension 03/13/2022   Sickle cell trait (HCC) 01/31/2022   Hypertension associated with type 2 diabetes mellitus (HCC) 11/06/2021   Diabetes mellitus (HCC) 11/06/2021   Class 1 obesity due to excess calories with body mass index (BMI) of 31.0 to 31.9 in adult 09/11/2021   Carcinoma in situ 09/11/2021   Hyperlipidemia associated with type 2 diabetes mellitus (HCC) 09/11/2021   Hypertension associated with diabetes (HCC) 09/11/2021   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    PCP: Ardean Larsen, MD  REFERRING PROVIDER: Coralyn Mark  Donnelly Stager, MD  REFERRING DIAG:  92.301A (ICD-10-CM) - Multiple closed fractures of metatarsal bone of right foot, initial encounter  M94.262 (ICD-10-CM) - Chondromalacia of knee, left    THERAPY DIAG:  Pain in right ankle and joints of right foot  Chronic pain of left knee  Muscle weakness (generalized)  Other abnormalities of gait and mobility  Unsteadiness on feet  Rationale for Evaluation and Treatment: Rehabilitation  ONSET DATE: 02/15/2023 injury  SUBJECTIVE:   SUBJECTIVE STATEMENT: On 02/15/2023 she tripped over speaker tripod with right foot fracture. She has history of  arthritic left knee pain.  Her left knee pain increased.  She was NWBing RLE until 04/19/23.  She arrived to PT walking with no device with CAM boot. She reports not using CAM boot in home.   PERTINENT HISTORY: Breast CA with lumpectomy 09/18/2022, posterior Lumbar Fusion L4-5, Lupus, sickle cell trait, OA, DM2, renal disease, HTN,   PAIN:  NPRS scale: today 2/10 and in last week 0/10 - 2/10 Pain location: right foot instep top & bottom Pain description: dull ache Aggravating factors: putting weight on it without boot Relieving factors: get off it / rest  Left knee pain today 5/10 & over last week 2/10 - 10/10 Left knee anterior medial Sharp at times, others constant ache Aggravating: upon arising, walking longer times 10-15 min, stairs Relieving: stop walking, tylenol arthritis & night Aleve  PRECAUTIONS: None  WEIGHT BEARING RESTRICTIONS: Yes LLE WBAT in CAM boot 04/19/2023 for 6 weeks  FALLS:  Has patient fallen in last 6 months? Yes. Number of falls 1  LIVING ENVIRONMENT: Lives with: lives alone currently finance & his mother Lives in: House hers is tri-level and finance is tri-level Stairs: Yes: Internal: 6 mid to upper right rail & lowest to mid level left rail 12 steps; on right going up and on left going up and External: 3 steps; none Has following equipment at home: Crutches, shower chair, and kneel walker  OCCUPATION: nurse at [employer], sedentary job.  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  PATIENT SURVEYS:  FOTO intake:  43%  predicted:  53%  COGNITION: Overall cognitive status: WFL    SENSATION: WFL  EDEMA:  LLE: above knee 57.7 cm,  around knee 48 cm, above malleoli 21 cm, arch of foot  24.6 cm RLE: above knee 58cm,  around knee 47.8 cm, above malleoli 21 cm, arch of foot 23.2 cm  POSTURE: rounded shoulders, forward head, and weight shift left  PALPATION: Right foot tenderness in arch.  Left knee tenderness medial joint line.   LOWER EXTREMITY ROM:   ROM Right eval Left eval  Hip flexion    Hip extension    Hip abduction    Hip adduction    Hip internal rotation    Hip external rotation    Knee flexion  Seated  A: WFL  Knee extension  A: 0*  Ankle dorsiflexion Knee extended A: -4* P: +4*   Ankle plantarflexion WFL   Ankle inversion    Ankle eversion     (Blank rows = not tested)  LOWER EXTREMITY MMT:  MMT Right eval Left eval  Hip flexion    Hip extension    Hip abduction  Hip adduction    Hip internal rotation    Hip external rotation    Knee flexion  4/5  Knee extension  4/5 Limited by pain  Ankle dorsiflexion 4/5   Ankle plantarflexion Seated Gross 3/5   Ankle inversion 4/5   Ankle eversion 4/5    (Blank rows = not tested)  FUNCTIONAL TESTS:  18 inch chair transfer: uses UEs to arise  GAIT: Distance walked: 100' Assistive device utilized:  CAM boot and None Level of assistance: Modified independence Comments:    TODAY'S TREATMENT                                                                          DATE: 04/29/2023: Therex:    HEP instruction/performance c cues for techniques, handout provided.  Trial set performed of each for comprehension and symptom assessment.  See below for exercise list  Gait Training: PT advised of MD note that states she is to use CAM boot for weight bearing until next MD visit.  She should wear supportive tennis shoes on LLE and use of heel wedge to level LE (due to CAM boot) may minimize knee / back pain.   PATIENT EDUCATION:  Education details: HEP, POC, see gait above Person educated: Patient Education method: Explanation, Demonstration, Verbal cues, and Handouts Education comprehension:  verbalized understanding, returned demonstration, and verbal cues required  HOME EXERCISE PROGRAM: Access Code: [email or web address] URL: [email or web address] Date: 04/29/2023 Prepared by: Vladimir Faster  Exercises - Assisted ankle dorsiflexion with strap or towel  - 2-3 x daily - 7 x weekly - 1 sets - 3 reps - 20-30 seconds hold - Long Sitting Soleus Stretch on Bolster with Strap  - 2-3 x daily - 7 x weekly - 1 sets - 3 reps - 20-30 seconds hold - Ankle and Toe Plantarflexion with green double (looped) Resistance  - 2 x daily - 7 x weekly - 2-3 sets - 10 reps - 5 seconds hold - Ankle Dorsiflexion with red single Resistance  - 2 x daily - 7 x weekly - 2-3 sets - 10 reps - 5 seconds hold - Ankle Inversion with red single Resistance  - 2 x daily - 7 x weekly - 2-3 sets - 10 reps - 5 seconds hold - Ankle Eversion with red single Resistance  - 2 x daily - 7 x weekly - 2-3 sets - 10 reps - 5 seconds hold  ASSESSMENT: CLINICAL IMPRESSION: Patient is a 62 y.o. who comes to clinic with complaints of right foot & left knee pain with mobility, strength and movement coordination deficits that impair their ability to perform usual daily and recreational functional activities without increase difficulty/symptoms at this time.  Patient to benefit from skilled PT services to address impairments and limitations to improve to previous level of function without restriction secondary to condition.   OBJECTIVE IMPAIRMENTS: Abnormal gait, decreased activity tolerance, decreased balance, decreased endurance, decreased knowledge of use of DME, decreased mobility, decreased ROM, decreased strength, increased edema, and pain.   ACTIVITY LIMITATIONS: carrying, lifting, standing, stairs, and locomotion level  PARTICIPATION LIMITATIONS: driving, community activity, occupation, and recreation  PERSONAL FACTORS: 1-2 comorbidities: see PMH  are also affecting patient's functional outcome.   REHAB POTENTIAL:  Good  CLINICAL DECISION MAKING: Stable/uncomplicated  EVALUATION COMPLEXITY: Low   GOALS: Goals reviewed with patient? Yes  SHORT TERM GOALS: (target date for Short term goals 06/03/2023)   1.  Patient will demonstrate independent use of home exercise program to maintain progress from in clinic treatments.  Goal status: New  2. PROM right ankle DF +15* Goal status: New  3.FOTO >50% Goal status: New  LONG TERM GOALS: (target dates for all long term goals  07/18/2023 )   1. Patient will demonstrate/report pain at worst less than or equal to 2/10 to facilitate minimal limitation in daily activity secondary to pain symptoms.  Goal status: New   2. Patient will demonstrate independent use of home exercise program to facilitate ability to maintain/progress functional gains from skilled physical therapy services.  Goal status: New   3. Patient will demonstrate FOTO outcome > or = 53 % to indicate reduced disability due to condition.  Goal status: New   4.  Patient will demonstrate right ankle & left knee LE MMT 5/5 throughout to faciltiate usual transfers, stairs, squatting at [facility] for daily life.   Goal status: New   5.  Patient ambulates >500' and neg ramps/curbs independently.  Goal status: New    PLAN:  PT FREQUENCY:  2x/week for 2 weeks, then 1x/wk for 10 weeks  PT DURATION: 12 weeks  PLANNED INTERVENTIONS: Therapeutic exercises, Therapeutic activity, Neuro Muscular re-education, Balance training, Gait training, Patient/Family education, Joint mobilization, Stair training, DME instructions, Dry Needling, Electrical stimulation, Traction, Cryotherapy, vasopneumatic deviceMoist heat, Taping, Ultrasound, Ionotophoresis 4mg /ml Dexamethasone, and aquatic therapy, Manual therapy.  All included unless contraindicated  PLAN FOR NEXT SESSION: check HEP, update ankle exercises and add knee exercises to HEP.     Vladimir Faster, PT, DPT 04/29/2023, 1:30 PM

## 2023-05-02 ENCOUNTER — Encounter: Payer: Self-pay | Admitting: Rehabilitative and Restorative Service Providers"

## 2023-05-02 ENCOUNTER — Ambulatory Visit: Payer: Commercial Managed Care - PPO | Admitting: Rehabilitative and Restorative Service Providers"

## 2023-05-02 DIAGNOSIS — M6281 Muscle weakness (generalized): Secondary | ICD-10-CM

## 2023-05-02 DIAGNOSIS — R2681 Unsteadiness on feet: Secondary | ICD-10-CM

## 2023-05-02 DIAGNOSIS — M25571 Pain in right ankle and joints of right foot: Secondary | ICD-10-CM | POA: Diagnosis not present

## 2023-05-02 DIAGNOSIS — G8929 Other chronic pain: Secondary | ICD-10-CM | POA: Diagnosis not present

## 2023-05-02 DIAGNOSIS — M25562 Pain in left knee: Secondary | ICD-10-CM

## 2023-05-02 DIAGNOSIS — R2689 Other abnormalities of gait and mobility: Secondary | ICD-10-CM | POA: Diagnosis not present

## 2023-05-02 NOTE — Therapy (Signed)
OUTPATIENT PHYSICAL THERAPY LOWER EXTREMITY TREATMENT   Patient Name: Vickie Lane MRN: [id number] DOB:03-11-1961, 62 y.o., female Today's Date: 05/02/2023  END OF SESSION:  PT End of Session - 05/02/23 1601     Visit Number 2    Number of Visits 14    Date for PT Re-Evaluation 07/18/23    Authorization Type [employer]    Authorization - Visit Number 2    Progress Note Due on Visit 14    PT Start Time 1516    PT Stop Time 1558    PT Time Calculation (min) 42 min    Activity Tolerance Patient tolerated treatment well;No increased pain    Behavior During Therapy [facility] for tasks assessed/performed              Past Medical History:  Diagnosis Date   Allergy 1990   Arthritis 2011   "back" (11/07/2012)   Breast cancer (HCC)    Chest pain    Chronic lower back pain    Constipation    GERD (gastroesophageal reflux disease) 1991   Heart murmur    "MVP" (`11/07/2012)   History of blood transfusion 1990's   "? w/one of my deliveries" (11/07/2012)   Hyperlipidemia 2019   LDL   Hypertension 1993   Joint pain 2011   osteo arthritis   Kidney problem 2008   Lactose intolerance    Mitral valve prolapse    Seizures (HCC) 1991   "related to pregnancy; I had HELLP" (11/07/2012)- lastseizure was 1991 per pt    Sickle cell trait (HCC)    Systemic lupus erythematosus (HCC) 1992   joint pain   Type II diabetes mellitus (HCC) 08/2011   Diet and exercise.   Past Surgical History:  Procedure Laterality Date   ABDOMINAL HYSTERECTOMY  1997   BREAST LUMPECTOMY WITH RADIOACTIVE SEED LOCALIZATION Left 09/18/2022   Procedure: LEFT BREAST LUMPECTOMY WITH RADIOACTIVE SEED LOCALIZATION;  Surgeon: Manus Rudd, MD;  Location: [facility];  Service: General;  Laterality: Left;   CARPAL TUNNEL RELEASE  2008   "right w/fracture OR" (11/07/2012)   CESAREAN SECTION  1991, 1993   COLONOSCOPY     POLYPECTOMY     POSTERIOR LUMBAR FUSION  11/07/2012   "L4-5"  (11/07/2012)   TRIGGER FINGER RELEASE Right 05/04/2016   Procedure: MINOR RELEASE TRIGGER FINGER/A-1 PULLEY;  Surgeon: Dairl Ponder, MD;  Location: [facility];  Service: Orthopedics;  Laterality: Right;   WRIST FRACTURE SURGERY  2008   "right" (11/07/2012)   Patient Active Problem List   Diagnosis Date Noted   Genetic testing 09/11/2022   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   Systemic lupus erythematosus (HCC) 03/13/2022   Pure hypercholesterolemia 03/13/2022   Menopause 03/13/2022   Localized, primary osteoarthritis of hand 03/13/2022   Gastro-esophageal reflux disease without esophagitis 03/13/2022   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   Coronary artery disease 03/13/2022   Chronic kidney disease, stage 2 (mild) 03/13/2022   Allergic rhinitis due to pollen 03/13/2022   Chronic kidney disease due to hypertension 03/13/2022   Sickle cell trait (HCC) 01/31/2022   Hypertension associated with type 2 diabetes mellitus (HCC) 11/06/2021   Diabetes mellitus (HCC) 11/06/2021   Class 1 obesity due to excess calories with body mass index (BMI) of 31.0 to 31.9 in adult 09/11/2021   Carcinoma in situ 09/11/2021   Hyperlipidemia associated with type 2 diabetes mellitus (HCC) 09/11/2021   Hypertension associated with diabetes (HCC)  09/11/2021   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  She reports she is now using her CAM boot in the home.   PERTINENT HISTORY: Breast CA with lumpectomy 09/18/2022, posterior Lumbar Fusion L4-5, Lupus, sickle cell trait, OA, DM2, renal disease, HTN,   PAIN:  NPRS scale: today 2/10 and in last week 0/10 - 2/10 Pain location: right foot instep top & bottom Pain description: dull ache Aggravating factors: putting weight on it without boot Relieving factors: get off it / rest  Left knee pain today 5/10 & over last week 2/10 - 10/10 Left knee anterior medial Sharp at times, others constant ache Aggravating: upon arising, walking longer times 10-15 min, stairs Relieving: stop walking, tylenol arthritis & night Aleve  PRECAUTIONS: None  WEIGHT BEARING RESTRICTIONS: Yes LLE WBAT in CAM boot 04/19/2023 for 6 weeks  FALLS:  Has patient fallen in last 6 months? Yes. 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  PATIENT SURVEYS:  FOTO intake:  43%  predicted:  53%  COGNITION: Overall cognitive status: WFL    SENSATION: WFL  EDEMA:  LLE: above knee 57.7 cm,  around knee 48 cm, above malleoli 21 cm, arch of foot 24.6 cm RLE: above knee 58cm,  around knee 47.8 cm, above malleoli 21 cm, arch of foot 23.2  cm  POSTURE: rounded shoulders, forward head, and weight shift left  PALPATION: Right foot tenderness in arch.  Left knee tenderness medial joint line.   LOWER EXTREMITY ROM:   ROM Right eval Left eval  Hip flexion    Hip extension    Hip abduction    Hip adduction    Hip internal rotation    Hip external rotation    Knee flexion  Seated  A: WFL  Knee extension  A: 0*  Ankle dorsiflexion Knee extended A: -4* P: +4*   Ankle plantarflexion WFL   Ankle inversion    Ankle eversion     (Blank rows = not tested)  LOWER EXTREMITY MMT:  MMT Right eval Left eval  Hip flexion    Hip extension    Hip abduction    Hip adduction    Hip internal rotation  Hip external rotation    Knee flexion  4/5  Knee extension  4/5 Limited by pain  Ankle dorsiflexion 4/5   Ankle plantarflexion Seated Gross 3/5   Ankle inversion 4/5   Ankle eversion 4/5    (Blank rows = not tested)  FUNCTIONAL TESTS:  18 inch chair transfer: uses UEs to arise  GAIT: Distance walked: 100' Assistive device utilized:  CAM boot and None Level of assistance: Modified independence Comments:    TODAY'S TREATMENT                                                                          DATE: 05/02/2023: Seated upper heel cord stretch with strap 5 x 20 seconds Seated lower heel cord stretch with strap 5 x 20 seconds Thera-band ankle 4 way with green thera-band and slow eccentrics 15 x each direction Quadriceps sets 10 x 5 seconds Side lie hip abduction 10 x 3 seconds bilateral Prone alternating hip extensions 10 x 3 seconds    04/29/2023: Therex:    HEP instruction/performance c cues for techniques, handout provided.  Trial set performed of each for comprehension and symptom assessment.  See below for exercise list  Gait Training: PT advised of MD note that states she is to use CAM boot for weight bearing until next MD visit.  She should wear supportive tennis shoes on LLE and use of heel wedge to  level LE (due to CAM boot) may minimize knee / back pain.   PATIENT EDUCATION:  Education details: HEP, POC, see gait above Person educated: Patient Education method: Explanation, Demonstration, Verbal cues, and Handouts Education comprehension: verbalized understanding, returned demonstration, and verbal cues required  HOME EXERCISE PROGRAM: Access Code: [email or web address] URL: [email or web address] Date: 04/29/2023 Prepared by: Vladimir Faster  Exercises - Assisted ankle dorsiflexion with strap or towel  - 2-3 x daily - 7 x weekly - 1 sets - 3 reps - 20-30 seconds hold - Long Sitting Soleus Stretch on Bolster with Strap  - 2-3 x daily - 7 x weekly - 1 sets - 3 reps - 20-30 seconds hold - Ankle and Toe Plantarflexion with green double (looped) Resistance  - 2 x daily - 7 x weekly - 2-3 sets - 10 reps - 5 seconds hold - Ankle Dorsiflexion with red single Resistance  - 2 x daily - 7 x weekly - 2-3 sets - 10 reps - 5 seconds hold - Ankle Inversion with red single Resistance  - 2 x daily - 7 x weekly - 2-3 sets - 10 reps - 5 seconds hold - Ankle Eversion with red single Resistance  - 2 x daily - 7 x weekly - 2-3 sets - 10 reps - 5 seconds hold  ASSESSMENT: CLINICAL IMPRESSION: Shandrell reports and demonstrates good early compliance with her HEP.  We added a few general lower extremity strengthening activities to avoid knee, hip and back pain flare-ups with her recent transition to the boot from a scooter.  Continue supervised PT to meet long-term goals.  OBJECTIVE IMPAIRMENTS: Abnormal gait, decreased activity tolerance, decreased balance, decreased endurance, decreased knowledge of use of DME, decreased mobility, decreased ROM, decreased strength, increased edema, and pain.   ACTIVITY LIMITATIONS: carrying, lifting, standing, stairs, and  locomotion level  PARTICIPATION LIMITATIONS: driving, community activity, occupation, and recreation  PERSONAL FACTORS: 1-2 comorbidities: see PMH   are also affecting patient's functional outcome.   REHAB POTENTIAL: Good  CLINICAL DECISION MAKING: Stable/uncomplicated  EVALUATION COMPLEXITY: Low   GOALS: Goals reviewed with patient? Yes  SHORT TERM GOALS: (target date for Short term goals 06/03/2023)   1.  Patient will demonstrate independent use of home exercise program to maintain progress from in clinic treatments.  Goal status: On Going 05/02/2023  2. PROM right ankle DF +15* Goal status: New  3.FOTO >50% Goal status: New  LONG TERM GOALS: (target dates for all long term goals  07/18/2023 )   1. Patient will demonstrate/report pain at worst less than or equal to 2/10 to facilitate minimal limitation in daily activity secondary to pain symptoms.  Goal status: New   2. Patient will demonstrate independent use of home exercise program to facilitate ability to maintain/progress functional gains from skilled physical therapy services.  Goal status: New   3. Patient will demonstrate FOTO outcome > or = 53 % to indicate reduced disability due to condition.  Goal status: New   4.  Patient will demonstrate right ankle & left knee LE MMT 5/5 throughout to faciltiate usual transfers, stairs, squatting at [facility] for daily life.   Goal status: New   5.  Patient ambulates >500' and neg ramps/curbs independently.  Goal status: New    PLAN:  PT FREQUENCY:  2x/week for 2 weeks, then 1x/wk for 10 weeks  PT DURATION: 12 weeks  PLANNED INTERVENTIONS: Therapeutic exercises, Therapeutic activity, Neuro Muscular re-education, Balance training, Gait training, Patient/Family education, Joint mobilization, Stair training, DME instructions, Dry Needling, Electrical stimulation, Traction, Cryotherapy, vasopneumatic deviceMoist heat, Taping, Ultrasound, Ionotophoresis 4mg /ml Dexamethasone, and aquatic therapy, Manual therapy.  All included unless contraindicated  PLAN FOR NEXT SESSION: Uupdate ankle exercises and add knee, hip, back  exercises to HEP as needed.   Cherlyn Cushing, PT, MPT 05/02/2023, 4:06 PM

## 2023-05-06 ENCOUNTER — Encounter: Payer: Self-pay | Admitting: Physical Therapy

## 2023-05-06 ENCOUNTER — Ambulatory Visit: Payer: Commercial Managed Care - PPO | Admitting: Physical Therapy

## 2023-05-06 DIAGNOSIS — M25571 Pain in right ankle and joints of right foot: Secondary | ICD-10-CM | POA: Diagnosis not present

## 2023-05-06 DIAGNOSIS — R2681 Unsteadiness on feet: Secondary | ICD-10-CM | POA: Diagnosis not present

## 2023-05-06 DIAGNOSIS — R2689 Other abnormalities of gait and mobility: Secondary | ICD-10-CM | POA: Diagnosis not present

## 2023-05-06 DIAGNOSIS — G8929 Other chronic pain: Secondary | ICD-10-CM | POA: Diagnosis not present

## 2023-05-06 DIAGNOSIS — M6281 Muscle weakness (generalized): Secondary | ICD-10-CM

## 2023-05-06 DIAGNOSIS — M25562 Pain in left knee: Secondary | ICD-10-CM | POA: Diagnosis not present

## 2023-05-06 NOTE — Therapy (Signed)
OUTPATIENT PHYSICAL THERAPY LOWER EXTREMITY TREATMENT   Patient Name: Vickie Lane MRN: [id number] DOB:1961-09-11, 62 y.o., female Today's Date: 05/06/2023  END OF SESSION:  PT End of Session - 05/06/23 1150     Visit Number 3    Number of Visits 14    Date for PT Re-Evaluation 07/18/23    Authorization Type [place] AET[employer]    Progress Note Due on Visit 14    PT Start Time 1150    PT Stop Time 1235    PT Time Calculation (min) 45 min    Activity Tolerance Patient tolerated treatment well;No increased pain    Behavior During Therapy [facility] for tasks assessed/performed              Past Medical History:  Diagnosis Date   Allergy 1990   Arthritis 2011   "back" (11/07/2012)   Breast cancer (HCC)    Chest pain    Chronic lower back pain    Constipation    GERD (gastroesophageal reflux disease) 1991   Heart murmur    "MVP" (`11/07/2012)   History of blood transfusion 1990's   "? w/one of my deliveries" (11/07/2012)   Hyperlipidemia 2019   LDL   Hypertension 1993   Joint pain 2011   osteo arthritis   Kidney problem 2008   Lactose intolerance    Mitral valve prolapse    Seizures (HCC) 1991   "related to pregnancy; I had HELLP" (11/07/2012)- lastseizure was 1991 per pt    Sickle cell trait (HCC)    Systemic lupus erythematosus (HCC) 1992   joint pain   Type II diabetes mellitus (HCC) 08/2011   Diet and exercise.   Past Surgical History:  Procedure Laterality Date   ABDOMINAL HYSTERECTOMY  1997   BREAST LUMPECTOMY WITH RADIOACTIVE SEED LOCALIZATION Left 09/18/2022   Procedure: LEFT BREAST LUMPECTOMY WITH RADIOACTIVE SEED LOCALIZATION;  Surgeon: Manus Rudd, MD;  Location: [facility];  Service: General;  Laterality: Left;   CARPAL TUNNEL RELEASE  2008   "right w/fracture OR" (11/07/2012)   CESAREAN SECTION  1991, 1993   COLONOSCOPY     POLYPECTOMY     POSTERIOR LUMBAR FUSION  11/07/2012   "L4-5" (11/07/2012)   TRIGGER FINGER  RELEASE Right 05/04/2016   Procedure: MINOR RELEASE TRIGGER FINGER/A-1 PULLEY;  Surgeon: Dairl Ponder, MD;  Location: [facility];  Service: Orthopedics;  Laterality: Right;   WRIST FRACTURE SURGERY  2008   "right" (11/07/2012)   Patient Active Problem List   Diagnosis Date Noted   Genetic testing 09/11/2022   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   Systemic lupus erythematosus (HCC) 03/13/2022   Pure hypercholesterolemia 03/13/2022   Menopause 03/13/2022   Localized, primary osteoarthritis of hand 03/13/2022   Gastro-esophageal reflux disease without esophagitis 03/13/2022   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   Coronary artery disease 03/13/2022   Chronic kidney disease, stage 2 (mild) 03/13/2022   Allergic rhinitis due to pollen 03/13/2022   Chronic kidney disease due to hypertension 03/13/2022   Sickle cell trait (HCC) 01/31/2022   Hypertension associated with type 2 diabetes mellitus (HCC) 11/06/2021   Diabetes mellitus (HCC) 11/06/2021   Class 1 obesity due to excess calories with body mass index (BMI) of 31.0 to 31.9 in adult 09/11/2021   Carcinoma in situ 09/11/2021   Hyperlipidemia associated with type 2 diabetes mellitus (HCC) 09/11/2021   Hypertension associated with diabetes (HCC) 09/11/2021   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    PCP: Ardean Larsen, MD  REFERRING PROVIDER: Tarry Kos, MD  REFERRING DIAG:  92.301A (ICD-10-CM) - Multiple closed fractures of metatarsal bone of right foot, initial encounter  M94.262 (ICD-10-CM) - Chondromalacia of knee, left    THERAPY DIAG:  Pain in right ankle and joints of right foot  Chronic pain of left knee  Muscle weakness (generalized)  Other abnormalities of gait and mobility  Unsteadiness on feet  Rationale for Evaluation and Treatment: Rehabilitation  ONSET DATE: 02/15/2023 injury  SUBJECTIVE:   SUBJECTIVE STATEMENT: She has been using CAM boot when out of  bed except in middle of night to toilet.  Her left knee continues to be painful.  She has heel lift in left shoe to compensate for height difference with CAM boot. She has not been using cane or walker.   PERTINENT HISTORY: Breast CA with lumpectomy 09/18/2022, posterior Lumbar Fusion L4-5, Lupus, sickle cell trait, OA, DM2, renal disease, HTN,   PAIN:  NPRS scale: today 2/10 and in last week 0/10 - 2/10 Pain location: right foot instep top & bottom Pain description: dull ache Aggravating factors: putting weight on it without boot Relieving factors: get off it / rest  Left knee pain today 5/10 & over last week 2/10 - 10/10 Left knee anterior medial Sharp at times, others constant ache Aggravating: upon arising, walking longer times 10-15 min, stairs Relieving: stop walking, tylenol arthritis & night Aleve  PRECAUTIONS: None  WEIGHT BEARING RESTRICTIONS: Yes LLE WBAT in CAM boot 04/19/2023 for 6 weeks  FALLS:  Has patient fallen in last 6 months? Yes. Number of falls 1  LIVING ENVIRONMENT: Lives with: lives alone currently finance & his mother Lives in: House hers is tri-level and finance is tri-level Stairs: Yes: Internal: 6 mid to upper right rail & lowest to mid level left rail 12 steps; on right going up and on left going up and External: 3 steps; none Has following equipment at home: Crutches, shower chair, and kneel walker  OCCUPATION: nurse at [employer], sedentary job.  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  PATIENT SURVEYS:  FOTO intake:  43%  predicted:  53%  COGNITION: Overall cognitive status: WFL    SENSATION: WFL  EDEMA:  LLE: above knee 57.7 cm,  around knee 48 cm, above malleoli 21 cm,  arch of foot 24.6 cm RLE: above knee 58cm,  around knee 47.8 cm, above malleoli 21 cm, arch of foot 23.2 cm  POSTURE: rounded shoulders, forward head, and weight shift left  PALPATION: Right foot tenderness in arch.  Left knee tenderness medial joint line.   LOWER EXTREMITY ROM:   ROM Right eval Left eval Right  05/06/23  Hip flexion     Hip extension     Hip abduction     Hip adduction     Hip internal rotation     Hip external rotation     Knee flexion  Seated  A: WFL   Knee extension  A: 0*   Ankle dorsiflexion Knee extended A: -4* P: +4*  Knee extended A: 15*  Ankle plantarflexion WFL    Ankle inversion   Prone A: 16*  Ankle eversion   Prone A: 4*   (  Blank rows = not tested)  LOWER EXTREMITY MMT:  MMT Right eval Left eval  Hip flexion    Hip extension    Hip abduction    Hip adduction    Hip internal rotation    Hip external rotation    Knee flexion  4/5  Knee extension  4/5 Limited by pain  Ankle dorsiflexion 4/5   Ankle plantarflexion Seated Gross 3/5   Ankle inversion 4/5   Ankle eversion 4/5    (Blank rows = not tested)  FUNCTIONAL TESTS:  18 inch chair transfer: uses UEs to arise  GAIT: Distance walked: 100' Assistive device utilized:  CAM boot and None Level of assistance: Modified independence Comments:    TODAY'S TREATMENT                                                                          DATE: 05/06/2023: Therapeutic Exercise: Seated gastroc with strap 3 reps x 20 seconds BLEs Seated heel raises BLEs 3 sec hold 15 reps Thera-band ankle PF with green Theraband RLE 15 reps Seated LLE hamstring curl green theraband 15 reps Ankle DF, inversion & eversion red theraband 15 reps LLE SAQ attempted 5# but eccentric pain. 10 reps no wt with some anterior medial knee pain. Prone knee flexion no wt 15 reps Prone alternating hip extensions 10 x 3 seconds  Sidelying LLE hip adduction 10 reps Sidelying hip abduction 10 reps BLEs. Quad  stretch supine hooklying strap knee flexion 20 sec 3 reps Hamstring stretch supine SLR strap 20 sec 2 reps Hip adduction & abduction stretch supine strap moving straight leg moving LLE across midline & laterally 20 sec 2 reps ea Pt reports active knee motion less painful after knee exercises including stretches.  Self-care: PT instructed in cane use contralateral to CAM boot.  Pt amb with cane safely reporting less knee pain. PT advised use of cane for gait outside home and if knee pain >5/10 in home. Pt verbalized understanding. PT demo & verbal cues on use of 24" bar stool including sit to/from stand for ADLs in kitchen & bathroom. PT advised use to decrease standing time with knee pain. Pt verbalized understanding.    05/02/2023: Seated upper heel cord stretch with strap 5 x 20 seconds Seated lower heel cord stretch with strap 5 x 20 seconds Thera-band ankle 4 way with green thera-band and slow eccentrics 15 x each direction Quadriceps sets 10 x 5 seconds Side lie hip abduction 10 x 3 seconds bilateral Prone alternating hip extensions 10 x 3 seconds    04/29/2023: Therex:    HEP instruction/performance c cues for techniques, handout provided.  Trial set performed of each for comprehension and symptom assessment.  See below for exercise list  Gait Training: PT advised of MD note that states she is to use CAM boot for weight bearing until next MD visit.  She should wear supportive tennis shoes on LLE and use of heel wedge to level LE (due to CAM boot) may minimize knee / back pain.   PATIENT EDUCATION:  Education details: HEP, POC, see gait above Person educated: Patient Education method: Explanation, Demonstration, Verbal cues, and Handouts Education comprehension: verbalized understanding, returned demonstration, and verbal cues required  HOME EXERCISE PROGRAM: Access Code: [email or web address] URL: [email or web address] Date: 04/29/2023 Prepared by: Vladimir Faster  Exercises - Assisted ankle dorsiflexion with strap or towel  - 2-3 x daily - 7 x weekly - 1 sets - 3 reps - 20-30 seconds hold - Long Sitting Soleus Stretch on Bolster with Strap  - 2-3 x daily - 7 x weekly - 1 sets - 3 reps - 20-30 seconds hold - Ankle and Toe Plantarflexion with green double (looped) Resistance  - 2 x daily - 7 x weekly - 2-3 sets - 10 reps - 5 seconds hold - Ankle Dorsiflexion with red single Resistance  - 2 x daily - 7 x weekly - 2-3 sets - 10 reps - 5 seconds hold - Ankle Inversion with red single Resistance  - 2 x daily - 7 x weekly - 2-3 sets - 10 reps - 5 seconds hold - Ankle Eversion with red single Resistance  - 2 x daily - 7 x weekly - 2-3 sets - 10 reps - 5 seconds hold  ASSESSMENT: CLINICAL IMPRESSION: Patient reported less knee pain after performing exercises including stretches.  Patient improved right ankle dorsiflexion range.  She appears to understand PT recommendations for use of cane for gait & bar stool for standing to decrease knee pain.    OBJECTIVE IMPAIRMENTS: Abnormal gait, decreased activity tolerance, decreased balance, decreased endurance, decreased knowledge of use of DME, decreased mobility, decreased ROM, decreased strength, increased edema, and pain.   ACTIVITY LIMITATIONS: carrying, lifting, standing, stairs, and locomotion level  PARTICIPATION LIMITATIONS: driving, community activity, occupation, and recreation  PERSONAL FACTORS: 1-2 comorbidities: see PMH  are also affecting patient's functional outcome.   REHAB POTENTIAL: Good  CLINICAL DECISION MAKING: Stable/uncomplicated  EVALUATION COMPLEXITY: Low   GOALS: Goals reviewed with patient? Yes  SHORT TERM GOALS: (target date for Short term goals 06/03/2023)   1.  Patient will demonstrate independent use of home exercise program to maintain progress from in clinic treatments.  Goal status: On Going 05/02/2023  2. PROM right ankle DF +15* Goal status: New  3.FOTO  >50% Goal status: New  LONG TERM GOALS: (target dates for all long term goals  07/18/2023 )   1. Patient will demonstrate/report pain at worst less than or equal to 2/10 to facilitate minimal limitation in daily activity secondary to pain symptoms.  Goal status: New   2. Patient will demonstrate independent use of home exercise program to facilitate ability to maintain/progress functional gains from skilled physical therapy services.  Goal status: New   3. Patient will demonstrate FOTO outcome > or = 53 % to indicate reduced disability due to condition.  Goal status: New   4.  Patient will demonstrate right ankle & left knee LE MMT 5/5 throughout to faciltiate usual transfers, stairs, squatting at [facility] for daily life.   Goal status: New   5.  Patient ambulates >500' and neg ramps/curbs independently.  Goal status: New    PLAN:  PT FREQUENCY:  2x/week for 2 weeks, then 1x/wk for 10 weeks  PT DURATION: 12 weeks  PLANNED INTERVENTIONS: Therapeutic exercises, Therapeutic activity, Neuro Muscular re-education, Balance training, Gait training, Patient/Family education, Joint mobilization, Stair training, DME instructions, Dry Needling, Electrical stimulation, Traction, Cryotherapy, vasopneumatic deviceMoist heat, Taping, Ultrasound, Ionotophoresis 4mg /ml Dexamethasone, and aquatic therapy, Manual therapy.  All included unless contraindicated  PLAN FOR NEXT SESSION:   add knee exercises and stretches to HEP, check if got cane & bar stool and if helping left knee pain.  Continue with right ankle exercises (no standing without CAM boot).   Vladimir Faster, PT, DPT 05/06/2023, 12:50 PM

## 2023-05-09 ENCOUNTER — Encounter: Payer: Self-pay | Admitting: Rehabilitative and Restorative Service Providers"

## 2023-05-09 ENCOUNTER — Ambulatory Visit: Payer: Commercial Managed Care - PPO | Admitting: Rehabilitative and Restorative Service Providers"

## 2023-05-09 DIAGNOSIS — M25562 Pain in left knee: Secondary | ICD-10-CM | POA: Diagnosis not present

## 2023-05-09 DIAGNOSIS — R2681 Unsteadiness on feet: Secondary | ICD-10-CM

## 2023-05-09 DIAGNOSIS — R2689 Other abnormalities of gait and mobility: Secondary | ICD-10-CM | POA: Diagnosis not present

## 2023-05-09 DIAGNOSIS — G8929 Other chronic pain: Secondary | ICD-10-CM

## 2023-05-09 DIAGNOSIS — M25571 Pain in right ankle and joints of right foot: Secondary | ICD-10-CM

## 2023-05-09 DIAGNOSIS — M6281 Muscle weakness (generalized): Secondary | ICD-10-CM | POA: Diagnosis not present

## 2023-05-09 NOTE — Therapy (Signed)
OUTPATIENT PHYSICAL THERAPY LOWER EXTREMITY TREATMENT   Patient Name: Vickie Lane MRN: [id number] DOB:[birth year], 62 y.o., female Today's Date: 05/09/2023  END OF SESSION:  PT End of Session - 05/09/23 1343     Visit Number 4    Number of Visits 14    Date for PT Re-Evaluation 07/18/23    Authorization Type [place] AET[employer]    Authorization - Visit Number 4    Progress Note Due on Visit 14    PT Start Time 1342    PT Stop Time 1428    PT Time Calculation (min) 46 min    Activity Tolerance Patient tolerated treatment well;No increased pain    Behavior During Therapy [facility] for tasks assessed/performed               Past Medical History:  Diagnosis Date   Allergy 1990   Arthritis 2011   "back" (11/07/2012)   Breast cancer (HCC)    Chest pain    Chronic lower back pain    Constipation    GERD (gastroesophageal reflux disease) 1991   Heart murmur    "MVP" (`11/07/2012)   History of blood transfusion 1990's   "? w/one of my deliveries" (11/07/2012)   Hyperlipidemia 2019   LDL   Hypertension 1993   Joint pain 2011   osteo arthritis   Kidney problem 2008   Lactose intolerance    Mitral valve prolapse    Seizures (HCC) 1991   "related to pregnancy; I had HELLP" (11/07/2012)- lastseizure was 1991 per pt    Sickle cell trait (HCC)    Systemic lupus erythematosus (HCC) 1992   joint pain   Type II diabetes mellitus (HCC) 08/2011   Diet and exercise.   Past Surgical History:  Procedure Laterality Date   ABDOMINAL HYSTERECTOMY  1997   BREAST LUMPECTOMY WITH RADIOACTIVE SEED LOCALIZATION Left 09/18/2022   Procedure: LEFT BREAST LUMPECTOMY WITH RADIOACTIVE SEED LOCALIZATION;  Surgeon: Manus Rudd, MD;  Location: [facility];  Service: General;  Laterality: Left;   CARPAL TUNNEL RELEASE  2008   "right w/fracture OR" (11/07/2012)   CESAREAN SECTION  1991, 1993   COLONOSCOPY     POLYPECTOMY     POSTERIOR LUMBAR FUSION  11/07/2012    "L4-5" (11/07/2012)   TRIGGER FINGER RELEASE Right 05/04/2016   Procedure: MINOR RELEASE TRIGGER FINGER/A-1 PULLEY;  Surgeon: Dairl Ponder, MD;  Location: [facility];  Service: Orthopedics;  Laterality: Right;   WRIST FRACTURE SURGERY  2008   "right" (11/07/2012)   Patient Active Problem List   Diagnosis Date Noted   Genetic testing 09/11/2022   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   Systemic lupus erythematosus (HCC) 03/13/2022   Pure hypercholesterolemia 03/13/2022   Menopause 03/13/2022   Localized, primary osteoarthritis of hand 03/13/2022   Gastro-esophageal reflux disease without esophagitis 03/13/2022   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   Coronary artery disease 03/13/2022   Chronic kidney disease, stage 2 (mild) 03/13/2022   Allergic rhinitis due to pollen 03/13/2022   Chronic kidney disease due to hypertension 03/13/2022   Sickle cell trait (HCC) 01/31/2022   Hypertension associated with type 2 diabetes mellitus (HCC) 11/06/2021   Diabetes mellitus (HCC) 11/06/2021   Class 1 obesity due to excess calories with body mass index (BMI) of 31.0 to 31.9 in adult 09/11/2021   Carcinoma in situ 09/11/2021   Hyperlipidemia associated with type 2 diabetes mellitus (HCC) 09/11/2021   Hypertension associated with diabetes (  HCC) 09/11/2021   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    PCP: Ardean Larsen, MD  REFERRING PROVIDER: Tarry Kos, MD  REFERRING DIAG:  92.301A (ICD-10-CM) - Multiple closed fractures of metatarsal bone of right foot, initial encounter  M94.262 (ICD-10-CM) - Chondromalacia of knee, left    THERAPY DIAG:  Pain in right ankle and joints of right foot  Chronic pain of left knee  Muscle weakness (generalized)  Other abnormalities of gait and mobility  Unsteadiness on feet  Rationale for Evaluation and Treatment: Rehabilitation  ONSET DATE: 02/15/2023 injury  SUBJECTIVE:   SUBJECTIVE  STATEMENT: [name] reports she is getting more comfortable with the cane.  HEP compliance is noted.  PERTINENT HISTORY: Breast CA with lumpectomy 09/18/2022, posterior Lumbar Fusion L4-5, Lupus, sickle cell trait, OA, DM2, renal disease, HTN,   PAIN:  NPRS scale: 0-2/10 this week Pain location: right foot instep top & bottom Pain description: dull ache Aggravating factors: putting weight on it without boot Relieving factors: get off it / rest  Left knee pain 2-10/10 this week Left knee anterior medial Sharp at times, others constant ache Aggravating: upon arising, walking longer times 10-15 min, stairs Relieving: stop walking, tylenol arthritis & night Aleve  PRECAUTIONS: None  WEIGHT BEARING RESTRICTIONS: Yes LLE WBAT in CAM boot 04/19/2023 for 6 weeks  FALLS:  Has patient fallen in last 6 months? Yes. Number of falls 1  LIVING ENVIRONMENT: Lives with: lives alone currently finance & his mother Lives in: House hers is tri-level and finance is tri-level Stairs: Yes: Internal: 6 mid to upper right rail & lowest to mid level left rail 12 steps; on right going up and on left going up and External: 3 steps; none Has following equipment at home: Crutches, shower chair, and kneel walker  OCCUPATION: nurse at [employer], sedentary job.  Moonlights on weekends on floor walk long distances >1/2 mile  PLOF: Independent  PATIENT GOALS: Return to work, cycling up to 20 miles, walk for exercise 5-6 miles 3x/wk, driving  Next MD visit:  02/25/8118  OBJECTIVE:  DIAGNOSTIC FINDINGS: 04/19/2023 X-ray showed Lisfranc complex stable but disuse osteopenia.  PATIENT SURVEYS:  FOTO intake:  43%  predicted:  53%  COGNITION: Overall cognitive status: WFL    SENSATION: WFL  EDEMA:  LLE: above knee 57.7 cm,  around knee 48 cm, above malleoli 21 cm, arch of foot 24.6 cm RLE: above knee 58cm,  around knee 47.8 cm, above malleoli 21 cm, arch of foot 23.2 cm  POSTURE: rounded shoulders, forward head,  and weight shift left  PALPATION: Right foot tenderness in arch.  Left knee tenderness medial joint line.   LOWER EXTREMITY ROM:   ROM Right eval Left eval Right  05/06/23  Hip flexion     Hip extension     Hip abduction     Hip adduction     Hip internal rotation     Hip external rotation     Knee flexion  Seated  A: WFL   Knee extension  A: 0*   Ankle dorsiflexion Knee extended A: -4* P: +4*  Knee extended A: 15*  Ankle plantarflexion WFL    Ankle inversion   Prone A: 16*  Ankle eversion   Prone A: 4*   (Blank rows = not tested)  LOWER EXTREMITY MMT:  MMT Right eval Left eval  Hip flexion    Hip extension    Hip abduction    Hip adduction    Hip internal  rotation    Hip external rotation    Knee flexion  4/5  Knee extension  4/5 Limited by pain  Ankle dorsiflexion 4/5   Ankle plantarflexion Seated Gross 3/5   Ankle inversion 4/5   Ankle eversion 4/5    (Blank rows = not tested)  FUNCTIONAL TESTS:  18 inch chair transfer: uses UEs to arise  GAIT: Distance walked: 100' Assistive device utilized:  CAM boot and None Level of assistance: Modified independence Comments:    TODAY'S TREATMENT                                                                          DATE: 05/09/2023: Seated heel to toe raises 30 x 3 seconds Quadriceps sets 2 sets of 10 for 5 seconds Side lie hip abduction (1/4 turn to stomach to activate hip abductors) 2 sets of 10  Seated straight leg raises 3 sets of 5 for 3 seconds Prone alternating hip extensions 3 sets 10 x 3 seconds    05/06/2023: Therapeutic Exercise: Seated gastroc with strap 3 reps x 20 seconds BLEs Seated heel raises BLEs 3 sec hold 15 reps Thera-band ankle PF with green Theraband RLE 15 reps Seated LLE hamstring curl green theraband 15 reps Ankle DF, inversion & eversion red theraband 15 reps LLE SAQ attempted 5# but eccentric pain. 10 reps no wt with some anterior medial knee pain. Prone knee flexion no  wt 15 reps Prone alternating hip extensions 10 x 3 seconds  Sidelying LLE hip adduction 10 reps Sidelying hip abduction 10 reps BLEs. Quad stretch supine hooklying strap knee flexion 20 sec 3 reps Hamstring stretch supine SLR strap 20 sec 2 reps Hip adduction & abduction stretch supine strap moving straight leg moving LLE across midline & laterally 20 sec 2 reps ea Pt reports active knee motion less painful after knee exercises including stretches.  Self-care: PT instructed in cane use contralateral to CAM boot.  Pt amb with cane safely reporting less knee pain. PT advised use of cane for gait outside home and if knee pain >5/10 in home. Pt verbalized understanding. PT demo & verbal cues on use of 24" bar stool including sit to/from stand for ADLs in kitchen & bathroom. PT advised use to decrease standing time with knee pain. Pt verbalized understanding.    05/02/2023: Seated upper heel cord stretch with strap 5 x 20 seconds Seated lower heel cord stretch with strap 5 x 20 seconds Thera-band ankle 4 way with green thera-band and slow eccentrics 15 x each direction Quadriceps sets 10 x 5 seconds Side lie hip abduction 10 x 3 seconds bilateral Prone alternating hip extensions 10 x 3 seconds    PATIENT EDUCATION:  Education details: HEP, POC, see gait above Person educated: Patient Education method: Programmer, multimedia, Demonstration, Verbal cues, and Handouts Education comprehension: verbalized understanding, returned demonstration, and verbal cues required  HOME EXERCISE PROGRAM: Access Code: [email or web address] URL: [email or web address] Date: 04/29/2023 Prepared by: Vladimir Faster  Exercises - Assisted ankle dorsiflexion with strap or towel  - 2-3 x daily - 7 x weekly - 1 sets - 3 reps - 20-30 seconds hold - Long Sitting Soleus Stretch on Bolster with Strap  - 2-3 x daily - 7 x  weekly - 1 sets - 3 reps - 20-30 seconds hold - Ankle and Toe Plantarflexion with green double (looped)  Resistance  - 2 x daily - 7 x weekly - 2-3 sets - 10 reps - 5 seconds hold - Ankle Dorsiflexion with red single Resistance  - 2 x daily - 7 x weekly - 2-3 sets - 10 reps - 5 seconds hold - Ankle Inversion with red single Resistance  - 2 x daily - 7 x weekly - 2-3 sets - 10 reps - 5 seconds hold - Ankle Eversion with red single Resistance  - 2 x daily - 7 x weekly - 2-3 sets - 10 reps - 5 seconds hold  ASSESSMENT: CLINICAL IMPRESSION: Kenicia reports good HEP compliance.  We focused on quadriceps and hip strengthening today given her excellent job with her foot/ankle HEP.  Select strengthening/AROM exercises were added to her HEP.  We did encourage as many seated heel/toe raises as possible to help with AROM and strength and prepare for eventual weight-bearing outside the boot.  Continue her current plan to address long-term goals.   OBJECTIVE IMPAIRMENTS: Abnormal gait, decreased activity tolerance, decreased balance, decreased endurance, decreased knowledge of use of DME, decreased mobility, decreased ROM, decreased strength, increased edema, and pain.   ACTIVITY LIMITATIONS: carrying, lifting, standing, stairs, and locomotion level  PARTICIPATION LIMITATIONS: driving, community activity, occupation, and recreation  PERSONAL FACTORS: 1-2 comorbidities: see PMH  are also affecting patient's functional outcome.   REHAB POTENTIAL: Good  CLINICAL DECISION MAKING: Stable/uncomplicated  EVALUATION COMPLEXITY: Low   GOALS: Goals reviewed with patient? Yes  SHORT TERM GOALS: (target date for Short term goals 06/03/2023)   1.  Patient will demonstrate independent use of home exercise program to maintain progress from in clinic treatments.  Goal status: Met 05/09/2023  2. PROM right ankle DF +15* Goal status: New  3.FOTO >50% Goal status: New  LONG TERM GOALS: (target dates for all long term goals  07/18/2023 )   1. Patient will demonstrate/report pain at worst less than or equal to 2/10  to facilitate minimal limitation in daily activity secondary to pain symptoms.  Goal status: On Going 05/09/2023   2. Patient will demonstrate independent use of home exercise program to facilitate ability to maintain/progress functional gains from skilled physical therapy services.  Goal status: New   3. Patient will demonstrate FOTO outcome > or = 53 % to indicate reduced disability due to condition.  Goal status: New   4.  Patient will demonstrate right ankle & left knee LE MMT 5/5 throughout to faciltiate usual transfers, stairs, squatting at [facility] for daily life.   Goal status: New   5.  Patient ambulates >500' and neg ramps/curbs independently.  Goal status: On Going 05/09/2023    PLAN:  PT FREQUENCY:  2x/week for 2 weeks, then 1x/wk for 10 weeks  PT DURATION: 12 weeks  PLANNED INTERVENTIONS: Therapeutic exercises, Therapeutic activity, Neuro Muscular re-education, Balance training, Gait training, Patient/Family education, Joint mobilization, Stair training, DME instructions, Dry Needling, Electrical stimulation, Traction, Cryotherapy, vasopneumatic deviceMoist heat, Taping, Ultrasound, Ionotophoresis 4mg /ml Dexamethasone, and aquatic therapy, Manual therapy.  All included unless contraindicated  PLAN FOR NEXT SESSION: Make sure the cane continues to get more comfortable.  Continue with right ankle and left knee exercises (no standing without CAM boot).   Cherlyn Cushing, PT, MPT 05/09/2023, 3:23 PM

## 2023-05-13 ENCOUNTER — Encounter: Payer: Self-pay | Admitting: Physical Therapy

## 2023-05-13 ENCOUNTER — Ambulatory Visit: Payer: Commercial Managed Care - PPO | Admitting: Physical Therapy

## 2023-05-13 DIAGNOSIS — R2681 Unsteadiness on feet: Secondary | ICD-10-CM | POA: Diagnosis not present

## 2023-05-13 DIAGNOSIS — G8929 Other chronic pain: Secondary | ICD-10-CM

## 2023-05-13 DIAGNOSIS — M25562 Pain in left knee: Secondary | ICD-10-CM

## 2023-05-13 DIAGNOSIS — M6281 Muscle weakness (generalized): Secondary | ICD-10-CM

## 2023-05-13 DIAGNOSIS — M25571 Pain in right ankle and joints of right foot: Secondary | ICD-10-CM

## 2023-05-13 DIAGNOSIS — R2689 Other abnormalities of gait and mobility: Secondary | ICD-10-CM | POA: Diagnosis not present

## 2023-05-13 NOTE — Therapy (Signed)
OUTPATIENT PHYSICAL THERAPY LOWER EXTREMITY TREATMENT   Patient Name: Vickie Lane MRN: [id number] DOB:Aug 27, 1961, 62 y.o., female Today's Date: 05/13/2023  END OF SESSION:  PT End of Session - 05/13/23 1344     Visit Number 5    Number of Visits 14    Date for PT Re-Evaluation 07/18/23    Authorization Type [place] [employer]    Authorization - Visit Number 5    Progress Note Due on Visit 14    PT Start Time 1345    PT Stop Time 1430    PT Time Calculation (min) 45 min    Activity Tolerance Patient tolerated treatment well;No increased pain    Behavior During Therapy [facility] for tasks assessed/performed                Past Medical History:  Diagnosis Date   Allergy 1990   Arthritis 2011   "back" (11/07/2012)   Breast cancer (HCC)    Chest pain    Chronic lower back pain    Constipation    GERD (gastroesophageal reflux disease) 1991   Heart murmur    "MVP" (`11/07/2012)   History of blood transfusion 1990's   "? w/one of my deliveries" (11/07/2012)   Hyperlipidemia 2019   LDL   Hypertension 1993   Joint pain 2011   osteo arthritis   Kidney problem 2008   Lactose intolerance    Mitral valve prolapse    Seizures (HCC) 1991   "related to pregnancy; I had HELLP" (11/07/2012)- lastseizure was 1991 per pt    Sickle cell trait (HCC)    Systemic lupus erythematosus (HCC) 1992   joint pain   Type II diabetes mellitus (HCC) 08/2011   Diet and exercise.   Past Surgical History:  Procedure Laterality Date   ABDOMINAL HYSTERECTOMY  1997   BREAST LUMPECTOMY WITH RADIOACTIVE SEED LOCALIZATION Left 09/18/2022   Procedure: LEFT BREAST LUMPECTOMY WITH RADIOACTIVE SEED LOCALIZATION;  Surgeon: Manus Rudd, MD;  Location: [facility];  Service: General;  Laterality: Left;   CARPAL TUNNEL RELEASE  2008   "right w/fracture OR" (11/07/2012)   CESAREAN SECTION  1991, 1993   COLONOSCOPY     POLYPECTOMY     POSTERIOR LUMBAR FUSION  11/07/2012    "L4-5" (11/07/2012)   TRIGGER FINGER RELEASE Right 05/04/2016   Procedure: MINOR RELEASE TRIGGER FINGER/A-1 PULLEY;  Surgeon: Dairl Ponder, MD;  Location: [facility];  Service: Orthopedics;  Laterality: Right;   WRIST FRACTURE SURGERY  2008   "right" (11/07/2012)   Patient Active Problem List   Diagnosis Date Noted   Genetic testing 09/11/2022   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   Systemic lupus erythematosus (HCC) 03/13/2022   Pure hypercholesterolemia 03/13/2022   Menopause 03/13/2022   Localized, primary osteoarthritis of hand 03/13/2022   Gastro-esophageal reflux disease without esophagitis 03/13/2022   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   Coronary artery disease 03/13/2022   Chronic kidney disease, stage 2 (mild) 03/13/2022   Allergic rhinitis due to pollen 03/13/2022   Chronic kidney disease due to hypertension 03/13/2022   Sickle cell trait (HCC) 01/31/2022   Hypertension associated with type 2 diabetes mellitus (HCC) 11/06/2021   Diabetes mellitus (HCC) 11/06/2021   Class 1 obesity due to excess calories with body mass index (BMI) of 31.0 to 31.9 in adult 09/11/2021   Carcinoma in situ 09/11/2021   Hyperlipidemia associated with type 2 diabetes mellitus (HCC) 09/11/2021   Hypertension associated with  diabetes (HCC) 09/11/2021   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   PERTINENT HISTORY: Breast CA with lumpectomy 09/18/2022, posterior Lumbar Fusion L4-5, Lupus, sickle cell trait, OA, DM2, renal disease, HTN,   PAIN:  NPRS scale: 0-2/10 this week Pain location: right foot instep top & bottom Pain description: dull ache Aggravating factors: putting weight on it without boot Relieving factors: get off it / rest  Left knee pain 2-3/10 this week Left knee anterior medial Sharp at times, others constant ache Aggravating: upon arising, walking longer times 10-15 min, stairs Relieving: stop walking, tylenol arthritis & night Aleve  PRECAUTIONS: None  WEIGHT BEARING RESTRICTIONS: Yes LLE WBAT in CAM boot 04/19/2023 for 6 weeks  FALLS:  Has patient fallen in last 6 months? Yes. Number of falls 1  LIVING ENVIRONMENT: Lives with: lives alone currently finance & his mother Lives in: House hers is tri-level and finance is tri-level Stairs: Yes: Internal: 6 mid to upper right rail & lowest to mid level left rail 12 steps; on right going up and on left going up and External: 3 steps; none Has following equipment at home: Crutches, shower chair, and kneel walker  OCCUPATION: nurse at [employer], sedentary job.  Moonlights on weekends on floor walk long distances >1/2 mile  PLOF: Independent  PATIENT GOALS: Return to work, cycling up to 20 miles, walk for exercise 5-6 miles 3x/wk, driving  Next MD visit:  02/25/8118  OBJECTIVE:  DIAGNOSTIC FINDINGS: 04/19/2023 X-ray showed Lisfranc complex stable but disuse osteopenia.  PATIENT SURVEYS:  FOTO intake:  43%  predicted:  53%  COGNITION: Overall cognitive status: WFL    SENSATION: WFL  EDEMA:  LLE: above knee 57.7 cm,  around knee 48 cm, above malleoli 21 cm, arch of foot 24.6 cm RLE: above knee 58cm,  around knee 47.8 cm, above malleoli 21 cm, arch of foot 23.2 cm  POSTURE: rounded shoulders, forward head, and weight shift left  PALPATION: Right foot  tenderness in arch.  Left knee tenderness medial joint line.   LOWER EXTREMITY ROM:   ROM Right eval Left eval Right  05/06/23  Hip flexion     Hip extension     Hip abduction     Hip adduction     Hip internal rotation     Hip external rotation     Knee flexion  Seated  A: WFL   Knee extension  A: 0*   Ankle dorsiflexion Knee extended A: -4* P: +4*  Knee extended A: 15*  Ankle plantarflexion WFL    Ankle inversion   Prone A: 16*  Ankle eversion   Prone A: 4*   (Blank rows = not tested)  LOWER EXTREMITY MMT:  MMT Right eval Left eval  Hip flexion    Hip extension    Hip abduction    Hip adduction    Hip internal rotation  Hip external rotation    Knee flexion  4/5  Knee extension  4/5 Limited by pain  Ankle dorsiflexion 4/5   Ankle plantarflexion Seated Gross 3/5   Ankle inversion 4/5   Ankle eversion 4/5    (Blank rows = not tested)  FUNCTIONAL TESTS:  18 inch chair transfer: uses UEs to arise  GAIT: Distance walked: 100' Assistive device utilized:  CAM boot and None Level of assistance: Modified independence Comments:    TODAY'S TREATMENT                                                                          DATE: 05/13/2023: Therapeutic Exercise: Seated heel raises with right forefoot on towel roll to create greater range 30 reps x 3 sets Seated toe raises with right heel on towel roll to create greater range 30 reps x 3 sets Seated RLE PF with blue T-band 30 reps Seated RLE inversion & eversion with green T-band 30 reps each. Seated rolling tennis ball Rt foot to stretch plantar fascia 1 min ea prox/distal and med/lat. LLE Quadriceps sets 2 sets of 10 for 5 seconds LLE Seated straight leg raises 2 sets of 10 for 3 seconds LLE seated hamstring curl green T-band 10 reps 2 sets LLE Side lie hip abduction (1/4 turn to stomach to activate hip abductors) 30 reps LLE Prone alternating hip extensions 30 reps PT updated HEP see below  including HO,  demo & verbal cues and pt verbalized / return demo understanding.    05/09/2023: Seated heel to toe raises 30 x 3 seconds Quadriceps sets 2 sets of 10 for 5 seconds Side lie hip abduction (1/4 turn to stomach to activate hip abductors) 2 sets of 10  Seated straight leg raises 3 sets of 5 for 3 seconds Prone alternating hip extensions 3 sets 10 x 3 seconds    05/06/2023: Therapeutic Exercise: Seated gastroc with strap 3 reps x 20 seconds BLEs Seated heel raises BLEs 3 sec hold 15 reps Thera-band ankle PF with green Theraband RLE 15 reps Seated LLE hamstring curl green theraband 15 reps Ankle DF, inversion & eversion red theraband 15 reps LLE SAQ attempted 5# but eccentric pain. 10 reps no wt with some anterior medial knee pain. Prone knee flexion no wt 15 reps Prone alternating hip extensions 10 x 3 seconds  Sidelying LLE hip adduction 10 reps Sidelying hip abduction 10 reps BLEs. Quad stretch supine hooklying strap knee flexion 20 sec 3 reps Hamstring stretch supine SLR strap 20 sec 2 reps Hip adduction & abduction stretch supine strap moving straight leg moving LLE across midline & laterally 20 sec 2 reps ea Pt reports active knee motion less painful after knee exercises including stretches.  Self-care: PT instructed in cane use contralateral to CAM boot.  Pt amb with cane safely reporting less knee pain. PT advised use of cane for gait outside home and if knee pain >5/10 in home. Pt verbalized understanding. PT demo & verbal cues on use of 24" bar stool including sit to/from stand for ADLs in kitchen & bathroom. PT advised use to decrease standing time with knee pain. Pt verbalized understanding.    PATIENT EDUCATION:  Education details: HEP, POC, see gait  above Person educated: Patient Education method: Explanation, Demonstration, Verbal cues, and Handouts Education comprehension: verbalized understanding, returned demonstration, and verbal cues required  HOME EXERCISE  PROGRAM: Access Code: [email or web address] URL: [email or web address] Date: 05/13/2023 Prepared by: Vladimir Faster  Exercises - Assisted ankle dorsiflexion with strap or towel  - 2-3 x daily - 7 x weekly - 1 sets - 3 reps - 20-30 seconds hold - Long Sitting Soleus Stretch on Bolster with Strap  - 2-3 x daily - 7 x weekly - 1 sets - 3 reps - 20-30 seconds hold - Ankle and Toe Plantarflexion with Resistance  - 2 x daily - 7 x weekly - 2-3 sets - 10 reps - 5 seconds hold - Ankle Dorsiflexion with Resistance  - 2 x daily - 7 x weekly - 2-3 sets - 10 reps - 5 seconds hold - Ankle Inversion with Resistance  - 2 x daily - 7 x weekly - 2-3 sets - 10 reps - 5 seconds hold - Ankle Eversion with Resistance  - 2 x daily - 7 x weekly - 2-3 sets - 10 reps - 5 seconds hold - Seated Plantar Fascia Mobilization with Small Ball  - 1 x daily - 7 x weekly - 1 sets - 1 reps - 1 minute each direction hold - Supine Quadricep Sets  - 5 x daily - 7 x weekly - 2 sets - 10 reps - 5 second hold - Small Range Straight Leg Raise  - 2 x daily - 7 x weekly - 3-5 sets - 5 reps - 3 seconds hold - Seated Hamstring Curl with Anchored Resistance  - 1 x daily - 7 x weekly - 2-3 sets - 10 reps - 5 seconds hold - Sidelying Hip Abduction  - 1 x daily - 7 x weekly - 2-3 sets - 10 reps - 5 seconds hold - Prone Hip Extension  - 1 x daily - 7 x weekly - 2-3 sets - 10 reps - 3 seconds hold - Seated Table Hamstring Stretch  - 1-2 x daily - 7 x weekly - 1 sets - 3 reps - 20-30 seconds hold - Supine Quadriceps Stretch with Strap on Table  - 1-2 x daily - 7 x weekly - 1 sets - 3 reps - 20-30 seconds hold - Seated Heel Raise  - 1 x daily - 7 x weekly - 3 sets - 10 reps - 5 seconds hold - Seated Toe Raise  - 1 x daily - 7 x weekly - 3 sets - 10 reps - 5 seconds hold  ASSESSMENT: CLINICAL IMPRESSION: PT updated HEP to increase range that patient is working and amount of resistance which she tolerated well.  She reports less knee pain upon  arising compared to previously.  Continue her current plan to address long-term goals.   OBJECTIVE IMPAIRMENTS: Abnormal gait, decreased activity tolerance, decreased balance, decreased endurance, decreased knowledge of use of DME, decreased mobility, decreased ROM, decreased strength, increased edema, and pain.   ACTIVITY LIMITATIONS: carrying, lifting, standing, stairs, and locomotion level  PARTICIPATION LIMITATIONS: driving, community activity, occupation, and recreation  PERSONAL FACTORS: 1-2 comorbidities: see PMH  are also affecting patient's functional outcome.   REHAB POTENTIAL: Good  CLINICAL DECISION MAKING: Stable/uncomplicated  EVALUATION COMPLEXITY: Low   GOALS: Goals reviewed with patient? Yes  SHORT TERM GOALS: (target date for Short term goals 06/03/2023)   1.  Patient will demonstrate independent use of home exercise program to maintain progress from in clinic treatments.  Goal  status: Met 05/09/2023  2. PROM right ankle DF +15* Goal status: ongoing 05/13/2023  3.FOTO >50% Goal status: ongoing 05/13/2023  LONG TERM GOALS: (target dates for all long term goals  07/18/2023 )   1. Patient will demonstrate/report pain at worst less than or equal to 2/10 to facilitate minimal limitation in daily activity secondary to pain symptoms.  Goal status: ongoing 05/13/2023   2. Patient will demonstrate independent use of home exercise program to facilitate ability to maintain/progress functional gains from skilled physical therapy services.  Goal status: ongoing 05/13/2023   3. Patient will demonstrate FOTO outcome > or = 53 % to indicate reduced disability due to condition.  Goal status: ongoing 05/13/2023   4.  Patient will demonstrate right ankle & left knee LE MMT 5/5 throughout to faciltiate usual transfers, stairs, squatting at [facility] for daily life.   Goal status:    ongoing 05/13/2023   5.  Patient ambulates >500' and neg ramps/curbs independently.  Goal status:  ongoing 05/13/2023    PLAN:  PT FREQUENCY:  2x/week for 2 weeks, then 1x/wk for 10 weeks  PT DURATION: 12 weeks  PLANNED INTERVENTIONS: Therapeutic exercises, Therapeutic activity, Neuro Muscular re-education, Balance training, Gait training, Patient/Family education, Joint mobilization, Stair training, DME instructions, Dry Needling, Electrical stimulation, Traction, Cryotherapy, vasopneumatic deviceMoist heat, Taping, Ultrasound, Ionotophoresis 4mg /ml Dexamethasone, and aquatic therapy, Manual therapy.  All included unless contraindicated  PLAN FOR NEXT SESSION:  Continue with right ankle and left knee exercises (no standing without CAM boot).    Vladimir Faster, PT, DPT 05/13/2023, 4:36 PM

## 2023-05-16 ENCOUNTER — Encounter: Payer: Self-pay | Admitting: Physical Therapy

## 2023-05-16 ENCOUNTER — Ambulatory Visit: Payer: Commercial Managed Care - PPO | Admitting: Physical Therapy

## 2023-05-16 DIAGNOSIS — R2681 Unsteadiness on feet: Secondary | ICD-10-CM

## 2023-05-16 DIAGNOSIS — G8929 Other chronic pain: Secondary | ICD-10-CM

## 2023-05-16 DIAGNOSIS — M25571 Pain in right ankle and joints of right foot: Secondary | ICD-10-CM | POA: Diagnosis not present

## 2023-05-16 DIAGNOSIS — M6281 Muscle weakness (generalized): Secondary | ICD-10-CM

## 2023-05-16 DIAGNOSIS — M25562 Pain in left knee: Secondary | ICD-10-CM | POA: Diagnosis not present

## 2023-05-16 DIAGNOSIS — R2689 Other abnormalities of gait and mobility: Secondary | ICD-10-CM | POA: Diagnosis not present

## 2023-05-16 NOTE — Therapy (Signed)
OUTPATIENT PHYSICAL THERAPY LOWER EXTREMITY TREATMENT   Patient Name: Vickie Lane MRN: [id number] DOB:1961-04-10, 62 y.o., female Today's Date: 05/16/2023  END OF SESSION:  PT End of Session - 05/16/23 1317     Visit Number 6    Number of Visits 14    Date for PT Re-Evaluation 07/18/23    Authorization Type [place] AETNA    Progress Note Due on Visit 14    PT Start Time 1312    PT Stop Time 1350    PT Time Calculation (min) 38 min    Activity Tolerance Patient tolerated treatment well;No increased pain    Behavior During Therapy [facility] for tasks assessed/performed                Past Medical History:  Diagnosis Date   Allergy 1990   Arthritis 2011   "back" (11/07/2012)   Breast cancer (HCC)    Chest pain    Chronic lower back pain    Constipation    GERD (gastroesophageal reflux disease) 1991   Heart murmur    "MVP" (`11/07/2012)   History of blood transfusion 1990's   "? w/one of my deliveries" (11/07/2012)   Hyperlipidemia 2019   LDL   Hypertension 1993   Joint pain 2011   osteo arthritis   Kidney problem 2008   Lactose intolerance    Mitral valve prolapse    Seizures (HCC) 1991   "related to pregnancy; I had HELLP" (11/07/2012)- lastseizure was 1991 per pt    Sickle cell trait (HCC)    Systemic lupus erythematosus (HCC) 1992   joint pain   Type II diabetes mellitus (HCC) 08/2011   Diet and exercise.   Past Surgical History:  Procedure Laterality Date   ABDOMINAL HYSTERECTOMY  1997   BREAST LUMPECTOMY WITH RADIOACTIVE SEED LOCALIZATION Left 09/18/2022   Procedure: LEFT BREAST LUMPECTOMY WITH RADIOACTIVE SEED LOCALIZATION;  Surgeon: Manus Rudd, MD;  Location: [facility];  Service: General;  Laterality: Left;   CARPAL TUNNEL RELEASE  2008   "right w/fracture OR" (11/07/2012)   CESAREAN SECTION  1991, 1993   COLONOSCOPY     POLYPECTOMY     POSTERIOR LUMBAR FUSION  11/07/2012   "L4-5" (11/07/2012)   TRIGGER FINGER  RELEASE Right 05/04/2016   Procedure: MINOR RELEASE TRIGGER FINGER/A-1 PULLEY;  Surgeon: Dairl Ponder, MD;  Location: [facility];  Service: Orthopedics;  Laterality: Right;   WRIST FRACTURE SURGERY  2008   "right" (11/07/2012)   Patient Active Problem List   Diagnosis Date Noted   Genetic testing 09/11/2022   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   Systemic lupus erythematosus (HCC) 03/13/2022   Pure hypercholesterolemia 03/13/2022   Menopause 03/13/2022   Localized, primary osteoarthritis of hand 03/13/2022   Gastro-esophageal reflux disease without esophagitis 03/13/2022   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   Coronary artery disease 03/13/2022   Chronic kidney disease, stage 2 (mild) 03/13/2022   Allergic rhinitis due to pollen 03/13/2022   Chronic kidney disease due to hypertension 03/13/2022   Sickle cell trait (HCC) 01/31/2022   Hypertension associated with type 2 diabetes mellitus (HCC) 11/06/2021   Diabetes mellitus (HCC) 11/06/2021   Class 1 obesity due to excess calories with body mass index (BMI) of 31.0 to 31.9 in adult 09/11/2021   Carcinoma in situ 09/11/2021   Hyperlipidemia associated with type 2 diabetes mellitus (HCC) 09/11/2021   Hypertension associated with diabetes (HCC) 09/11/2021   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    PCP: Ardean Larsen, MD  REFERRING PROVIDER: Tarry Kos, MD  REFERRING DIAG:  92.301A (ICD-10-CM) - Multiple closed fractures of metatarsal bone of right foot, initial encounter  M94.262 (ICD-10-CM) - Chondromalacia of knee, left    THERAPY DIAG:  Pain in right ankle and joints of right foot  Chronic pain of left knee  Muscle weakness (generalized)  Other abnormalities of gait and mobility  Unsteadiness on feet  Rationale for Evaluation and Treatment: Rehabilitation  ONSET DATE: 02/15/2023 injury  SUBJECTIVE:   SUBJECTIVE STATEMENT: Her knees and back are hurting more which  may be related to weather. 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    PERTINENT HISTORY: Breast CA with lumpectomy 09/18/2022, posterior Lumbar Fusion L4-5, Lupus, sickle cell trait, OA, DM2, renal disease, HTN,   PAIN:  NPRS scale:   0-1/10 this week Pain location: right foot instep top & bottom Pain description: dull ache Aggravating factors: putting weight on it without boot Relieving factors: get off it / rest  Left knee pain standing & walking up 10/10 this week Left knee anterior medial Sharp at times, others constant ache Aggravating: upon arising, walking longer times 10-15 min, stairs Relieving: stop walking, tylenol arthritis & night Aleve  PRECAUTIONS: None  WEIGHT BEARING RESTRICTIONS: Yes LLE WBAT in CAM boot 04/19/2023 for 6 weeks  FALLS:  Has patient fallen in last 6 months? Yes. Number of falls 1  LIVING ENVIRONMENT: Lives with: lives alone currently finance & his mother Lives in: House hers is tri-level and finance is tri-level Stairs: Yes: Internal: 6 mid to upper right rail & lowest to mid level left rail 12 steps; on right going up and on left going up and External: 3 steps; none Has following equipment at home: Crutches, shower chair, and kneel walker  OCCUPATION: nurse at [employer], sedentary job.  Moonlights on weekends on floor walk long distances >1/2 mile  PLOF: Independent  PATIENT GOALS: Return to work, cycling up to 20 miles, walk for exercise 5-6 miles 3x/wk, driving  Next MD visit:  6/38/7564  OBJECTIVE:  DIAGNOSTIC FINDINGS: 04/19/2023 X-ray showed Lisfranc complex stable but disuse osteopenia.  PATIENT SURVEYS:  FOTO intake:  43%  predicted:  53%  COGNITION: Overall cognitive status: WFL    SENSATION: WFL  EDEMA:  LLE: above knee 57.7 cm,  around knee 48 cm, above malleoli 21 cm, arch of foot 24.6 cm RLE: above knee 58cm,  around knee 47.8 cm, above malleoli 21 cm, arch of foot 23.2 cm  POSTURE: rounded shoulders, forward head,  and weight shift left  PALPATION: Right foot tenderness in arch.  Left knee tenderness medial joint line.   LOWER EXTREMITY ROM:   ROM Right eval Left eval Right  05/06/23 Right 05/16/23  Hip flexion      Hip extension      Hip abduction      Hip adduction      Hip internal rotation      Hip external rotation      Knee flexion  Seated  A: WFL    Knee extension  A: 0*    Ankle dorsiflexion Knee extended A: -4* P: +4*  Knee extended A: 15* Knee extended A: +18* P: 22*  Ankle plantarflexion WFL     Ankle inversion   Prone A: 16*   Ankle eversion   Prone A: 4*    (Blank rows = not tested)  LOWER EXTREMITY MMT:  MMT Right eval Left eval  Hip flexion    Hip extension    Hip abduction    Hip adduction    Hip internal rotation    Hip external rotation    Knee flexion  4/5  Knee extension  4/5 Limited by pain  Ankle dorsiflexion 4/5   Ankle plantarflexion Seated Gross 3/5   Ankle inversion 4/5   Ankle eversion 4/5    (Blank rows = not tested)  FUNCTIONAL TESTS:  18 inch chair transfer: uses UEs to arise  GAIT: Distance walked: 100' Assistive device utilized:  CAM boot and None Level of assistance: Modified independence Comments:    TODAY'S TREATMENT                                                                          DATE: 05/16/2023: Therapeutic Exercise: Seated heel raises with right forefoot on towel roll to create greater range 30 reps x 3 sets Seated toe raises with right heel on towel roll to create greater range 30 reps x 3 sets Seated RLE PF with blue T-band 10 reps straight PF, 10 reps PF with inversion & 10 reps PF with eversion.  Seated RLE long sit green T-band 10 reps straight DF, 10 reps DF with inversion & 10 reps DF with eversion.  LLE standing TKE green T-band 10 reps 2 sets with 1/2" lift LLE due to CAM RLE LLE LAQ 5# 10 reps 2 sets LLE seated hamstring curl green T-band 10 reps 2 sets Sidelying clam shell hip abd green T-band 10  reps 2 sets Sidelying reverse clam shell 10 reps 2 sets LLE Side lying resisted green T-band behind knee with whole leg motion (pelvic depression / hip ext / knee ext) reaching downward towards corner of mat behind her 10 reps 2 sets. Pt reported LE fatigue. PT recommended for work on Sunday to use knee scooter for long walks between floors and CAM boot in/out of pt rooms or to/from nursing desk.  Consider using NSAID prior and appropriate time later to keep irritation down. Pt verbalized understanding.    05/13/2023: Therapeutic Exercise: Seated heel raises with right forefoot on towel roll to create greater range 30 reps x 3 sets Seated toe raises with right heel on towel roll to create greater range 30 reps x 3 sets Seated RLE PF with blue T-band 30 reps Seated RLE inversion & eversion with green T-band 30 reps each. Seated rolling tennis ball Rt foot to stretch plantar fascia 1 min ea prox/distal and med/lat. LLE Quadriceps sets 2 sets of 10 for 5 seconds LLE Seated straight leg raises 2 sets of 10 for 3 seconds LLE seated hamstring curl green T-band 10 reps 2 sets LLE Side lie hip abduction (1/4 turn to stomach to activate hip abductors) 30 reps LLE Prone alternating hip extensions 30 reps PT updated HEP see below  including HO, demo & verbal cues and pt verbalized / return demo understanding.    05/09/2023: Seated heel to toe raises 30 x 3 seconds Quadriceps sets 2 sets of 10 for 5 seconds Side lie hip abduction (1/4 turn to stomach to activate hip abductors) 2 sets of 10  Seated straight leg raises 3 sets of  5 for 3 seconds Prone alternating hip extensions 3 sets 10 x 3 seconds    HOME EXERCISE PROGRAM: Access Code: [email or web address] URL: [email or web address] Date: 05/13/2023 Prepared by: Vladimir Faster  Exercises - Assisted ankle dorsiflexion with strap or towel  - 2-3 x daily - 7 x weekly - 1 sets - 3 reps - 20-30 seconds hold - Long Sitting Soleus Stretch on Bolster  with Strap  - 2-3 x daily - 7 x weekly - 1 sets - 3 reps - 20-30 seconds hold - Ankle and Toe Plantarflexion with Resistance  - 2 x daily - 7 x weekly - 2-3 sets - 10 reps - 5 seconds hold - Ankle Dorsiflexion with Resistance  - 2 x daily - 7 x weekly - 2-3 sets - 10 reps - 5 seconds hold - Ankle Inversion with Resistance  - 2 x daily - 7 x weekly - 2-3 sets - 10 reps - 5 seconds hold - Ankle Eversion with Resistance  - 2 x daily - 7 x weekly - 2-3 sets - 10 reps - 5 seconds hold - Seated Plantar Fascia Mobilization with Small Ball  - 1 x daily - 7 x weekly - 1 sets - 1 reps - 1 minute each direction hold - Supine Quadricep Sets  - 5 x daily - 7 x weekly - 2 sets - 10 reps - 5 second hold - Small Range Straight Leg Raise  - 2 x daily - 7 x weekly - 3-5 sets - 5 reps - 3 seconds hold - Seated Hamstring Curl with Anchored Resistance  - 1 x daily - 7 x weekly - 2-3 sets - 10 reps - 5 seconds hold - Sidelying Hip Abduction  - 1 x daily - 7 x weekly - 2-3 sets - 10 reps - 5 seconds hold - Prone Hip Extension  - 1 x daily - 7 x weekly - 2-3 sets - 10 reps - 3 seconds hold - Seated Table Hamstring Stretch  - 1-2 x daily - 7 x weekly - 1 sets - 3 reps - 20-30 seconds hold - Supine Quadriceps Stretch with Strap on Table  - 1-2 x daily - 7 x weekly - 1 sets - 3 reps - 20-30 seconds hold - Seated Heel Raise  - 1 x daily - 7 x weekly - 3 sets - 10 reps - 5 seconds hold - Seated Toe Raise  - 1 x daily - 7 x weekly - 3 sets - 10 reps - 5 seconds hold  ASSESSMENT: CLINICAL IMPRESSION: PT progressed right ankle exercises to be multi plane and left knee functional resistance. She tolerated all changes in session without issues.  Continue her current plan to address long-term goals.   OBJECTIVE IMPAIRMENTS: Abnormal gait, decreased activity tolerance, decreased balance, decreased endurance, decreased knowledge of use of DME, decreased mobility, decreased ROM, decreased strength, increased edema, and pain.    ACTIVITY LIMITATIONS: carrying, lifting, standing, stairs, and locomotion level  PARTICIPATION LIMITATIONS: driving, community activity, occupation, and recreation  PERSONAL FACTORS: 1-2 comorbidities: see PMH  are also affecting patient's functional outcome.   REHAB POTENTIAL: Good  CLINICAL DECISION MAKING: Stable/uncomplicated  EVALUATION COMPLEXITY: Low   GOALS: Goals reviewed with patient? Yes  SHORT TERM GOALS: (target date for Short term goals 06/03/2023)   1.  Patient will demonstrate independent use of home exercise program to maintain progress from in clinic treatments.  Goal status: Met 05/09/2023  2. PROM right ankle DF +15* Goal status: ongoing  05/13/2023  3.FOTO >50% Goal status: ongoing 05/13/2023  LONG TERM GOALS: (target dates for all long term goals  07/18/2023 )   1. Patient will demonstrate/report pain at worst less than or equal to 2/10 to facilitate minimal limitation in daily activity secondary to pain symptoms.  Goal status: ongoing 05/13/2023   2. Patient will demonstrate independent use of home exercise program to facilitate ability to maintain/progress functional gains from skilled physical therapy services.  Goal status: ongoing 05/13/2023   3. Patient will demonstrate FOTO outcome > or = 53 % to indicate reduced disability due to condition.  Goal status: ongoing 05/13/2023   4.  Patient will demonstrate right ankle & left knee LE MMT 5/5 throughout to faciltiate usual transfers, stairs, squatting at [facility] for daily life.   Goal status:    ongoing 05/13/2023   5.  Patient ambulates >500' and neg ramps/curbs independently.  Goal status: ongoing 05/13/2023    PLAN:  PT FREQUENCY:  2x/week for 2 weeks, then 1x/wk for 10 weeks  PT DURATION: 12 weeks  PLANNED INTERVENTIONS: Therapeutic exercises, Therapeutic activity, Neuro Muscular re-education, Balance training, Gait training, Patient/Family education, Joint mobilization, Stair training, DME  instructions, Dry Needling, Electrical stimulation, Traction, Cryotherapy, vasopneumatic deviceMoist heat, Taping, Ultrasound, Ionotophoresis 4mg /ml Dexamethasone, and aquatic therapy, Manual therapy.  All included unless contraindicated  PLAN FOR NEXT SESSION:  check response to progression of in-session exercises and add to HEP as appropriate.  Continue with right ankle and left knee exercises (no standing without CAM boot).    Vladimir Faster, PT, DPT 05/16/2023, 2:00 PM

## 2023-05-20 ENCOUNTER — Encounter: Payer: Self-pay | Admitting: Physical Therapy

## 2023-05-20 ENCOUNTER — Ambulatory Visit: Payer: Commercial Managed Care - PPO | Admitting: Physical Therapy

## 2023-05-20 ENCOUNTER — Other Ambulatory Visit (HOSPITAL_COMMUNITY): Payer: Self-pay

## 2023-05-20 DIAGNOSIS — M25562 Pain in left knee: Secondary | ICD-10-CM

## 2023-05-20 DIAGNOSIS — R2681 Unsteadiness on feet: Secondary | ICD-10-CM

## 2023-05-20 DIAGNOSIS — G8929 Other chronic pain: Secondary | ICD-10-CM

## 2023-05-20 DIAGNOSIS — M6281 Muscle weakness (generalized): Secondary | ICD-10-CM | POA: Diagnosis not present

## 2023-05-20 DIAGNOSIS — M25571 Pain in right ankle and joints of right foot: Secondary | ICD-10-CM

## 2023-05-20 DIAGNOSIS — R2689 Other abnormalities of gait and mobility: Secondary | ICD-10-CM | POA: Diagnosis not present

## 2023-05-20 NOTE — Therapy (Signed)
OUTPATIENT PHYSICAL THERAPY LOWER EXTREMITY TREATMENT   Patient Name: Jennife Razavi MRN: [id number] DOB:19-Nov-1961, 62 y.o., female Today's Date: 05/20/2023  END OF SESSION:  PT End of Session - 05/20/23 1142     Visit Number 7    Number of Visits 14    Date for PT Re-Evaluation 07/18/23    Authorization Type [place] AETNA    Progress Note Due on Visit 14    PT Start Time 1145    PT Stop Time 1231    PT Time Calculation (min) 46 min    Activity Tolerance Patient tolerated treatment well;No increased pain    Behavior During Therapy [facility] for tasks assessed/performed                 Past Medical History:  Diagnosis Date   Allergy 1990   Arthritis 2011   "back" (11/07/2012)   Breast cancer (HCC)    Chest pain    Chronic lower back pain    Constipation    GERD (gastroesophageal reflux disease) 1991   Heart murmur    "MVP" (`11/07/2012)   History of blood transfusion 1990's   "? w/one of my deliveries" (11/07/2012)   Hyperlipidemia 2019   LDL   Hypertension 1993   Joint pain 2011   osteo arthritis   Kidney problem 2008   Lactose intolerance    Mitral valve prolapse    Seizures (HCC) 1991   "related to pregnancy; I had HELLP" (11/07/2012)- lastseizure was 1991 per pt    Sickle cell trait (HCC)    Systemic lupus erythematosus (HCC) 1992   joint pain   Type II diabetes mellitus (HCC) 08/2011   Diet and exercise.   Past Surgical History:  Procedure Laterality Date   ABDOMINAL HYSTERECTOMY  1997   BREAST LUMPECTOMY WITH RADIOACTIVE SEED LOCALIZATION Left 09/18/2022   Procedure: LEFT BREAST LUMPECTOMY WITH RADIOACTIVE SEED LOCALIZATION;  Surgeon: Manus Rudd, MD;  Location: [facility];  Service: General;  Laterality: Left;   CARPAL TUNNEL RELEASE  2008   "right w/fracture OR" (11/07/2012)   CESAREAN SECTION  1991, 1993   COLONOSCOPY     POLYPECTOMY     POSTERIOR LUMBAR FUSION  11/07/2012   "L4-5" (11/07/2012)   TRIGGER  FINGER RELEASE Right 05/04/2016   Procedure: MINOR RELEASE TRIGGER FINGER/A-1 PULLEY;  Surgeon: Dairl Ponder, MD;  Location: [facility];  Service: Orthopedics;  Laterality: Right;   WRIST FRACTURE SURGERY  2008   "right" (11/07/2012)   Patient Active Problem List   Diagnosis Date Noted   Genetic testing 09/11/2022   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   Systemic lupus erythematosus (HCC) 03/13/2022   Pure hypercholesterolemia 03/13/2022   Menopause 03/13/2022   Localized, primary osteoarthritis of hand 03/13/2022   Gastro-esophageal reflux disease without esophagitis 03/13/2022   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   Coronary artery disease 03/13/2022   Chronic kidney disease, stage 2 (mild) 03/13/2022   Allergic rhinitis due to pollen 03/13/2022   Chronic kidney disease due to hypertension 03/13/2022   Sickle cell trait (HCC) 01/31/2022   Hypertension associated with type 2 diabetes mellitus (HCC) 11/06/2021   Diabetes mellitus (HCC) 11/06/2021   Class 1 obesity due to excess calories with body mass index (BMI) of 31.0 to 31.9 in adult 09/11/2021   Carcinoma in situ 09/11/2021   Hyperlipidemia associated with type 2 diabetes mellitus (HCC) 09/11/2021   Hypertension associated with diabetes (HCC) 09/11/2021   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 No issues.  PERTINENT HISTORY: Breast CA with lumpectomy 09/18/2022, posterior Lumbar Fusion L4-5, Lupus, sickle cell trait, OA, DM2, renal disease, HTN,   PAIN:  NPRS scale:  0/10 this week Pain location: right foot instep top & bottom Pain description: dull ache Aggravating factors: putting weight on it without boot Relieving factors: get off it / rest  Left knee pain standing & walking up 3/10 this week Left knee anterior medial Sharp at times, others constant ache Aggravating: upon arising, walking longer times 10-15 min, stairs Relieving: stop walking, tylenol arthritis & night Aleve  PRECAUTIONS: None  WEIGHT BEARING RESTRICTIONS: Yes LLE WBAT in CAM boot 04/19/2023 for 6 weeks  FALLS:  Has patient fallen in last 6 months? Yes. Number of falls 1  LIVING ENVIRONMENT: Lives with: lives alone currently finance & his mother Lives in: House hers is tri-level and finance is tri-level Stairs: Yes: Internal: 6 mid to upper right rail & lowest to mid level left rail 12 steps; on right going up and on left going up and External: 3 steps; none Has following equipment at home: Crutches, shower chair, and kneel walker  OCCUPATION: nurse at [employer], sedentary job.  Moonlights on weekends on floor walk long distances >1/2 mile  PLOF: Independent  PATIENT GOALS: Return to work, cycling up to 20 miles, walk for exercise 5-6 miles 3x/wk, driving  Next MD visit:  1/61/0960  OBJECTIVE:  DIAGNOSTIC FINDINGS: 04/19/2023 X-ray showed Lisfranc complex stable but disuse osteopenia.  PATIENT SURVEYS:  FOTO intake:  43%  predicted:  53%  COGNITION: Overall cognitive status: WFL    SENSATION: WFL  EDEMA:  LLE: above knee 57.7 cm,  around knee 48 cm, above malleoli 21 cm, arch of foot 24.6 cm RLE: above knee 58cm,  around knee 47.8 cm, above malleoli 21 cm, arch of  foot 23.2 cm  POSTURE: rounded shoulders, forward head, and weight shift left  PALPATION: Right foot tenderness in arch.  Left knee tenderness medial joint line.   LOWER EXTREMITY ROM:   ROM Right eval Left eval Right  05/06/23 Right 05/16/23  Hip flexion      Hip extension      Hip abduction      Hip adduction      Hip internal rotation      Hip external rotation      Knee flexion  Seated  A: WFL    Knee extension  A: 0*    Ankle dorsiflexion Knee extended A: -4* P: +4*  Knee extended A: 15* Knee extended A: +18* P: 22*  Ankle plantarflexion WFL     Ankle inversion   Prone A: 16*   Ankle eversion   Prone A: 4*    (Blank rows = not  tested)  LOWER EXTREMITY MMT:  MMT Right eval Left eval  Hip flexion    Hip extension    Hip abduction    Hip adduction    Hip internal rotation    Hip external rotation    Knee flexion  4/5  Knee extension  4/5 Limited by pain  Ankle dorsiflexion 4/5   Ankle plantarflexion Seated Gross 3/5   Ankle inversion 4/5   Ankle eversion 4/5    (Blank rows = not tested)  FUNCTIONAL TESTS:  18 inch chair transfer: uses UEs to arise  GAIT: Distance walked: 100' Assistive device utilized:  CAM boot and None Level of assistance: Modified independence Comments:    TODAY'S TREATMENT                                                                          DATE: 05/20/2023: Therapeutic Exercise: Seated heel raises with right forefoot on towel roll to create greater range 30 reps x 3 sets Seated toe raises with right heel on towel roll to create greater range 30 reps x 3 sets Seated RLE PF with blue T-band 10 reps straight PF, 10 reps PF with inversion & 10 reps PF with eversion.  Seated RLE long sit green T-band 10 reps straight DF, 10 reps DF with inversion & 10 reps DF with eversion.  Added above combo motion to HEP with HO. Pt verbalized and return demo understanding.  Precor bike level 1 8 min seat 7. PT educated on using 4" step  to assist with getting on/off her mounted street bike. Pt verbalized understanding including doing sets with timed rests to build tolerance. Pt verbalized understanding. Knee ext machine: PT demo & verbal cues on set up and keeping weight low initially until we can see her knee response. BLEs 15# 10 reps. Concentric BLEs and isometric / eccentric 5# 10 reps 2 sets. Knee flex machine: BLEs 25# 10 reps. LLE only 10# 10 reps 2 sets.   05/16/2023: Therapeutic Exercise: Seated heel raises with right forefoot on towel roll to create greater range 30 reps x 3 sets Seated toe raises with right heel on towel roll to create greater range 30 reps x 3 sets Seated RLE PF with blue T-band 10 reps straight PF, 10 reps PF with inversion & 10 reps PF with eversion.  Seated RLE long sit green T-band 10 reps straight DF, 10 reps DF with inversion & 10 reps DF with eversion.  LLE standing TKE green T-band 10 reps 2 sets with 1/2" lift LLE due to CAM RLE LLE LAQ 5# 10 reps 2 sets LLE seated hamstring curl green T-band 10 reps 2 sets Sidelying clam shell hip abd green T-band 10 reps 2 sets Sidelying reverse clam shell 10 reps 2 sets LLE Side lying resisted green T-band behind knee with whole leg motion (pelvic depression / hip ext / knee ext) reaching downward towards corner of mat behind her 10 reps 2 sets. Pt reported LE fatigue. PT recommended for work on Sunday to use knee scooter for long walks between floors and CAM boot in/out of pt rooms or to/from nursing desk.  Consider using NSAID prior and appropriate time later to keep irritation down. Pt verbalized understanding.  05/13/2023: Therapeutic Exercise: Seated heel raises with right forefoot on towel roll to create greater range 30 reps x 3 sets Seated toe raises with right heel on towel roll to create greater range 30 reps x 3 sets Seated RLE PF with blue T-band 30 reps Seated RLE inversion & eversion with green T-band 30 reps each. Seated rolling  tennis ball Rt foot to stretch plantar fascia 1 min ea prox/distal and med/lat. LLE Quadriceps sets 2 sets of 10 for 5 seconds LLE Seated straight leg raises 2 sets of 10 for 3 seconds LLE seated hamstring curl green T-band 10 reps 2 sets LLE Side lie hip abduction (1/4 turn to stomach to activate hip abductors) 30 reps LLE Prone alternating hip extensions 30 reps PT updated HEP see below  including HO, demo & verbal cues and pt verbalized / return demo understanding.    HOME EXERCISE PROGRAM: Access Code: [email or web address] URL: [email or web address] Date: 05/20/2023 Prepared by: Vladimir Faster  Exercises - Assisted ankle dorsiflexion with strap or towel  - 2-3 x daily - 7 x weekly - 1 sets - 3 reps - 20-30 seconds hold - Long Sitting Soleus Stretch on Bolster with Strap  - 2-3 x daily - 7 x weekly - 1 sets - 3 reps - 20-30 seconds hold - Ankle and Toe Plantarflexion with Resistance  - 2 x daily - 7 x weekly - 2-3 sets - 10 reps - 5 seconds hold - Long Sitting Ankle Plantarflexion with inversion & eversion  - 1 x daily - 5 x weekly - 2 sets - 10 reps - 5 seconds hold - Ankle Dorsiflexion with Resistance  - 2 x daily - 7 x weekly - 2-3 sets - 10 reps - 5 seconds hold - Long Sitting Ankle Dorsiflexion with inversion  - 1 x daily - 7 x weekly - 2 sets - 10 reps - 5 seconds hold - Long Sitting Ankle Dorsiflexion with eversion  - 1 x daily - 7 x weekly - 2 sets - 10 reps - 5 seconds hold - Seated Plantar Fascia Mobilization with Small Ball  - 1 x daily - 7 x weekly - 1 sets - 1 reps - 1 minute each direction hold - Supine Quadricep Sets  - 5 x daily - 7 x weekly - 2 sets - 10 reps - 5 second hold - Small Range Straight Leg Raise  - 2 x daily - 7 x weekly - 3-5 sets - 5 reps - 3 seconds hold - Seated Hamstring Curl with Anchored Resistance  - 1 x daily - 7 x weekly - 2-3 sets - 10 reps - 5 seconds hold - Sidelying Hip Abduction  - 1 x daily - 7 x weekly - 2-3 sets - 10 reps - 5 seconds  hold - Prone Hip Extension  - 1 x daily - 7 x weekly - 2-3 sets - 10 reps - 3 seconds hold - Seated Table Hamstring Stretch  - 1-2 x daily - 7 x weekly - 1 sets - 3 reps - 20-30 seconds hold - Supine Quadriceps Stretch with Strap on Table  - 1-2 x daily - 7 x weekly - 1 sets - 3 reps - 20-30 seconds hold - Seated Heel Raise  - 1 x daily - 7 x weekly - 3 sets - 10 reps - 5 seconds hold - Seated Toe Raise  - 1 x daily - 7 x weekly - 3 sets - 10 reps - 5 seconds hold  ASSESSMENT: CLINICAL IMPRESSION: PT changed ankle exercises to multidirectional for HEP which she appears to understand. PT instructed in cycling including how to mount / dismount which she appears to understand. Pt has [employer] investment [employer] and wants to get back. She appears to understand PT recommendations.   OBJECTIVE IMPAIRMENTS: Abnormal gait, decreased activity tolerance, decreased balance, decreased endurance, decreased knowledge of use of DME, decreased mobility, decreased ROM, decreased strength, increased edema, and pain.   ACTIVITY LIMITATIONS: carrying, lifting, standing, stairs, and locomotion level  PARTICIPATION LIMITATIONS: driving, community activity, occupation, and recreation  PERSONAL FACTORS: 1-2 comorbidities: see PMH  are also affecting patient's functional outcome.   REHAB POTENTIAL: Good  CLINICAL DECISION MAKING: Stable/uncomplicated  EVALUATION COMPLEXITY: Low   GOALS: Goals reviewed with patient? Yes  SHORT TERM GOALS: (target date for Short term goals 06/03/2023)   1.  Patient will demonstrate independent use of home exercise program to maintain progress from in clinic treatments.  Goal status: Met 05/09/2023  2. PROM right ankle DF +15* Goal status: ongoing 05/13/2023  3.FOTO >50% Goal status: ongoing 05/13/2023  LONG TERM GOALS: (target dates for all long term goals  07/18/2023 )   1. Patient will demonstrate/report pain at worst less than or equal to 2/10 to facilitate minimal  limitation in daily activity secondary to pain symptoms.  Goal status: ongoing 05/13/2023   2. Patient will demonstrate independent use of home exercise program to facilitate ability to maintain/progress functional gains from skilled physical therapy services.  Goal status: ongoing 05/13/2023   3. Patient will demonstrate FOTO outcome > or = 53 % to indicate reduced disability due to condition.  Goal status: ongoing 05/13/2023   4.  Patient will demonstrate right ankle & left knee LE MMT 5/5 throughout to faciltiate usual transfers, stairs, squatting at [facility] for daily life.   Goal status:    ongoing 05/13/2023   5.  Patient ambulates >500' and neg ramps/curbs independently.  Goal status: ongoing 05/13/2023    PLAN:  PT FREQUENCY:  2x/week for 2 weeks, then 1x/wk for 10 weeks  PT DURATION: 12 weeks  PLANNED INTERVENTIONS: Therapeutic exercises, Therapeutic activity, Neuro Muscular re-education, Balance training, Gait training, Patient/Family education, Joint mobilization, Stair training, DME instructions, Dry Needling, Electrical stimulation, Traction, Cryotherapy, vasopneumatic deviceMoist heat, Taping, Ultrasound, Ionotophoresis 4mg /ml Dexamethasone, and aquatic therapy, Manual therapy.  All included unless contraindicated  PLAN FOR NEXT SESSION: check on home cycling and weight machines at [employer]. Continue with right ankle and left knee exercises (no standing without CAM boot).    Vladimir Faster, PT, DPT 05/20/2023, 12:38 PM

## 2023-05-27 ENCOUNTER — Ambulatory Visit: Payer: Commercial Managed Care - PPO | Admitting: Physical Therapy

## 2023-05-27 ENCOUNTER — Encounter: Payer: Self-pay | Admitting: Physical Therapy

## 2023-05-27 DIAGNOSIS — M6281 Muscle weakness (generalized): Secondary | ICD-10-CM

## 2023-05-27 DIAGNOSIS — M25571 Pain in right ankle and joints of right foot: Secondary | ICD-10-CM

## 2023-05-27 DIAGNOSIS — R2681 Unsteadiness on feet: Secondary | ICD-10-CM

## 2023-05-27 DIAGNOSIS — R2689 Other abnormalities of gait and mobility: Secondary | ICD-10-CM

## 2023-05-27 DIAGNOSIS — M25562 Pain in left knee: Secondary | ICD-10-CM

## 2023-05-27 DIAGNOSIS — G8929 Other chronic pain: Secondary | ICD-10-CM

## 2023-05-27 NOTE — Therapy (Signed)
OUTPATIENT PHYSICAL THERAPY LOWER EXTREMITY TREATMENT   Patient Name: Vickie Lane MRN: [id number] DOB:02-13-1961, 62 y.o., female Today's Date: 05/27/2023  END OF SESSION:  PT End of Session - 05/27/23 1156     Visit Number 8    Number of Visits 14    Date for PT Re-Evaluation 07/18/23    Authorization Type Gretta Began    Authorization - Visit Number 6    Progress Note Due on Visit 14    PT Start Time 1145    PT Stop Time 1230    PT Time Calculation (min) 45 min    Activity Tolerance Patient tolerated treatment well;No increased pain    Behavior During Therapy [facility] for tasks assessed/performed                 Past Medical History:  Diagnosis Date   Allergy 1990   Arthritis 2011   "back" (11/07/2012)   Breast cancer (HCC)    Chest pain    Chronic lower back pain    Constipation    GERD (gastroesophageal reflux disease) 1991   Heart murmur    "MVP" (`11/07/2012)   History of blood transfusion 1990's   "? w/one of my deliveries" (11/07/2012)   Hyperlipidemia 2019   LDL   Hypertension 1993   Joint pain 2011   osteo arthritis   Kidney problem 2008   Lactose intolerance    Mitral valve prolapse    Seizures (HCC) 1991   "related to pregnancy; I had HELLP" (11/07/2012)- lastseizure was 1991 per pt    Sickle cell trait (HCC)    Systemic lupus erythematosus (HCC) 1992   joint pain   Type II diabetes mellitus (HCC) 08/2011   Diet and exercise.   Past Surgical History:  Procedure Laterality Date   ABDOMINAL HYSTERECTOMY  1997   BREAST LUMPECTOMY WITH RADIOACTIVE SEED LOCALIZATION Left 09/18/2022   Procedure: LEFT BREAST LUMPECTOMY WITH RADIOACTIVE SEED LOCALIZATION;  Surgeon: Manus Rudd, MD;  Location: [facility];  Service: General;  Laterality: Left;   CARPAL TUNNEL RELEASE  2008   "right w/fracture OR" (11/07/2012)   CESAREAN SECTION  1991, 1993   COLONOSCOPY     POLYPECTOMY     POSTERIOR LUMBAR FUSION  11/07/2012    "L4-5" (11/07/2012)   TRIGGER FINGER RELEASE Right 05/04/2016   Procedure: MINOR RELEASE TRIGGER FINGER/A-1 PULLEY;  Surgeon: Dairl Ponder, MD;  Location: [facility];  Service: Orthopedics;  Laterality: Right;   WRIST FRACTURE SURGERY  2008   "right" (11/07/2012)   Patient Active Problem List   Diagnosis Date Noted   Genetic testing 09/11/2022   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   Systemic lupus erythematosus (HCC) 03/13/2022   Pure hypercholesterolemia 03/13/2022   Menopause 03/13/2022   Localized, primary osteoarthritis of hand 03/13/2022   Gastro-esophageal reflux disease without esophagitis 03/13/2022   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   Coronary artery disease 03/13/2022   Chronic kidney disease, stage 2 (mild) 03/13/2022   Allergic rhinitis due to pollen 03/13/2022   Chronic kidney disease due to hypertension 03/13/2022   Sickle cell trait (HCC) 01/31/2022   Hypertension associated with type 2 diabetes mellitus (HCC) 11/06/2021   Diabetes mellitus (HCC) 11/06/2021   Class 1 obesity due to excess calories with body mass index (BMI) of 31.0 to 31.9 in adult 09/11/2021   Carcinoma in situ 09/11/2021   Hyperlipidemia associated with type 2 diabetes mellitus (HCC) 09/11/2021   Hypertension associated  with diabetes (HCC) 09/11/2021   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    PCP: Ardean Larsen, MD  REFERRING PROVIDER: Tarry Kos, MD  REFERRING DIAG:  92.301A (ICD-10-CM) - Multiple closed fractures of metatarsal bone of right foot, initial encounter  M94.262 (ICD-10-CM) - Chondromalacia of knee, left    THERAPY DIAG:  Pain in right ankle and joints of right foot  Chronic pain of left knee  Muscle weakness (generalized)  Unsteadiness on feet  Other abnormalities of gait and mobility  Rationale for Evaluation and Treatment: Rehabilitation  ONSET DATE: 02/15/2023 injury  SUBJECTIVE:   SUBJECTIVE STATEMENT: She  relays dull ache in her ankle and not much pain overall.   PERTINENT HISTORY: Breast CA with lumpectomy 09/18/2022, posterior Lumbar Fusion L4-5, Lupus, sickle cell trait, OA, DM2, renal disease, HTN,   PAIN:  NPRS scale:  0/10 this week Pain location: right foot instep top & bottom Pain description: dull ache Aggravating factors: putting weight on it without boot Relieving factors: get off it / rest  Left knee pain standing & walking up 3/10 this week Left knee anterior medial Sharp at times, others constant ache Aggravating: upon arising, walking longer times 10-15 min, stairs Relieving: stop walking, tylenol arthritis & night Aleve  PRECAUTIONS: None  WEIGHT BEARING RESTRICTIONS: Yes LLE WBAT in CAM boot 04/19/2023 for 6 weeks  FALLS:  Has patient fallen in last 6 months? Yes. Number of falls 1  LIVING ENVIRONMENT: Lives with: lives alone currently finance & his mother Lives in: House hers is tri-level and finance is tri-level Stairs: Yes: Internal: 6 mid to upper right rail & lowest to mid level left rail 12 steps; on right going up and on left going up and External: 3 steps; none Has following equipment at home: Crutches, shower chair, and kneel walker  OCCUPATION: nurse at [employer], sedentary job.  Moonlights on weekends on floor walk long distances >1/2 mile  PLOF: Independent  PATIENT GOALS: Return to work, cycling up to 20 miles, walk for exercise 5-6 miles 3x/wk, driving  Next MD visit:  08/02/7828  OBJECTIVE:  DIAGNOSTIC FINDINGS: 04/19/2023 X-ray showed Lisfranc complex stable but disuse osteopenia.  PATIENT SURVEYS:  FOTO intake:  43%  predicted:  53%  COGNITION: Overall cognitive status: WFL    SENSATION: WFL  EDEMA:  LLE: above knee 57.7 cm,  around knee 48 cm, above malleoli 21 cm, arch of foot 24.6 cm RLE: above knee 58cm,  around knee 47.8 cm, above malleoli 21 cm, arch of foot 23.2 cm  POSTURE: rounded shoulders, forward head, and weight shift  left  PALPATION: Right foot tenderness in arch.  Left knee tenderness medial joint line.   LOWER EXTREMITY ROM:   ROM Right eval Left eval Right  05/06/23 Right 05/16/23 Right 05/27/23 WFL observed during exercises today  Hip flexion       Hip extension       Hip abduction       Hip adduction       Hip internal rotation       Hip external rotation       Knee flexion  Seated  A: WFL     Knee extension  A: 0*     Ankle dorsiflexion Knee extended A: -4* P: +4*  Knee extended A: 15* Knee extended A: +18* P: 22* WFL  Ankle plantarflexion [facility]    WFL  Ankle inversion   Prone A: 16*  WFL  Ankle eversion   Prone A:  4*  WFL   (Blank rows = not tested)  LOWER EXTREMITY MMT:  MMT Right eval Left eval Right 05/27/23  Hip flexion     Hip extension     Hip abduction     Hip adduction     Hip internal rotation     Hip external rotation     Knee flexion  4/5 4+  Knee extension  4/5 Limited by pain 4+  Ankle dorsiflexion 4/5  4+  Ankle plantarflexion Seated Gross 3/5  Seated 4+  Ankle inversion 4/5  4+  Ankle eversion 4/5  4+   (Blank rows = not tested)  FUNCTIONAL TESTS:  18 inch chair transfer: uses UEs to arise  GAIT: Distance walked: 100' Assistive device utilized:  CAM boot and None Level of assistance: Modified independence Comments:    TODAY'S TREATMENT                                                                          DATE: 05/27/2023: Therapeutic Exercise: Precor bike L3-5 X 8 min seat #8 Seated gastroc stretch 30 sec X 3 with strap Seated soleus stretch 30 sec X 3 with strap BAPS L3 with 2# resistance for DF and PF X 20 each, INV/EV X 15 each, circles X 15 each CW and CCW (no weight for circles) Ankle 4 way with green X 15 each way Seated H.S curl machine 35# DL 1O10 then Rt leg only 96# X 10 Seated knee extension machine 15# DL 0A54 then 09# eccentrics for (up with both down with Rt only X10 and down with left only X 10   05/20/2023: Therapeutic  Exercise: Seated heel raises with right forefoot on towel roll to create greater range 30 reps x 3 sets Seated toe raises with right heel on towel roll to create greater range 30 reps x 3 sets Seated RLE PF with blue T-band 10 reps straight PF, 10 reps PF with inversion & 10 reps PF with eversion.  Seated RLE long sit green T-band 10 reps straight DF, 10 reps DF with inversion & 10 reps DF with eversion.  Added above combo motion to HEP with HO. Pt verbalized and return demo understanding.  Precor bike level 1 8 min seat 7. PT educated on using 4" step to assist with getting on/off her mounted street bike. Pt verbalized understanding including doing sets with timed rests to build tolerance. Pt verbalized understanding. Knee ext machine: PT demo & verbal cues on set up and keeping weight low initially until we can see her knee response. BLEs 15# 10 reps. Concentric BLEs and isometric / eccentric 5# 10 reps 2 sets. Knee flex machine: BLEs 25# 10 reps. LLE only 10# 10 reps 2 sets.   HOME EXERCISE PROGRAM: Access Code: [email or web address] URL: [email or web address] Date: 05/20/2023 Prepared by: Vladimir Faster  Exercises - Assisted ankle dorsiflexion with strap or towel  - 2-3 x daily - 7 x weekly - 1 sets - 3 reps - 20-30 seconds hold - Long Sitting Soleus Stretch on Bolster with Strap  - 2-3 x daily - 7 x weekly - 1 sets - 3 reps - 20-30 seconds hold - Ankle and Toe Plantarflexion with Resistance  - 2 x daily -  7 x weekly - 2-3 sets - 10 reps - 5 seconds hold - Long Sitting Ankle Plantarflexion with inversion & eversion  - 1 x daily - 5 x weekly - 2 sets - 10 reps - 5 seconds hold - Ankle Dorsiflexion with Resistance  - 2 x daily - 7 x weekly - 2-3 sets - 10 reps - 5 seconds hold - Long Sitting Ankle Dorsiflexion with inversion  - 1 x daily - 7 x weekly - 2 sets - 10 reps - 5 seconds hold - Long Sitting Ankle Dorsiflexion with eversion  - 1 x daily - 7 x weekly - 2 sets - 10 reps - 5 seconds  hold - Seated Plantar Fascia Mobilization with Small Ball  - 1 x daily - 7 x weekly - 1 sets - 1 reps - 1 minute each direction hold - Supine Quadricep Sets  - 5 x daily - 7 x weekly - 2 sets - 10 reps - 5 second hold - Small Range Straight Leg Raise  - 2 x daily - 7 x weekly - 3-5 sets - 5 reps - 3 seconds hold - Seated Hamstring Curl with Anchored Resistance  - 1 x daily - 7 x weekly - 2-3 sets - 10 reps - 5 seconds hold - Sidelying Hip Abduction  - 1 x daily - 7 x weekly - 2-3 sets - 10 reps - 5 seconds hold - Prone Hip Extension  - 1 x daily - 7 x weekly - 2-3 sets - 10 reps - 3 seconds hold - Seated Table Hamstring Stretch  - 1-2 x daily - 7 x weekly - 1 sets - 3 reps - 20-30 seconds hold - Supine Quadriceps Stretch with Strap on Table  - 1-2 x daily - 7 x weekly - 1 sets - 3 reps - 20-30 seconds hold - Seated Heel Raise  - 1 x daily - 7 x weekly - 3 sets - 10 reps - 5 seconds hold - Seated Toe Raise  - 1 x daily - 7 x weekly - 3 sets - 10 reps - 5 seconds hold  ASSESSMENT: CLINICAL IMPRESSION: She will be at 6 weeks post op at end of the week and will see MD again 06/04/23. She will likely be ready to transition out of boot if MD agrees and if so we will progress her HEP and standing activity out of the boot then as tolerated. We have kept her in boot for all weight bearing activity currently and she has done great with her ROM and strengthening program.   OBJECTIVE IMPAIRMENTS: Abnormal gait, decreased activity tolerance, decreased balance, decreased endurance, decreased knowledge of use of DME, decreased mobility, decreased ROM, decreased strength, increased edema, and pain.   ACTIVITY LIMITATIONS: carrying, lifting, standing, stairs, and locomotion level  PARTICIPATION LIMITATIONS: driving, community activity, occupation, and recreation  PERSONAL FACTORS: 1-2 comorbidities: see PMH  are also affecting patient's functional outcome.   REHAB POTENTIAL: Good  CLINICAL DECISION MAKING:  Stable/uncomplicated  EVALUATION COMPLEXITY: Low   GOALS: Goals reviewed with patient? Yes  SHORT TERM GOALS: (target date for Short term goals 06/03/2023)   1.  Patient will demonstrate independent use of home exercise program to maintain progress from in clinic treatments.  Goal status: Met 05/09/2023  2. PROM right ankle DF +15* Goal status: ongoing 05/13/2023  3.FOTO >50% Goal status: ongoing 05/13/2023  LONG TERM GOALS: (target dates for all long term goals  07/18/2023 )   1. Patient will  demonstrate/report pain at worst less than or equal to 2/10 to facilitate minimal limitation in daily activity secondary to pain symptoms.  Goal status: ongoing 05/13/2023   2. Patient will demonstrate independent use of home exercise program to facilitate ability to maintain/progress functional gains from skilled physical therapy services.  Goal status: ongoing 05/13/2023   3. Patient will demonstrate FOTO outcome > or = 53 % to indicate reduced disability due to condition.  Goal status: ongoing 05/13/2023   4.  Patient will demonstrate right ankle & left knee LE MMT 5/5 throughout to faciltiate usual transfers, stairs, squatting at [facility] for daily life.   Goal status:    ongoing 05/13/2023   5.  Patient ambulates >500' and neg ramps/curbs independently.  Goal status: ongoing 05/13/2023    PLAN:  PT FREQUENCY:  2x/week for 2 weeks, then 1x/wk for 10 weeks  PT DURATION: 12 weeks  PLANNED INTERVENTIONS: Therapeutic exercises, Therapeutic activity, Neuro Muscular re-education, Balance training, Gait training, Patient/Family education, Joint mobilization, Stair training, DME instructions, Dry Needling, Electrical stimulation, Traction, Cryotherapy, vasopneumatic deviceMoist heat, Taping, Ultrasound, Ionotophoresis 4mg /ml Dexamethasone, and aquatic therapy, Manual therapy.  All included unless contraindicated  PLAN FOR NEXT SESSION: what did MD say, can we begin standing activity without CAM  boot?  April Manson, PT, DPT 05/27/2023, 11:59 AM

## 2023-06-03 ENCOUNTER — Encounter: Payer: Commercial Managed Care - PPO | Admitting: Physical Therapy

## 2023-06-04 ENCOUNTER — Other Ambulatory Visit (INDEPENDENT_AMBULATORY_CARE_PROVIDER_SITE_OTHER): Payer: Commercial Managed Care - PPO

## 2023-06-04 ENCOUNTER — Ambulatory Visit: Payer: Commercial Managed Care - PPO | Admitting: Orthopaedic Surgery

## 2023-06-04 DIAGNOSIS — S92301A Fracture of unspecified metatarsal bone(s), right foot, initial encounter for closed fracture: Secondary | ICD-10-CM | POA: Diagnosis not present

## 2023-06-04 DIAGNOSIS — M1712 Unilateral primary osteoarthritis, left knee: Secondary | ICD-10-CM | POA: Diagnosis not present

## 2023-06-04 NOTE — Progress Notes (Signed)
Office Visit Note   Patient: Vickie Lane           Date of Birth: December 10, 1960           MRN: [id number] Visit Date: 06/04/2023              Requested by: Ollen Bowl, MD [address] [place],  [place] [address] PCP: Ollen Bowl, MD   Assessment & Plan: Visit Diagnoses:  1. Multiple closed fractures of metatarsal bone of right foot, initial encounter   2. Primary osteoarthritis of left knee     Plan: In regards to the foot she is doing well she can discontinue the cam boot.  Continue to do physical therapy and increase activity as tolerated.  In regards to the left knee she has undergone extensive conservative treatments with injections and physical therapy.  She has an MRI from last year which shows isolated advanced bone-on-bone patellofemoral arthritis.  Based on these findings and the lack of improvement from conservative treatments she would like to move forward with a left patellofemoral joint arthroplasty however we will await to hear from her in terms of scheduling when she feels her right foot has recovered enough.  Follow-Up Instructions: No follow-ups on file.   Orders:  Orders Placed This Encounter  Procedures   XR Foot Complete Right   No orders of the defined types were placed in this encounter.     Procedures: No procedures performed   Clinical Data: No additional findings.   Subjective: Chief Complaint  Patient presents with   Left Knee - Follow-up   Right Foot - Pain    HPI [name] returns today for follow-up for right Lisfranc injury and left patellofemoral arthritis.  The foot is feeling much better from physical therapy.  Left knee has not felt much better from the Monovisc injection. Review of Systems   Objective: Vital Signs: There were no vitals taken for this visit.  Physical Exam  Ortho Exam Examination right foot shows no swelling or tenderness palpation. Left knee shows pain around the patella  and crepitus with range of motion. Specialty Comments:  No specialty comments available.  Imaging: XR Foot Complete Right  Result Date: 06/04/2023 X-rays demonstrate expected disuse osteopenia.  Healed Lisfranc fracture.    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   Coronary artery disease 03/13/2022   Chronic kidney disease, stage 2 (mild) 03/13/2022   Allergic rhinitis due to pollen 03/13/2022   Chronic kidney disease due to hypertension 03/13/2022   Sickle cell trait (HCC) 01/31/2022   Hypertension associated with type 2 diabetes mellitus (HCC) 11/06/2021   Diabetes mellitus (HCC) 11/06/2021   Class 1 obesity due to excess calories with body mass index (BMI) of 31.0 to 31.9 in adult 09/11/2021   Carcinoma in situ 09/11/2021   Hyperlipidemia associated with type 2 diabetes mellitus (HCC) 09/11/2021   Hypertension associated with diabetes (HCC) 09/11/2021   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 w/one of my deliveries" (11/07/2012)   Hyperlipidemia 2019   LDL   Hypertension 1993   Joint pain 2011   osteo arthritis   Kidney problem 2008   Lactose intolerance    Mitral valve prolapse    Seizures (HCC) 1991   "related to pregnancy; I had HELLP" (11/07/2012)- lastseizure was 1991 per  pt    Sickle cell trait (HCC)    Systemic lupus erythematosus (HCC) 1992   joint pain   Type II diabetes mellitus (HCC) 08/2011   Diet and exercise.    Family History  Problem Relation Age of Onset   Hypertension Mother    Hypertension Father    Heart attack Father    Sudden death Father    Cancer Paternal Aunt        GYN (uterine/cervical); d. mid 17s   Heart disease Other    Hypertension Other    Colon cancer Neg Hx    Colon polyps Neg Hx    Rectal cancer Neg Hx    Stomach cancer Neg Hx    Esophageal cancer Neg Hx     Past Surgical History:  Procedure Laterality Date   ABDOMINAL HYSTERECTOMY  1997   BREAST LUMPECTOMY WITH RADIOACTIVE SEED LOCALIZATION Left 09/18/2022   Procedure: LEFT BREAST LUMPECTOMY WITH RADIOACTIVE SEED LOCALIZATION;  Surgeon: Manus Rudd, MD;  Location: [facility];  Service: General;  Laterality: Left;   CARPAL TUNNEL RELEASE  2008   "right w/fracture OR" (11/07/2012)   CESAREAN SECTION  1991, 1993   COLONOSCOPY     POLYPECTOMY     POSTERIOR LUMBAR FUSION  11/07/2012   "L4-5" (11/07/2012)   TRIGGER FINGER RELEASE Right 05/04/2016   Procedure: MINOR RELEASE TRIGGER FINGER/A-1 PULLEY;  Surgeon: Dairl Ponder, MD;  Location: [facility];  Service: Orthopedics;  Laterality: Right;   WRIST FRACTURE SURGERY  2008   "right" (11/07/2012)   Social History   Occupational History   Occupation: Acupuncturist: [employer]  Tobacco Use   Smoking status: Never   Smokeless tobacco: Never  Vaping Use   Vaping status: Never Used  Substance and Sexual Activity   Alcohol use: Yes    Comment: 11/07/2012 "once/month might have a glass of wine"   Drug use: No   Sexual activity: Yes    Birth control/protection: Surgical

## 2023-06-06 ENCOUNTER — Ambulatory Visit: Payer: Commercial Managed Care - PPO | Admitting: Physical Therapy

## 2023-06-06 ENCOUNTER — Encounter: Payer: Self-pay | Admitting: Physical Therapy

## 2023-06-06 DIAGNOSIS — R2681 Unsteadiness on feet: Secondary | ICD-10-CM

## 2023-06-06 DIAGNOSIS — M25562 Pain in left knee: Secondary | ICD-10-CM | POA: Diagnosis not present

## 2023-06-06 DIAGNOSIS — M6281 Muscle weakness (generalized): Secondary | ICD-10-CM

## 2023-06-06 DIAGNOSIS — M25571 Pain in right ankle and joints of right foot: Secondary | ICD-10-CM

## 2023-06-06 DIAGNOSIS — G8929 Other chronic pain: Secondary | ICD-10-CM

## 2023-06-06 DIAGNOSIS — R2689 Other abnormalities of gait and mobility: Secondary | ICD-10-CM | POA: Diagnosis not present

## 2023-06-06 NOTE — Therapy (Signed)
OUTPATIENT PHYSICAL THERAPY LOWER EXTREMITY TREATMENT   Patient Name: Vickie Lane MRN: [id number] DOB:08-03-1961, 62 y.o., female Today's Date: 06/06/2023  END OF SESSION:  PT End of Session - 06/06/23 0804     Visit Number 9    Number of Visits 14    Date for PT Re-Evaluation 07/18/23    Authorization Type [place] AETNA    Progress Note Due on Visit 14    PT Start Time 0802    PT Stop Time 0844    PT Time Calculation (min) 42 min    Activity Tolerance Patient tolerated treatment well;No increased pain    Behavior During Therapy [facility] for tasks assessed/performed                  Past Medical History:  Diagnosis Date   Allergy 1990   Arthritis 2011   "back" (11/07/2012)   Breast cancer (HCC)    Chest pain    Chronic lower back pain    Constipation    GERD (gastroesophageal reflux disease) 1991   Heart murmur    "MVP" (`11/07/2012)   History of blood transfusion 1990's   "? w/one of my deliveries" (11/07/2012)   Hyperlipidemia 2019   LDL   Hypertension 1993   Joint pain 2011   osteo arthritis   Kidney problem 2008   Lactose intolerance    Mitral valve prolapse    Seizures (HCC) 1991   "related to pregnancy; I had HELLP" (11/07/2012)- lastseizure was 1991 per pt    Sickle cell trait (HCC)    Systemic lupus erythematosus (HCC) 1992   joint pain   Type II diabetes mellitus (HCC) 08/2011   Diet and exercise.   Past Surgical History:  Procedure Laterality Date   ABDOMINAL HYSTERECTOMY  1997   BREAST LUMPECTOMY WITH RADIOACTIVE SEED LOCALIZATION Left 09/18/2022   Procedure: LEFT BREAST LUMPECTOMY WITH RADIOACTIVE SEED LOCALIZATION;  Surgeon: Manus Rudd, MD;  Location: [facility];  Service: General;  Laterality: Left;   CARPAL TUNNEL RELEASE  2008   "right w/fracture OR" (11/07/2012)   CESAREAN SECTION  1991, 1993   COLONOSCOPY     POLYPECTOMY     POSTERIOR LUMBAR FUSION  11/07/2012   "L4-5" (11/07/2012)   TRIGGER  FINGER RELEASE Right 05/04/2016   Procedure: MINOR RELEASE TRIGGER FINGER/A-1 PULLEY;  Surgeon: Dairl Ponder, MD;  Location: [facility];  Service: Orthopedics;  Laterality: Right;   WRIST FRACTURE SURGERY  2008   "right" (11/07/2012)   Patient Active Problem List   Diagnosis Date Noted   Genetic testing 09/11/2022   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   Systemic lupus erythematosus (HCC) 03/13/2022   Pure hypercholesterolemia 03/13/2022   Menopause 03/13/2022   Localized, primary osteoarthritis of hand 03/13/2022   Gastro-esophageal reflux disease without esophagitis 03/13/2022   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   Coronary artery disease 03/13/2022   Chronic kidney disease, stage 2 (mild) 03/13/2022   Allergic rhinitis due to pollen 03/13/2022   Chronic kidney disease due to hypertension 03/13/2022   Sickle cell trait (HCC) 01/31/2022   Hypertension associated with type 2 diabetes mellitus (HCC) 11/06/2021   Diabetes mellitus (HCC) 11/06/2021   Class 1 obesity due to excess calories with body mass index (BMI) of 31.0 to 31.9 in adult 09/11/2021   Carcinoma in situ 09/11/2021   Hyperlipidemia associated with type 2 diabetes mellitus (HCC) 09/11/2021   Hypertension associated with diabetes (HCC) 09/11/2021   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 Roda Shutters on 06/04/2023 who  notes ankle healed enough to begin activities & exercise without CAM boot.  Her left knee has not improved and will probably need a patello-femoral arthroplasty when her right ankle has recovered enough. She has been walking without CAM boot for last 2 days and feels like lump under foot along outside of foot.    PERTINENT HISTORY: Breast CA with lumpectomy 09/18/2022, posterior Lumbar Fusion L4-5, Lupus, sickle cell trait, OA, DM2, renal disease, HTN,   PAIN:  NPRS scale: 2/10 this week Pain location: right foot instep top & bottom Pain description: dull ache Aggravating factors: putting weight on it without boot Relieving factors: get off it / rest  Left knee pain standing & walking up 7/10 this week Left knee anterior medial Sharp at times, others constant ache Aggravating: upon arising, walking longer times 10-15 min, stairs Relieving: stop walking, tylenol arthritis & night Aleve  PRECAUTIONS: None  WEIGHT BEARING RESTRICTIONS: Yes LLE WBAT in CAM boot 04/19/2023 for 6 weeks  FALLS:  Has patient fallen in last 6 months? Yes. Number of falls 1  LIVING ENVIRONMENT: Lives with: lives alone currently finance & his mother Lives in: House hers is tri-level and finance is tri-level Stairs: Yes: Internal: 6 mid to upper right rail & lowest to mid level left rail 12 steps; on right going up and on left going up and External: 3 steps; none Has following equipment at home: Crutches, shower chair, and kneel walker  OCCUPATION: nurse at [employer], sedentary job.  Moonlights on weekends on floor walk long distances >1/2 mile  PLOF: Independent  PATIENT GOALS: Return to work, cycling up to 20 miles, walk for exercise 5-6 miles 3x/wk, driving  Next MD visit:  1/61/0960  OBJECTIVE:  DIAGNOSTIC FINDINGS: 04/19/2023 X-ray showed Lisfranc complex stable but disuse osteopenia.  PATIENT SURVEYS:  06/06/2023:  FOTO visit 9 49%  04/29/2023:  FOTO intake:  43%  predicted:  53%  COGNITION: Overall  cognitive status: WFL    SENSATION: WFL  EDEMA:  LLE: above knee 57.7 cm,  around knee 48 cm, above malleoli 21 cm, arch of foot 24.6 cm RLE: above knee 58cm,  around knee 47.8 cm, above malleoli 21 cm, arch of foot 23.2 cm  POSTURE: rounded shoulders, forward head, and weight shift left  PALPATION: Right foot tenderness in arch.  Left knee tenderness medial joint line.   LOWER EXTREMITY ROM:   ROM Right eval Left eval Right  05/06/23 Right 05/16/23 Right 05/27/23 WFL observed during exercises today  Hip flexion       Hip extension       Hip abduction       Hip adduction       Hip internal rotation       Hip external rotation       Knee flexion  Seated  A: WFL     Knee extension  A: 0*     Ankle dorsiflexion Knee extended A: -4* P: +4*  Knee extended A: 15* Knee extended A: +18* P: 22* WFL  Ankle plantarflexion [facility]    WFL  Ankle inversion   Prone A: 16*  WFL  Ankle eversion   Prone A: 4*  WFL   (Blank rows = not tested)  LOWER EXTREMITY MMT:  MMT Right eval Left eval Right 05/27/23  Hip flexion     Hip extension     Hip abduction     Hip adduction     Hip internal rotation     Hip external rotation     Knee flexion  4/5 4+  Knee extension  4/5 Limited by pain 4+  Ankle dorsiflexion 4/5  4+  Ankle plantarflexion Seated Gross 3/5  Seated 4+  Ankle inversion 4/5  4+  Ankle eversion 4/5  4+   (Blank rows = not tested)  FUNCTIONAL TESTS:  18 inch chair transfer: uses UEs to arise  GAIT: Distance walked: 100' Assistive device utilized:  CAM boot and None Level of assistance: Modified independence Comments:    TODAY'S TREATMENT                                                                          DATE: 06/06/2023: Therapeutic Exercise: Precor bike L3-5 X 8 min seat #8 PT updated HEP to include the following with HO, demo & verbal cues. Pt verbalized & return demo understanding. Stretch to improve eversion: standing on folded towel under lateral  foot 30 sec weight bearing. 3 reps Standing on foam hip width sock-footed in corner with chair back in front: 10 reps ea eyes open & eyes closed head movements right/left, up/down & diagonals.  Standing on floor sock-footed tandem 30 sec ea with RLE in front & in back. Shoes on - heel raises and toe raises 20 reps. Walking on ramp / incline for 20 steps total up/down, side step to right and side step to left.     05/27/2023: Therapeutic Exercise: Precor bike L3-5 X 8 min seat #8 Seated gastroc stretch 30 sec X 3 with strap Seated soleus stretch 30 sec X 3 with strap BAPS L3 with 2# resistance for DF and PF X 20 each, INV/EV X 15 each, circles X 15 each CW and CCW (no weight for circles) Ankle 4 way with green X 15 each way Seated H.S curl machine 35# DL [id number] then Rt leg only 11# X 10 Seated knee extension machine 15# DL [id number] then 82# eccentrics for (up with both down with Rt only X10 and down with left only X 10   05/20/2023: Therapeutic Exercise: Seated heel raises with right forefoot on towel roll to create greater range 30 reps x 3 sets Seated toe raises with right heel on towel roll to create greater range 30 reps x 3 sets Seated RLE PF with blue T-band 10 reps straight PF, 10 reps PF with inversion & 10 reps PF with eversion.  Seated RLE long sit green T-band 10 reps straight DF, 10 reps DF with inversion & 10 reps DF with eversion.  Added above combo motion to HEP with HO. Pt verbalized and return demo understanding.  Precor  bike level 1 8 min seat 7. PT educated on using 4" step to assist with getting on/off her mounted street bike. Pt verbalized understanding including doing sets with timed rests to build tolerance. Pt verbalized understanding. Knee ext machine: PT demo & verbal cues on set up and keeping weight low initially until we can see her knee response. BLEs 15# 10 reps. Concentric BLEs and isometric / eccentric 5# 10 reps 2 sets. Knee flex machine: BLEs 25# 10 reps. LLE only  10# 10 reps 2 sets.   HOME EXERCISE PROGRAM: Access Code: [email or web address] URL: [email or web address] Date: 06/06/2023 Prepared by: Vladimir Faster  Exercises - Assisted ankle dorsiflexion with strap or towel  - 2-3 x daily - 7 x weekly - 1 sets - 3 reps - 20-30 seconds hold - Long Sitting Soleus Stretch on Bolster with Strap  - 2-3 x daily - 7 x weekly - 1 sets - 3 reps - 20-30 seconds hold - Ankle and Toe Plantarflexion with Resistance  - 2 x daily - 7 x weekly - 2-3 sets - 10 reps - 5 seconds hold - Long Sitting Ankle Plantarflexion with inversion & eversion  - 1 x daily - 5 x weekly - 2 sets - 10 reps - 5 seconds hold - Ankle Dorsiflexion with Resistance  - 2 x daily - 7 x weekly - 2-3 sets - 10 reps - 5 seconds hold - Long Sitting Ankle Dorsiflexion with inversion  - 1 x daily - 7 x weekly - 2 sets - 10 reps - 5 seconds hold - Long Sitting Ankle Dorsiflexion with eversion  - 1 x daily - 7 x weekly - 2 sets - 10 reps - 5 seconds hold - Seated Plantar Fascia Mobilization with Small Ball  - 1 x daily - 7 x weekly - 1 sets - 1 reps - 1 minute each direction hold - Supine Quadricep Sets  - 5 x daily - 7 x weekly - 2 sets - 10 reps - 5 second hold - Small Range Straight Leg Raise  - 2 x daily - 7 x weekly - 3-5 sets - 5 reps - 3 seconds hold - Seated Hamstring Curl with Anchored Resistance  - 1 x daily - 7 x weekly - 2-3 sets - 10 reps - 5 seconds hold - Sidelying Hip Abduction  - 1 x daily - 7 x weekly - 2-3 sets - 10 reps - 5 seconds hold - Prone Hip Extension  - 1 x daily - 7 x weekly - 2-3 sets - 10 reps - 3 seconds hold - Seated Table Hamstring Stretch  - 1-2 x daily - 7 x weekly - 1 sets - 3 reps - 20-30 seconds hold - Supine Quadriceps Stretch with Strap on Table  - 1-2 x daily - 7 x weekly - 1 sets - 3 reps - 20-30 seconds hold - Seated Heel Raise  - 1 x daily - 7 x weekly - 3 sets - 10 reps - 5 seconds hold - Seated Toe Raise  - 1 x daily - 7 x weekly - 3 sets - 10 reps - 5  seconds hold - standing on foam barefoot head movements eyes open & eyes closed  - 1 x daily - 7 x weekly - 1 sets - 10 reps - Tandem Stance barefoot - 1 x daily - 7 x weekly - 1 sets - 2 reps - 30 seconds hold - Standing Heel Raise  - 1 x daily - 7  x weekly - 2 sets - 10 reps - 5 seconds hold - Toe raises in Corner  - 1 x daily - 7 x weekly - 2 sets - 10 reps - 5 seconds hold Handwritten on HEP -inversion stretch standing on folded towel along lateral ankle 30 sec hold 3 reps 1-2 x/day -walking on incline up/down, side step to right up/down and side step to left up/down for 20 steps ea direction 1x/day   ASSESSMENT: CLINICAL IMPRESSION: PT advanced HEP to include standing exercises for ankle now that she can ambulate without CAM boot.  Pain in right foot appears to be tightness in invertors causing her to stand & walk on lateral foot.  Pt appears to understand HEP include stretch for invertors.   OBJECTIVE IMPAIRMENTS: Abnormal gait, decreased activity tolerance, decreased balance, decreased endurance, decreased knowledge of use of DME, decreased mobility, decreased ROM, decreased strength, increased edema, and pain.   ACTIVITY LIMITATIONS: carrying, lifting, standing, stairs, and locomotion level  PARTICIPATION LIMITATIONS: driving, community activity, occupation, and recreation  PERSONAL FACTORS: 1-2 comorbidities: see PMH  are also affecting patient's functional outcome.   REHAB POTENTIAL: Good  CLINICAL DECISION MAKING: Stable/uncomplicated  EVALUATION COMPLEXITY: Low   GOALS: Goals reviewed with patient? Yes  SHORT TERM GOALS: (target date for Short term goals 06/03/2023)   1.  Patient will demonstrate independent use of home exercise program to maintain progress from in clinic treatments.  Goal status: Met 05/09/2023  2. PROM right ankle DF +15* Goal status: MET 05/06/2023  3.FOTO >50% Goal status: partially MET 06/06/2023 improved to 49%  LONG TERM GOALS: (target dates  for all long term goals  07/18/2023 )   1. Patient will demonstrate/report pain at worst less than or equal to 2/10 to facilitate minimal limitation in daily activity secondary to pain symptoms.  Goal status: ongoing 06/06/2023   2. Patient will demonstrate independent use of home exercise program to facilitate ability to maintain/progress functional gains from skilled physical therapy services.  Goal status: ongoing 06/06/2023   3. Patient will demonstrate FOTO outcome > or = 53 % to indicate reduced disability due to condition.  Goal status: ongoing 06/06/2023   4.  Patient will demonstrate right ankle & left knee LE MMT 5/5 throughout to faciltiate usual transfers, stairs, squatting at [facility] for daily life.   Goal status:    ongoing 06/06/2023   5.  Patient ambulates >500' and neg ramps/curbs independently.  Goal status: ongoing 06/06/2023    PLAN:  PT FREQUENCY:  2x/week for 2 weeks, then 1x/wk for 10 weeks  PT DURATION: 12 weeks  PLANNED INTERVENTIONS: Therapeutic exercises, Therapeutic activity, Neuro Muscular re-education, Balance training, Gait training, Patient/Family education, Joint mobilization, Stair training, DME instructions, Dry Needling, Electrical stimulation, Traction, Cryotherapy, vasopneumatic deviceMoist heat, Taping, Ultrasound, Ionotophoresis 4mg /ml Dexamethasone, and aquatic therapy, Manual therapy.  All included unless contraindicated  PLAN FOR NEXT SESSION: check updated HEP & advance. assess TUG, gait velocity comfortable & fast.  Stair training.   Vladimir Faster, PT, DPT 06/06/2023, 8:56 AM

## 2023-06-10 ENCOUNTER — Other Ambulatory Visit (HOSPITAL_COMMUNITY): Payer: Self-pay

## 2023-06-10 DIAGNOSIS — M545 Low back pain, unspecified: Secondary | ICD-10-CM | POA: Diagnosis not present

## 2023-06-10 DIAGNOSIS — M7061 Trochanteric bursitis, right hip: Secondary | ICD-10-CM | POA: Diagnosis not present

## 2023-06-10 MED ORDER — METHYLPREDNISOLONE 4 MG PO TBPK
ORAL_TABLET | ORAL | 0 refills | Status: DC
  Filled 2023-06-10: qty 21, 6d supply, fill #0

## 2023-06-10 MED ORDER — CYCLOBENZAPRINE HCL 10 MG PO TABS
10.0000 mg | ORAL_TABLET | Freq: Three times a day (TID) | ORAL | 0 refills | Status: DC
  Filled 2023-06-10: qty 15, 5d supply, fill #0

## 2023-06-20 ENCOUNTER — Ambulatory Visit: Payer: Commercial Managed Care - PPO | Admitting: Physical Therapy

## 2023-06-20 ENCOUNTER — Encounter: Payer: Self-pay | Admitting: Physical Therapy

## 2023-06-20 DIAGNOSIS — R2681 Unsteadiness on feet: Secondary | ICD-10-CM

## 2023-06-20 DIAGNOSIS — M25562 Pain in left knee: Secondary | ICD-10-CM | POA: Diagnosis not present

## 2023-06-20 DIAGNOSIS — M25571 Pain in right ankle and joints of right foot: Secondary | ICD-10-CM | POA: Diagnosis not present

## 2023-06-20 DIAGNOSIS — R2689 Other abnormalities of gait and mobility: Secondary | ICD-10-CM

## 2023-06-20 DIAGNOSIS — G8929 Other chronic pain: Secondary | ICD-10-CM | POA: Diagnosis not present

## 2023-06-20 DIAGNOSIS — M6281 Muscle weakness (generalized): Secondary | ICD-10-CM | POA: Diagnosis not present

## 2023-06-20 NOTE — Therapy (Signed)
OUTPATIENT PHYSICAL THERAPY LOWER EXTREMITY TREATMENT   Patient Name: Vickie Lane MRN: [id number] DOB:1961/08/29, 62 y.o., female Today's Date: 06/20/2023  END OF SESSION:  PT End of Session - 06/20/23 0843     Visit Number 10    Number of Visits 14    Date for PT Re-Evaluation 07/18/23    Authorization Type [facility] AET[employer]    Progress Note Due on Visit 14    PT Start Time 0850    PT Stop Time 0928    PT Time Calculation (min) 38 min    Activity Tolerance Patient tolerated treatment well;No increased pain    Behavior During Therapy [facility] for tasks assessed/performed                  Past Medical History:  Diagnosis Date   Allergy 1990   Arthritis 2011   "back" (11/07/2012)   Breast cancer (HCC)    Chest pain    Chronic lower back pain    Constipation    GERD (gastroesophageal reflux disease) 1991   Heart murmur    "MVP" (`11/07/2012)   History of blood transfusion 1990's   "? w/one of my deliveries" (11/07/2012)   Hyperlipidemia 2019   LDL   Hypertension 1993   Joint pain 2011   osteo arthritis   Kidney problem 2008   Lactose intolerance    Mitral valve prolapse    Seizures (HCC) 1991   "related to pregnancy; I had HELLP" (11/07/2012)- lastseizure was 1991 per pt    Sickle cell trait (HCC)    Systemic lupus erythematosus (HCC) 1992   joint pain   Type II diabetes mellitus (HCC) 08/2011   Diet and exercise.   Past Surgical History:  Procedure Laterality Date   ABDOMINAL HYSTERECTOMY  1997   BREAST LUMPECTOMY WITH RADIOACTIVE SEED LOCALIZATION Left 09/18/2022   Procedure: LEFT BREAST LUMPECTOMY WITH RADIOACTIVE SEED LOCALIZATION;  Surgeon: Manus Rudd, MD;  Location: [facility];  Service: General;  Laterality: Left;   CARPAL TUNNEL RELEASE  2008   "right w/fracture OR" (11/07/2012)   CESAREAN SECTION  1991, 1993   COLONOSCOPY     POLYPECTOMY     POSTERIOR LUMBAR FUSION  11/07/2012   "L4-5" (11/07/2012)   TRIGGER  FINGER RELEASE Right 05/04/2016   Procedure: MINOR RELEASE TRIGGER FINGER/A-1 PULLEY;  Surgeon: Dairl Ponder, MD;  Location: [facility];  Service: Orthopedics;  Laterality: Right;   WRIST FRACTURE SURGERY  2008   "right" (11/07/2012)   Patient Active Problem List   Diagnosis Date Noted   Genetic testing 09/11/2022   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   Systemic lupus erythematosus (HCC) 03/13/2022   Pure hypercholesterolemia 03/13/2022   Menopause 03/13/2022   Localized, primary osteoarthritis of hand 03/13/2022   Gastro-esophageal reflux disease without esophagitis 03/13/2022   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   Coronary artery disease 03/13/2022   Chronic kidney disease, stage 2 (mild) 03/13/2022   Allergic rhinitis due to pollen 03/13/2022   Chronic kidney disease due to hypertension 03/13/2022   Sickle cell trait (HCC) 01/31/2022   Hypertension associated with type 2 diabetes mellitus (HCC) 11/06/2021   Diabetes mellitus (HCC) 11/06/2021   Class 1 obesity due to excess calories with body mass index (BMI) of 31.0 to 31.9 in adult 09/11/2021   Carcinoma in situ 09/11/2021   Hyperlipidemia associated with type 2 diabetes mellitus (HCC) 09/11/2021   Hypertension associated with diabetes (HCC) 09/11/2021   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    PERTINENT HISTORY: Breast CA with lumpectomy 09/18/2022, posterior Lumbar Fusion L4-5, Lupus, sickle cell trait, OA, DM2, renal disease, HTN,   PAIN:  NPRS scale: 2/10 this week Pain location: right foot instep top & bottom Pain description: dull ache Aggravating factors: putting weight on it without boot Relieving factors: get off it / rest  Left knee pain standing & walking up 7/10 this week Left knee anterior medial Sharp at times, others constant ache Aggravating: upon arising, walking longer times 10-15 min, stairs Relieving: stop walking, tylenol arthritis & night Aleve  PRECAUTIONS: None  WEIGHT BEARING RESTRICTIONS: Yes LLE WBAT in CAM boot 04/19/2023 for 6 weeks  FALLS:  Has patient fallen in last 6 months? Yes. Number of falls 1  LIVING ENVIRONMENT: Lives with: lives alone currently finance & his mother Lives in: House hers is tri-level and finance is tri-level Stairs: Yes: Internal: 6 mid to upper right rail & lowest to mid level left rail 12 steps; on right going up and on left going up and External: 3 steps; none Has following equipment at home: Crutches, shower chair, and kneel walker  OCCUPATION: nurse at [employer], sedentary job.  Moonlights on weekends on floor walk long distances >1/2 mile  PLOF: Independent  PATIENT GOALS: Return to work, cycling up to 20 miles, walk for exercise 5-6 miles 3x/wk, driving  Next MD visit:  7/82/9562  OBJECTIVE:  DIAGNOSTIC FINDINGS: 04/19/2023 X-ray showed Lisfranc complex stable but disuse osteopenia.  PATIENT SURVEYS:  06/06/2023:  FOTO visit 9 49%  04/29/2023:  FOTO intake:  43%  predicted:  53%  COGNITION: Overall cognitive status: WFL    SENSATION: WFL  EDEMA:  LLE: above knee 57.7 cm,  around knee 48 cm, above malleoli 21 cm, arch of foot 24.6 cm RLE: above knee 58cm,  around knee 47.8 cm, above malleoli 21 cm, arch of foot 23.2 cm  POSTURE: rounded shoulders, forward head, and weight shift  left  PALPATION: Right foot tenderness in arch.  Left knee tenderness medial joint line.   LOWER EXTREMITY ROM:   ROM Right eval Left eval Right  05/06/23 Right 05/16/23 Right 05/27/23 WFL observed during exercises today  Hip flexion       Hip extension       Hip abduction       Hip adduction       Hip internal rotation       Hip external rotation       Knee flexion  Seated  A: WFL     Knee extension  A: 0*     Ankle dorsiflexion Knee extended A: -4* P: +4*  Knee extended A: 15* Knee extended A: +18* P: 22* WFL  Ankle plantarflexion [facility]    WFL  Ankle inversion   Prone A: 16*  WFL  Ankle eversion   Prone A: 4*  WFL   (  Blank rows = not tested)  LOWER EXTREMITY MMT:  MMT Right eval Left eval Right 05/27/23  Hip flexion     Hip extension     Hip abduction     Hip adduction     Hip internal rotation     Hip external rotation     Knee flexion  4/5 4+  Knee extension  4/5 Limited by pain 4+  Ankle dorsiflexion 4/5  4+  Ankle plantarflexion Seated Gross 3/5  Seated 4+  Ankle inversion 4/5  4+  Ankle eversion 4/5  4+   (Blank rows = not tested)  FUNCTIONAL TESTS:  18 inch chair transfer: uses UEs to arise  GAIT: Distance walked: 100' Assistive device utilized:  CAM boot and None Level of assistance: Modified independence Comments:    TODAY'S TREATMENT                                                                          DATE: 06/20/2023: Therapeutic Exercise: Precor bike L5-7 X seat #8 Slantboard 30 sec X 2 gastroc, X 2 soleus Heel and toe raises 2X10 Seated ankle DF, INV, EV green 2X15 each  Neuromuscular re-ed  Tandem balance 30 sec X 2 bilat Sidestepping on foam beam 5 round trips Forward down and back backwards, 5 round trips on foam beam Rocker board 2 min A-P, 2 min lateral.  Stepping over hurdle and back 6 inch, X 15 Rt and X 15 Lt  06/06/2023: Therapeutic Exercise: Precor bike L3-5 X 8 min seat #8 PT updated HEP to include the  following with HO, demo & verbal cues. Pt verbalized & return demo understanding. Stretch to improve eversion: standing on folded towel under lateral foot 30 sec weight bearing. 3 reps Standing on foam hip width sock-footed in corner with chair back in front: 10 reps ea eyes open & eyes closed head movements right/left, up/down & diagonals.  Standing on floor sock-footed tandem 30 sec ea with RLE in front & in back. Shoes on - heel raises and toe raises 20 reps. Walking on ramp / incline for 20 steps total up/down, side step to right and side step to left.     05/27/2023: Therapeutic Exercise: Precor bike L3-5 X 8 min seat #8 Seated gastroc stretch 30 sec X 3 with strap Seated soleus stretch 30 sec X 3 with strap BAPS L3 with 2# resistance for DF and PF X 20 each, INV/EV X 15 each, circles X 15 each CW and CCW (no weight for circles) Ankle 4 way with green X 15 each way Seated H.S curl machine 35# DL 2X52 then Rt leg only 84# X 10 Seated knee extension machine 15# DL 1L24 then 40# eccentrics for (up with both down with Rt only X10 and down with left only X 10   05/20/2023: Therapeutic Exercise: Seated heel raises with right forefoot on towel roll to create greater range 30 reps x 3 sets Seated toe raises with right heel on towel roll to create greater range 30 reps x 3 sets Seated RLE PF with blue T-band 10 reps straight PF, 10 reps PF with inversion & 10 reps PF with eversion.  Seated RLE long sit green T-band 10 reps straight  DF, 10 reps DF with inversion & 10 reps DF with eversion.  Added above combo motion to HEP with HO. Pt verbalized and return demo understanding.  Precor bike level 1 8 min seat 7. PT educated on using 4" step to assist with getting on/off her mounted street bike. Pt verbalized understanding including doing sets with timed rests to build tolerance. Pt verbalized understanding. Knee ext machine: PT demo & verbal cues on set up and keeping weight low initially until we  can see her knee response. BLEs 15# 10 reps. Concentric BLEs and isometric / eccentric 5# 10 reps 2 sets. Knee flex machine: BLEs 25# 10 reps. LLE only 10# 10 reps 2 sets.   HOME EXERCISE PROGRAM: Access Code: [email or web address] URL: [email or web address] Date: 06/06/2023 Prepared by: Vladimir Faster  Exercises - Assisted ankle dorsiflexion with strap or towel  - 2-3 x daily - 7 x weekly - 1 sets - 3 reps - 20-30 seconds hold - Long Sitting Soleus Stretch on Bolster with Strap  - 2-3 x daily - 7 x weekly - 1 sets - 3 reps - 20-30 seconds hold - Ankle and Toe Plantarflexion with Resistance  - 2 x daily - 7 x weekly - 2-3 sets - 10 reps - 5 seconds hold - Long Sitting Ankle Plantarflexion with inversion & eversion  - 1 x daily - 5 x weekly - 2 sets - 10 reps - 5 seconds hold - Ankle Dorsiflexion with Resistance  - 2 x daily - 7 x weekly - 2-3 sets - 10 reps - 5 seconds hold - Long Sitting Ankle Dorsiflexion with inversion  - 1 x daily - 7 x weekly - 2 sets - 10 reps - 5 seconds hold - Long Sitting Ankle Dorsiflexion with eversion  - 1 x daily - 7 x weekly - 2 sets - 10 reps - 5 seconds hold - Seated Plantar Fascia Mobilization with Small Ball  - 1 x daily - 7 x weekly - 1 sets - 1 reps - 1 minute each direction hold - Supine Quadricep Sets  - 5 x daily - 7 x weekly - 2 sets - 10 reps - 5 second hold - Small Range Straight Leg Raise  - 2 x daily - 7 x weekly - 3-5 sets - 5 reps - 3 seconds hold - Seated Hamstring Curl with Anchored Resistance  - 1 x daily - 7 x weekly - 2-3 sets - 10 reps - 5 seconds hold - Sidelying Hip Abduction  - 1 x daily - 7 x weekly - 2-3 sets - 10 reps - 5 seconds hold - Prone Hip Extension  - 1 x daily - 7 x weekly - 2-3 sets - 10 reps - 3 seconds hold - Seated Table Hamstring Stretch  - 1-2 x daily - 7 x weekly - 1 sets - 3 reps - 20-30 seconds hold - Supine Quadriceps Stretch with Strap on Table  - 1-2 x daily - 7 x weekly - 1 sets - 3 reps - 20-30 seconds hold -  Seated Heel Raise  - 1 x daily - 7 x weekly - 3 sets - 10 reps - 5 seconds hold - Seated Toe Raise  - 1 x daily - 7 x weekly - 3 sets - 10 reps - 5 seconds hold - standing on foam barefoot head movements eyes open & eyes closed  - 1 x daily - 7 x weekly - 1 sets - 10 reps - Tandem Stance barefoot -  1 x daily - 7 x weekly - 1 sets - 2 reps - 30 seconds hold - Standing Heel Raise  - 1 x daily - 7 x weekly - 2 sets - 10 reps - 5 seconds hold - Toe raises in Corner  - 1 x daily - 7 x weekly - 2 sets - 10 reps - 5 seconds hold Handwritten on HEP -inversion stretch standing on folded towel along lateral ankle 30 sec hold 3 reps 1-2 x/day -walking on incline up/down, side step to right up/down and side step to left up/down for 20 steps ea direction 1x/day   ASSESSMENT: CLINICAL IMPRESSION: She is progressing with her standing tolerance and ambulation in regular shoe. I did progress her standing activity today with her PT exercises and added in more balance/neuromuscular activitiesd and she had good tolerance to this and no increased pain reported.    OBJECTIVE IMPAIRMENTS: Abnormal gait, decreased activity tolerance, decreased balance, decreased endurance, decreased knowledge of use of DME, decreased mobility, decreased ROM, decreased strength, increased edema, and pain.   ACTIVITY LIMITATIONS: carrying, lifting, standing, stairs, and locomotion level  PARTICIPATION LIMITATIONS: driving, community activity, occupation, and recreation  PERSONAL FACTORS: 1-2 comorbidities: see PMH  are also affecting patient's functional outcome.   REHAB POTENTIAL: Good  CLINICAL DECISION MAKING: Stable/uncomplicated  EVALUATION COMPLEXITY: Low   GOALS: Goals reviewed with patient? Yes  SHORT TERM GOALS: (target date for Short term goals 06/03/2023)   1.  Patient will demonstrate independent use of home exercise program to maintain progress from in clinic treatments.  Goal status: Met 05/09/2023  2. PROM  right ankle DF +15* Goal status: MET 05/06/2023  3.FOTO >50% Goal status: partially MET 06/06/2023 improved to 49%  LONG TERM GOALS: (target dates for all long term goals  07/18/2023 )   1. Patient will demonstrate/report pain at worst less than or equal to 2/10 to facilitate minimal limitation in daily activity secondary to pain symptoms.  Goal status: ongoing 06/06/2023   2. Patient will demonstrate independent use of home exercise program to facilitate ability to maintain/progress functional gains from skilled physical therapy services.  Goal status: ongoing 06/06/2023   3. Patient will demonstrate FOTO outcome > or = 53 % to indicate reduced disability due to condition.  Goal status: ongoing 06/06/2023   4.  Patient will demonstrate right ankle & left knee LE MMT 5/5 throughout to faciltiate usual transfers, stairs, squatting at [facility] for daily life.   Goal status:    ongoing 06/06/2023   5.  Patient ambulates >500' and neg ramps/curbs independently.  Goal status: ongoing 06/06/2023    PLAN:  PT FREQUENCY:  2x/week for 2 weeks, then 1x/wk for 10 weeks  PT DURATION: 12 weeks  PLANNED INTERVENTIONS: Therapeutic exercises, Therapeutic activity, Neuro Muscular re-education, Balance training, Gait training, Patient/Family education, Joint mobilization, Stair training, DME instructions, Dry Needling, Electrical stimulation, Traction, Cryotherapy, vasopneumatic deviceMoist heat, Taping, Ultrasound, Ionotophoresis 4mg /ml Dexamethasone, and aquatic therapy, Manual therapy.  All included unless contraindicated  PLAN FOR NEXT SESSION: check updated HEP & advance. assess TUG, gait velocity comfortable & fast.  Stair training.   April Manson, PT, DPT 06/20/2023, 9:46 AM

## 2023-06-29 ENCOUNTER — Other Ambulatory Visit (HOSPITAL_COMMUNITY): Payer: Self-pay

## 2023-07-04 ENCOUNTER — Encounter: Payer: Self-pay | Admitting: Physical Therapy

## 2023-07-04 ENCOUNTER — Ambulatory Visit: Payer: Commercial Managed Care - PPO | Admitting: Physical Therapy

## 2023-07-04 DIAGNOSIS — R2689 Other abnormalities of gait and mobility: Secondary | ICD-10-CM

## 2023-07-04 DIAGNOSIS — M25571 Pain in right ankle and joints of right foot: Secondary | ICD-10-CM | POA: Diagnosis not present

## 2023-07-04 DIAGNOSIS — R2681 Unsteadiness on feet: Secondary | ICD-10-CM | POA: Diagnosis not present

## 2023-07-04 DIAGNOSIS — M25562 Pain in left knee: Secondary | ICD-10-CM

## 2023-07-04 DIAGNOSIS — G8929 Other chronic pain: Secondary | ICD-10-CM

## 2023-07-04 DIAGNOSIS — M6281 Muscle weakness (generalized): Secondary | ICD-10-CM

## 2023-07-04 NOTE — Therapy (Addendum)
OUTPATIENT PHYSICAL THERAPY LOWER EXTREMITY TREATMENT  / DISCHARGE   Patient Name: Deriah Torrez MRN: [id number] DOB:July 06, 1961, 62 y.o., female Today's Date: 07/04/2023  END OF SESSION:  PT End of Session - 07/04/23 0806     Visit Number 11    Number of Visits 14    Date for PT Re-Evaluation 07/18/23    Authorization Type [place] [employer]ET[employer]    Progress Note Due on Visit 14    PT Start Time 0804    PT Stop Time 0844    PT Time Calculation (min) 40 min    Activity Tolerance Patient tolerated treatment well;No increased pain    Behavior During Therapy [facility] for tasks assessed/performed                   Past Medical History:  Diagnosis Date   Allergy 1990   Arthritis 2011   "back" (11/07/2012)   Breast cancer (HCC)    Chest pain    Chronic lower back pain    Constipation    GERD (gastroesophageal reflux disease) 1991   Heart murmur    "MVP" (`11/07/2012)   History of blood transfusion 1990's   "? w/one of my deliveries" (11/07/2012)   Hyperlipidemia 2019   LDL   Hypertension 1993   Joint pain 2011   osteo arthritis   Kidney problem 2008   Lactose intolerance    Mitral valve prolapse    Seizures (HCC) 1991   "related to pregnancy; I had HELLP" (11/07/2012)- lastseizure was 1991 per pt    Sickle cell trait (HCC)    Systemic lupus erythematosus (HCC) 1992   joint pain   Type II diabetes mellitus (HCC) 08/2011   Diet and exercise.   Past Surgical History:  Procedure Laterality Date   ABDOMINAL HYSTERECTOMY  1997   BREAST LUMPECTOMY WITH RADIOACTIVE SEED LOCALIZATION Left 09/18/2022   Procedure: LEFT BREAST LUMPECTOMY WITH RADIOACTIVE SEED LOCALIZATION;  Surgeon: Manus Rudd, MD;  Location: [facility];  Service: General;  Laterality: Left;   CARPAL TUNNEL RELEASE  2008   "right w/fracture OR" (11/07/2012)   CESAREAN SECTION  1991, 1993   COLONOSCOPY     POLYPECTOMY     POSTERIOR LUMBAR FUSION  11/07/2012   "L4-5"  (11/07/2012)   TRIGGER FINGER RELEASE Right 05/04/2016   Procedure: MINOR RELEASE TRIGGER FINGER/A-1 PULLEY;  Surgeon: Dairl Ponder, MD;  Location: [facility];  Service: Orthopedics;  Laterality: Right;   WRIST FRACTURE SURGERY  2008   "right" (11/07/2012)   Patient Active Problem List   Diagnosis Date Noted   Genetic testing 09/11/2022   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   Systemic lupus erythematosus (HCC) 03/13/2022   Pure hypercholesterolemia 03/13/2022   Menopause 03/13/2022   Localized, primary osteoarthritis of hand 03/13/2022   Gastro-esophageal reflux disease without esophagitis 03/13/2022   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   Coronary artery disease 03/13/2022   Chronic kidney disease, stage 2 (mild) 03/13/2022   Allergic rhinitis due to pollen 03/13/2022   Chronic kidney disease due to hypertension 03/13/2022   Sickle cell trait (HCC) 01/31/2022   Hypertension associated with type 2 diabetes mellitus (HCC) 11/06/2021   Diabetes mellitus (HCC) 11/06/2021   Class 1 obesity due to excess calories with body mass index (BMI) of 31.0 to 31.9 in adult 09/11/2021   Carcinoma in situ 09/11/2021   Hyperlipidemia associated with type 2 diabetes mellitus (HCC) 09/11/2021   Hypertension associated with diabetes (HCC)  09/11/2021   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  She has had 2 steroid shots which have helped. She does some leg lifts as exercise.  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    PERTINENT HISTORY: Breast CA with lumpectomy 09/18/2022, posterior Lumbar Fusion L4-5, Lupus, sickle cell trait, OA, DM2, renal disease, HTN,   PAIN:  NPRS scale: today 3/10 this week 3/10-7/10 Pain location: right foot instep top & bottom Pain description: dull ache Aggravating factors: putting weight on it without boot, walking & being on it all day (pain at night is worst) Relieving factors: get off it / rest  Left knee pain standing & walking up 2/10 this week Left knee anterior medial Sharp at times, others constant ache Aggravating: upon arising, walking longer times 10-15 min, stairs Relieving: stop walking, tylenol arthritis & night Aleve  PRECAUTIONS: None  WEIGHT BEARING RESTRICTIONS: Yes LLE WBAT in CAM boot 04/19/2023 for 6 weeks  FALLS:  Has patient fallen in last 6 months? Yes. Number of falls 1  LIVING ENVIRONMENT: Lives with: lives alone currently finance & his mother Lives in: House hers is tri-level and finance is tri-level Stairs: Yes: Internal: 6 mid to upper right rail & lowest to mid level left rail 12 steps; on right going up and on left going up and External: 3 steps; none Has following equipment at home: Crutches, shower chair, and kneel walker  OCCUPATION: nurse at [employer], sedentary job.  Moonlights on weekends on floor walk long distances >1/2 mile  PLOF: Independent  PATIENT GOALS: Return to work, cycling up to 20 miles, walk for exercise 5-6 miles 3x/wk, driving  Next MD visit:  9/52/8413  OBJECTIVE:  DIAGNOSTIC FINDINGS: 04/19/2023 X-ray showed Lisfranc complex stable but disuse osteopenia.  PATIENT SURVEYS:  06/06/2023:  FOTO visit 9 49%  04/29/2023:  FOTO intake:  43%  predicted:  53%  COGNITION: Overall cognitive status: WFL    SENSATION: WFL  EDEMA:  LLE: above knee 57.7 cm,   around knee 48 cm, above malleoli 21 cm, arch of foot 24.6 cm RLE: above knee 58cm,  around knee 47.8 cm, above malleoli 21 cm, arch of foot 23.2 cm  POSTURE: rounded shoulders, forward head, and weight shift left  PALPATION: Right foot tenderness in arch.  Left knee tenderness medial joint line.   LOWER EXTREMITY ROM:   ROM Right eval Left eval Right  05/06/23 Right 05/16/23 Right 05/27/23 WFL observed during exercises today  Hip flexion       Hip extension       Hip abduction       Hip adduction       Hip internal rotation       Hip external rotation       Knee flexion  Seated  A: WFL     Knee extension  A: 0*     Ankle  dorsiflexion Knee extended A: -4* P: +4*  Knee extended A: 15* Knee extended A: +18* P: 22* WFL  Ankle plantarflexion [facility]    WFL  Ankle inversion   Prone A: 16*  WFL  Ankle eversion   Prone A: 4*  WFL   (Blank rows = not tested)  LOWER EXTREMITY MMT:  MMT Right eval Left eval Right 05/27/23  Hip flexion     Hip extension     Hip abduction     Hip adduction     Hip internal rotation     Hip external rotation     Knee flexion  4/5 4+  Knee extension  4/5 Limited by pain 4+  Ankle dorsiflexion 4/5  4+  Ankle plantarflexion Seated Gross 3/5  Seated 4+  Ankle inversion 4/5  4+  Ankle eversion 4/5  4+   (Blank rows = not tested)  FUNCTIONAL TESTS:  18 inch chair transfer: uses UEs to arise  GAIT: Distance walked: 100' Assistive device utilized:  CAM boot and None Level of assistance: Modified independence Comments:    TODAY'S TREATMENT                                                                          DATE: 07/04/2023: Therapeutic Exercise: Precor bike L5-7 X seat #8 PT updated HEP to include stretches with email HO, verbal & demo - see HEP below. Pt verbalized & understanding.  Quadraped single UE & LE lifts 3 reps and bird pose 5 sec hold 10 reps.  Self-care: PT demo & verbal cues on getting in/out of bed with less back  strain. Pt return demo understanding and reports back felt better. PT demo & verbal cues on positioning in sidelying in bed. Pt verbalized understanding.    06/20/2023: Therapeutic Exercise: Precor bike L5-7 X seat #8 Slantboard 30 sec X 2 gastroc, X 2 soleus Heel and toe raises 2X10 Seated ankle DF, INV, EV green 2X15 each  Neuromuscular re-ed  Tandem balance 30 sec X 2 bilat Sidestepping on foam beam 5 round trips Forward down and back backwards, 5 round trips on foam beam Rocker board 2 min A-P, 2 min lateral.  Stepping over hurdle and back 6 inch, X 15 Rt and X 15 Lt  06/06/2023: Therapeutic Exercise: Precor bike L3-5 X 8 min seat #8 PT updated HEP to include the following with HO, demo & verbal cues. Pt verbalized & return demo understanding. Stretch to improve eversion: standing on folded towel under lateral foot 30 sec weight bearing. 3 reps Standing on foam hip width sock-footed in corner with chair back in front: 10 reps ea eyes open & eyes closed head movements right/left, up/down & diagonals.  Standing on floor sock-footed tandem 30 sec ea with RLE in front & in back. Shoes on - heel raises and toe raises 20 reps. Walking on ramp / incline for 20 steps total up/down, side step to right and side step to left.     HOME EXERCISE PROGRAM: Access Code: [email or web address] URL: [email or web address] Date: 07/04/2023 Prepared by: Vladimir Faster  Exercises - Assisted ankle dorsiflexion with strap or towel  - 2-3 x daily - 7 x weekly - 1 sets - 3 reps -  20-30 seconds hold - Long Sitting Soleus Stretch on Bolster with Strap  - 2-3 x daily - 7 x weekly - 1 sets - 3 reps - 20-30 seconds hold - Ankle and Toe Plantarflexion with Resistance  - 2 x daily - 7 x weekly - 2-3 sets - 10 reps - 5 seconds hold - Long Sitting Ankle Plantarflexion with inversion & eversion  - 1 x daily - 5 x weekly - 2 sets - 10 reps - 5 seconds hold - Ankle Dorsiflexion with Resistance  - 2 x daily  - 7 x weekly - 2-3 sets - 10 reps - 5 seconds hold - Long Sitting Ankle Dorsiflexion with inversion  - 1 x daily - 7 x weekly - 2 sets - 10 reps - 5 seconds hold - Long Sitting Ankle Dorsiflexion with eversion  - 1 x daily - 7 x weekly - 2 sets - 10 reps - 5 seconds hold - Seated Plantar Fascia Mobilization with Small Ball  - 1 x daily - 7 x weekly - 1 sets - 1 reps - 1 minute each direction hold - Supine Quadricep Sets  - 5 x daily - 7 x weekly - 2 sets - 10 reps - 5 second hold - Small Range Straight Leg Raise  - 2 x daily - 7 x weekly - 3-5 sets - 5 reps - 3 seconds hold - Seated Hamstring Curl with Anchored Resistance  - 1 x daily - 7 x weekly - 2-3 sets - 10 reps - 5 seconds hold - Sidelying Hip Abduction  - 1 x daily - 7 x weekly - 2-3 sets - 10 reps - 5 seconds hold - Prone Hip Extension  - 1 x daily - 7 x weekly - 2-3 sets - 10 reps - 3 seconds hold - Seated Table Hamstring Stretch  - 1-2 x daily - 7 x weekly - 1 sets - 3 reps - 20-30 seconds hold - Supine Quadriceps Stretch with Strap on Table  - 1-2 x daily - 7 x weekly - 1 sets - 3 reps - 20-30 seconds hold - Seated Heel Raise  - 1 x daily - 7 x weekly - 3 sets - 10 reps - 5 seconds hold - Seated Toe Raise  - 1 x daily - 7 x weekly - 3 sets - 10 reps - 5 seconds hold - standing on foam head movements eyes open & eyes closed  - 1 x daily - 7 x weekly - 1 sets - 10 reps - Tandem Stance  - 1 x daily - 7 x weekly - 1 sets - 2 reps - 30 seconds hold - Standing Heel Raise  - 1 x daily - 7 x weekly - 2 sets - 10 reps - 5 seconds hold - Toe raises in Corner  - 1 x daily - 7 x weekly - 2 sets - 10 reps - 5 seconds hold - calf stretch on wall  - 1-2 x daily - 7 x weekly - 1 sets - 2-3 reps - 20-30 seconds hold - Standing Soleus Stretch  - 1-2 x daily - 7 x weekly - 1 sets - 2-3 reps - 20-30 seconds hold - Gastroc Stretch on Step  - 1-2 x daily - 7 x weekly - 1 sets - 2-3 reps - 20-30 seconds hold - Standing Soleus Stretch on Step with Counter  Support  - 1-2 x daily - 7 x weekly - 1 sets - 2-3 reps -  20-30 seconds hold - Hooklying Single Knee to Chest Stretch  - 1-2 x daily - 7 x weekly - 1 sets - 2-3 reps - 20-30 seconds hold - Supine Double Knee to Chest  - 1-2 x daily - 7 x weekly - 1 sets - 2-3 reps - 20-30 seconds hold - Supine Lower Trunk Rotation  - 1-2 x daily - 7 x weekly - 1 sets - 2-3 reps - 20-30 seconds hold - Quadruped Alternating Arm Lift  - 1 x daily - 4-7 x weekly - 1 sets - 5-10 reps - 5 seconds hold - Quadruped Leg Lifts  - 1 x daily - 4-7 x weekly - 1 sets - 5-10 reps - 5 seconds hold - Bird Dog  - 1 x daily - 4-7 x weekly - 1 sets - 5-10 reps - 5 seconds hold   ASSESSMENT: CLINICAL IMPRESSION: PT updated HEP to include more flexibility which she appears to understand.  PT addressed some back exercises as back pain is limiting her progress with ankle / knee mobility.    OBJECTIVE IMPAIRMENTS: Abnormal gait, decreased activity tolerance, decreased balance, decreased endurance, decreased knowledge of use of DME, decreased mobility, decreased ROM, decreased strength, increased edema, and pain.   ACTIVITY LIMITATIONS: carrying, lifting, standing, stairs, and locomotion level  PARTICIPATION LIMITATIONS: driving, community activity, occupation, and recreation  PERSONAL FACTORS: 1-2 comorbidities: see PMH  are also affecting patient's functional outcome.   REHAB POTENTIAL: Good  CLINICAL DECISION MAKING: Stable/uncomplicated  EVALUATION COMPLEXITY: Low   GOALS: Goals reviewed with patient? Yes  SHORT TERM GOALS: (target date for Short term goals 06/03/2023)   1.  Patient will demonstrate independent use of home exercise program to maintain progress from in clinic treatments.  Goal status: Met 05/09/2023  2. PROM right ankle DF +15* Goal status: MET 05/06/2023  3.FOTO >50% Goal status: partially MET 06/06/2023 improved to 49%  LONG TERM GOALS: (target dates for all long term goals  07/18/2023 )   1.  Patient will demonstrate/report pain at worst less than or equal to 2/10 to facilitate minimal limitation in daily activity secondary to pain symptoms.  Goal status: ongoing 06/06/2023   2. Patient will demonstrate independent use of home exercise program to facilitate ability to maintain/progress functional gains from skilled physical therapy services.  Goal status: ongoing 06/06/2023   3. Patient will demonstrate FOTO outcome > or = 53 % to indicate reduced disability due to condition.  Goal status: ongoing 06/06/2023   4.  Patient will demonstrate right ankle & left knee LE MMT 5/5 throughout to faciltiate usual transfers, stairs, squatting at [facility] for daily life.   Goal status:    ongoing 06/06/2023   5.  Patient ambulates >500' and neg ramps/curbs independently.  Goal status: ongoing 06/06/2023    PLAN:  PT FREQUENCY:  2x/week for 2 weeks, then 1x/wk for 10 weeks  PT DURATION: 12 weeks  PLANNED INTERVENTIONS: Therapeutic exercises, Therapeutic activity, Neuro Muscular re-education, Balance training, Gait training, Patient/Family education, Joint mobilization, Stair training, DME instructions, Dry Needling, Electrical stimulation, Traction, Cryotherapy, vasopneumatic deviceMoist heat, Taping, Ultrasound, Ionotophoresis 4mg /ml Dexamethasone, and aquatic therapy, Manual therapy.  All included unless contraindicated  PLAN FOR NEXT SESSION:  assess TUG, gait velocity comfortable & fast (PT held 8/15 due to back pain). check updated HEP & advance.  Stair training.   Vladimir Faster, PT, DPT 07/04/2023, 12:49 PM     PHYSICAL THERAPY DISCHARGE SUMMARY  Visits from Start of Care: 11  Current  functional level related to goals / functional outcomes: See note   Remaining deficits: See note   Education / Equipment: HEP  Patient goals were partially met. Patient is being discharged due to no return.  Chyrel Masson, PT, DPT, OCS, ATC 08/22/23  10:36 AM

## 2023-07-11 ENCOUNTER — Encounter: Payer: Commercial Managed Care - PPO | Admitting: Physical Therapy

## 2023-07-16 ENCOUNTER — Other Ambulatory Visit (HOSPITAL_COMMUNITY): Payer: Self-pay

## 2023-07-17 ENCOUNTER — Ambulatory Visit (HOSPITAL_BASED_OUTPATIENT_CLINIC_OR_DEPARTMENT_OTHER): Payer: Commercial Managed Care - PPO | Admitting: Cardiovascular Disease

## 2023-07-17 ENCOUNTER — Telehealth: Payer: Self-pay

## 2023-07-17 ENCOUNTER — Encounter (HOSPITAL_BASED_OUTPATIENT_CLINIC_OR_DEPARTMENT_OTHER): Payer: Self-pay | Admitting: Cardiovascular Disease

## 2023-07-17 ENCOUNTER — Telehealth (HOSPITAL_BASED_OUTPATIENT_CLINIC_OR_DEPARTMENT_OTHER): Payer: Self-pay | Admitting: *Deleted

## 2023-07-17 ENCOUNTER — Other Ambulatory Visit (HOSPITAL_COMMUNITY): Payer: Self-pay

## 2023-07-17 VITALS — BP 124/84 | HR 97 | Ht 67.0 in | Wt 230.4 lb

## 2023-07-17 DIAGNOSIS — E785 Hyperlipidemia, unspecified: Secondary | ICD-10-CM

## 2023-07-17 DIAGNOSIS — E1159 Type 2 diabetes mellitus with other circulatory complications: Secondary | ICD-10-CM | POA: Diagnosis not present

## 2023-07-17 DIAGNOSIS — I152 Hypertension secondary to endocrine disorders: Secondary | ICD-10-CM

## 2023-07-17 DIAGNOSIS — E1169 Type 2 diabetes mellitus with other specified complication: Secondary | ICD-10-CM | POA: Diagnosis not present

## 2023-07-17 DIAGNOSIS — I10 Essential (primary) hypertension: Secondary | ICD-10-CM

## 2023-07-17 DIAGNOSIS — I2510 Atherosclerotic heart disease of native coronary artery without angina pectoris: Secondary | ICD-10-CM | POA: Diagnosis not present

## 2023-07-17 DIAGNOSIS — Z006 Encounter for examination for normal comparison and control in clinical research program: Secondary | ICD-10-CM

## 2023-07-17 MED ORDER — VALSARTAN 320 MG PO TABS
320.0000 mg | ORAL_TABLET | Freq: Every day | ORAL | 3 refills | Status: DC
  Filled 2023-07-17 – 2023-10-06 (×2): qty 90, 90d supply, fill #0
  Filled 2024-01-09: qty 90, 90d supply, fill #1
  Filled 2024-04-10: qty 90, 90d supply, fill #2
  Filled 2024-07-12: qty 90, 90d supply, fill #3

## 2023-07-17 MED ORDER — OZEMPIC (0.25 OR 0.5 MG/DOSE) 2 MG/3ML ~~LOC~~ SOPN
0.2500 mg | PEN_INJECTOR | SUBCUTANEOUS | 1 refills | Status: AC
  Filled 2023-07-17: qty 3, 30d supply, fill #0
  Filled 2023-07-25: qty 3, 28d supply, fill #0
  Filled 2023-11-16: qty 3, 28d supply, fill #1

## 2023-07-17 MED ORDER — CARVEDILOL 6.25 MG PO TABS
6.2500 mg | ORAL_TABLET | Freq: Two times a day (BID) | ORAL | 3 refills | Status: DC
  Filled 2023-07-17 – 2023-10-16 (×2): qty 180, 90d supply, fill #0
  Filled 2024-01-09: qty 180, 90d supply, fill #1
  Filled 2024-04-23: qty 180, 90d supply, fill #2

## 2023-07-17 MED ORDER — CHLORTHALIDONE 25 MG PO TABS
25.0000 mg | ORAL_TABLET | Freq: Every day | ORAL | 3 refills | Status: DC
  Filled 2023-07-17 – 2023-08-28 (×2): qty 90, 90d supply, fill #0

## 2023-07-17 MED ORDER — OZEMPIC (0.25 OR 0.5 MG/DOSE) 2 MG/3ML ~~LOC~~ SOPN
0.5000 mg | PEN_INJECTOR | SUBCUTANEOUS | 1 refills | Status: DC
  Filled 2023-07-17: qty 1.5, 28d supply, fill #0
  Filled 2023-08-30: qty 3, 28d supply, fill #0
  Filled 2023-09-30: qty 3, 28d supply, fill #1

## 2023-07-17 MED ORDER — SPIRONOLACTONE 25 MG PO TABS
25.0000 mg | ORAL_TABLET | Freq: Every day | ORAL | 3 refills | Status: DC
  Filled 2023-07-17 – 2023-10-06 (×2): qty 90, 90d supply, fill #0
  Filled 2024-01-09: qty 90, 90d supply, fill #1
  Filled 2024-04-10: qty 90, 90d supply, fill #2
  Filled 2024-07-12: qty 90, 90d supply, fill #3

## 2023-07-17 NOTE — Research (Unsigned)
I saw pt today after Dr. [place]le[place]'s follow up visit. Pt is in Dr. Fillmore's Virtual Care HTN Study. Pt filled out research survey. Pt was enrolled in Group 2. Pt has successfully reached her blood pressure goal and completed the Virtual Care HTN Study. 

## 2023-07-17 NOTE — Patient Instructions (Signed)
Medication Instructions:  START OZEMPIC 0.25 MG WEEKLY FOR 4 WEEKS THEN INCREASE TO 0ZEMPIC 0.5 MG WEEKLY   *If you need a refill on your cardiac medications before your next appointment, please call your pharmacy*  Lab Work: NONE  Testing/Procedures: NONE   Follow-Up: At [facility], you and your health needs are our priority.  As part of our continuing mission to provide you with exceptional heart care, we have created designated Provider Care Teams.  These Care Teams include your primary Cardiologist (physician) and Advanced Practice Providers (APPs -  Physician Assistants and Nurse Practitioners) who all work together to provide you with the care you need, when you need it.  We recommend signing up for the patient portal called "MyChart".  Sign up information is provided on this After Visit Summary.  MyChart is used to connect with patients for Virtual Visits (Telemedicine).  Patients are able to view lab/test results, encounter notes, upcoming appointments, etc.  Non-urgent messages can be sent to your provider as well.   To learn more about what you can do with MyChart, go to [email or web address].    Your next appointment:   4 month(s)  Provider:   Chilton Si, MD or Gillian Shields, NP [place] [facility]    You have been referred to PREP. ([facility] PROGRAM)

## 2023-07-17 NOTE — Telephone Encounter (Signed)
Pharmacy Patient Advocate Encounter   Received notification from Physician's Office that prior authorization for [facility] is required/requested.   Insurance verification completed.   The patient is insured through [facility] .   Per test claim: PA required; PA submitted to [employer] via CoverMyMeds Key/confirmation #[id number]       Status is pending

## 2023-07-17 NOTE — Progress Notes (Signed)
Advanced Hypertension Clinic Follow-up:    Date:  07/17/2023   ID:  Vickie Lane, DOB [birth year], MRN [id number]  PCP:  Ollen Bowl, MD  Cardiologist:  Chilton Si, MD  Nephrologist:  Referring MD: Maurice Small, MD   CC: Hypertension  History of Present Illness:    Vickie Lane is a 62 y.o. female with a hx of hypertension, hyperlipidemia, non-obstructive CAD, CKD stage 2, mitral valve prolapse, sickle cell trait, diabetes, GERD, breast cancer, here for follow-up. She was initially seen 10/03/2021 to establish care in the Advanced Hypertension Clinic.   She last saw Gillian Shields, NP on 08/08/21 for hypertension. Her blood pressure was 186/120. She reported having high blood pressure since she was 62 years old which was worsened with pregnancy and HELLP syndrome. Benazepril was switched to Benicar and Amlodipine was discontinued due to LE edema. At that visit, Olmesartan was discontinued and 320mg  Valsartan daily was started. Clonidine 0.2mg  twice daily was continued. Her blood pressure has been difficult to control over the years so she was referred to the hypertension clinic. She underwent cardiac CTA in 2017 with a calcium score of 34 placing her in 90th percentile for age and sex matched control. She had less than 50% disease which was non-obstructive in proximal LAD and D2 with <30% calcific disease in proximal and mid RCA. Echo from 08/2018 revealed normal function with LVEF 55-60%. Echo from 07/2021 showed no significant changes.   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 Her blood pressure issues began with her first pregnancy at 62 years old causing her to deliver early. Her second child also had to be delivered at [redacted] weeks gestation for similar reasons. Her children are 40 and 74 years old currently. She noted her blood pressure was difficult to control over the preceding year. Hydralazine was added to her regimen. She was referred to PREP and  enrolled in our remote patient monitoring study. Her renin and aldosterone levels were indeterminate. CTA of the abdomen was unremarkable. She had minimal aortic atherosclerosis. She saw Phillips Hay, PharmD 11/2021 and noted feeling poorly when her BP was below 110. She also had been working with the [employer] [facility], and was started on [place]. Her clonidine dose was reduced. Her home blood pressures have remained well controlled on remote patient monitoring.  At her visit 01/2022, her blood pressures were better controlled. She expressed concern about heart rates of 100+ bpm while not at rest, but also not exercising. She presented EKG tracings which did show sinus tachycardia upon review; no evidence of Afib. Hydralazine was switched to carvedilol 12.5 mg BID. She reported some dizziness and low blood pressures in the mornings, so carvedilol was reduced to 6.25 mg BID. She saw our pharmacist 02/2022 and home blood pressures were mostly at goal. Seen by Gillian Shields, NP 12/24/2022 where it was noted that she had been diagnosed with breast cancer 08/2022 and underwent lumpectomy. At that visit she was only on Tamoxifen. Blood pressures were well controlled.  Today, she states she is feeling well. In the office today her blood pressure is 124/84. Her home readings have averaged in the 120s/70-80s. A couple times she has noticed really low blood pressures in the mornings with some dizziness, but not significant enough to impair her overall function. She continues to work on weight loss. Recently she went back to following an intermittent fasting pescatarian diet. Previously on [facility] for about 3-4 months, discontinued due to developing severe  gastric pain and nausea. Her physical exercise has been limited by multiple fractures to her right foot in 01/2023 due to a mechanical fall. She had been wearing a boot and was essentially immobile for about 4 months. Two weeks ago she completed her  physical therapy. Notes that she is now in need of a left knee replacement. She denies any palpitations, chest pain, shortness of breath, peripheral edema, headaches, syncope, orthopnea, or PND.  Previous antihypertensives: Amlodipine - swelling Hydralazine - switched to Carvedilol due to tachycardia   Past Medical History:  Diagnosis Date   Allergy 1990   Arthritis 2011   "back" (11/07/2012)   Breast cancer (HCC)    Chest pain    Chronic lower back pain    Constipation    GERD (gastroesophageal reflux disease) 1991   Heart murmur    "MVP" (`11/07/2012)   History of blood transfusion 1990's   "? w/one of my deliveries" (11/07/2012)   Hyperlipidemia 2019   LDL   Hypertension 1993   Joint pain 2011   osteo arthritis   Kidney problem 2008   Lactose intolerance    Mitral valve prolapse    Seizures (HCC) 1991   "related to pregnancy; I had HELLP" (11/07/2012)- lastseizure was 1991 per pt    Sickle cell trait (HCC)    Systemic lupus erythematosus (HCC) 1992   joint pain   Type II diabetes mellitus (HCC) 08/2011   Diet and exercise.    Past Surgical History:  Procedure Laterality Date   ABDOMINAL HYSTERECTOMY  1997   BREAST LUMPECTOMY WITH RADIOACTIVE SEED LOCALIZATION Left 09/18/2022   Procedure: LEFT BREAST LUMPECTOMY WITH RADIOACTIVE SEED LOCALIZATION;  Surgeon: Manus Rudd, MD;  Location: [facility];  Service: General;  Laterality: Left;   CARPAL TUNNEL RELEASE  2008   "right w/fracture OR" (11/07/2012)   CESAREAN SECTION  1991, 1993   COLONOSCOPY     POLYPECTOMY     POSTERIOR LUMBAR FUSION  11/07/2012   "L4-5" (11/07/2012)   TRIGGER FINGER RELEASE Right 05/04/2016   Procedure: MINOR RELEASE TRIGGER FINGER/A-1 PULLEY;  Surgeon: Dairl Ponder, MD;  Location: [facility];  Service: Orthopedics;  Laterality: Right;   WRIST FRACTURE SURGERY  2008   "right" (11/07/2012)    Current Medications: Current Meds  Medication Sig   aspirin  EC 81 MG tablet Take 81 mg by mouth every morning.   b complex vitamins capsule Take 1 capsule by mouth daily.   Cetirizine-Pseudoephedrine (ZYRTEC-D PO) Take 1 tablet by mouth daily as needed. For allergies   Cholecalciferol (VITAMIN D3 PO) Take 125 mcg by mouth daily.   cyclobenzaprine (FLEXERIL) 10 MG tablet Take 1 tablet (10 mg) by mouth 3 times daily as needed   esomeprazole (NEXIUM) 40 MG capsule Take 40 mg by mouth as needed.   FREESTYLE LITE test strip    Lancets (FREESTYLE) lancets    meloxicam (MOBIC) 7.5 MG tablet Take 7.5 mg by mouth daily as needed for pain.   methylPREDNISolone (MEDROL DOSEPAK) 4 MG TBPK tablet Follow package instructions   Multiple Vitamin (MULTIVITAMIN WITH MINERALS) TABS Take 1 tablet by mouth daily.   predniSONE (STERAPRED UNI-PAK 21 TAB) 10 MG (21) TBPK tablet Take as directed   Semaglutide,0.25 or 0.5MG /DOS, (OZEMPIC, 0.25 OR 0.5 MG/DOSE,) 2 MG/3ML SOPN Take 0.25 mg by mouth once a week for 28 days for 4 weeks then increase to 0.5mg  once a week   [START ON 08/14/2023] Semaglutide,0.25 or 0.5MG /DOS, (OZEMPIC, 0.25 OR 0.5  MG/DOSE,) 2 MG/3ML SOPN Inject 0.5 mg into the skin once a week. TO START WHEN 0.25 MG COMPLETED   simvastatin (ZOCOR) 40 MG tablet Take 1 tablet (40 mg total) by mouth at bedtime.   tamoxifen (NOLVADEX) 20 MG tablet Take 1 tablet (20 mg total) by mouth daily.   valACYclovir (VALTREX) 500 MG tablet Take 1 tablet (500 mg total) by mouth daily.   vitamin B-12 (CYANOCOBALAMIN) 1000 MCG tablet Take 5,000 mcg by mouth daily.   [DISCONTINUED] carvedilol (COREG) 6.25 MG tablet Take 1 tablet (6.25 mg total) by mouth 2 (two) times daily.   [DISCONTINUED] chlorthalidone (HYGROTON) 25 MG tablet Take 1 tablet (25 mg total) by mouth daily.   [DISCONTINUED] esomeprazole (NEXIUM) 20 MG packet Take 20 mg by mouth as needed.   [DISCONTINUED] spironolactone (ALDACTONE) 25 MG tablet Take 1 tablet (25 mg total) by mouth daily.   [DISCONTINUED] valsartan (DIOVAN)  320 MG tablet Take 1 tablet (320 mg total) by mouth daily.     Allergies:   Amlodipine, Amlodipine besylate, Ciprofloxacin, Codeine, Hydrocodone, Levofloxacin, Ultram [tramadol hcl], and Hydrocodone-acetaminophen   Social History   Socioeconomic History   Marital status: Single    Spouse name: Not on file   Number of children: Not on file   Years of education: Not on file   Highest education level: Not on file  Occupational History   Occupation: Acupuncturist: [employer]  Tobacco Use   Smoking status: Never   Smokeless tobacco: Never  Vaping Use   Vaping status: Never Used  Substance and Sexual Activity   Alcohol use: Yes    Comment: 11/07/2012 "once/month might have a glass of wine"   Drug use: No   Sexual activity: Yes    Birth control/protection: Surgical  Other Topics Concern   Not on file  Social History Narrative   Not on file   Social Determinants of Health   Financial Resource Strain: Low Risk  (09/05/2022)   Overall Financial Resource Strain (CARDIA)    Difficulty of Paying Living Expenses: Not hard at all  Food Insecurity: No Food Insecurity (09/05/2022)   Hunger Vital Sign    Worried About Running Out of Food in the Last Year: Never true    Ran Out of Food in the Last Year: Never true  Transportation Needs: No Transportation Needs (09/05/2022)   PRAPARE - Administrator, Civil Service (Medical): No    Lack of Transportation (Non-Medical): No  Physical Activity: Sufficiently Active (10/03/2021)   Exercise Vital Sign    Days of Exercise per Week: 4 days    Minutes of Exercise per Session: 50 min  Stress: Not on file  Social Connections: Not on file     Family History: The patient's family history includes Cancer in her paternal aunt; Heart attack in her father; Heart disease in an other family member; Hypertension in her father, mother, and another family member; Sudden death in her father. There is no history of Colon cancer,  Colon polyps, Rectal cancer, Stomach cancer, or Esophageal cancer.  ROS:   Please see the history of present illness.   (+) Mild dizziness All other systems reviewed and are negative.  EKGs/Labs/Other Studies Reviewed:    Bilateral Renal Artery Dopplers 10/24/2021: Summary:  Largest Aortic Diameter: 2.4 cm     Renal:     Right: Normal size right kidney. Normal right Resisitive Index.         Normal cortical thickness of  right kidney. No evidence of         right renal artery stenosis. RRV flow present.  Left:  Normal size of left kidney. Normal left Resistive Index.         Normal cortical thickness of the left kidney. No evidence of         left renal artery stenosis. LRV flow present.  Mesenteric:  Normal Celiac artery and Superior Mesenteric artery findings.   Echo 08/16/21 1. Left ventricular ejection fraction, by estimation, is 55 to 60%. The  left ventricle has normal function. The left ventricle has no regional  wall motion abnormalities. Left ventricular diastolic parameters are  indeterminate.   2. Right ventricular systolic function is normal. The right ventricular  size is normal. There is normal pulmonary artery systolic pressure. The  estimated right ventricular systolic pressure is 32.8 mmHg.   3. The mitral valve is grossly normal. Trivial mitral valve  regurgitation. No evidence of mitral stenosis.   4. The aortic valve is tricuspid. Aortic valve regurgitation is not  visualized. No aortic stenosis is present.   5. The inferior vena cava is normal in size with greater than 50%  respiratory variability, suggesting right atrial pressure of 3 mmHg.  Comparison(s): No significant change from prior study.   Nuclear Stress Test 09/17/18 Nuclear stress EF: 55%. There was no ST segment deviation noted during stress. The study is normal. This is a low risk study. The left ventricular ejection fraction is normal (55-65%). Normal perfusion EF 55% Baseline ECG with  inferolateral T wave changes deemed non diagnostic during stress  Echo 09/17/18 - Left ventricle: The cavity size was normal. Wall thickness was normal. Systolic function was normal. The estimated ejection fraction was in the range of 55% to 60%. Left ventricular diastolic function parameters were normal.  - Aortic valve: Sclerosis without stenosis.  - Mitral valve: Calcified annulus. Mildly thickened leaflets .  - Left atrium: The atrium was mildly dilated.  - Atrial septum: No defect or patent foramen ovale was identified.  - Pulmonary arteries: PA peak pressure: 32 mm Hg (S).  CT Coronary Morph with Ca Score 04/04/16 IMPRESSION: 1) Calcium Score 34 90th percentile for age and sex matched controls 2) Less than 50% calcific non obstructive disease in proximal LAD and D2 with less than 30% calcific disease in proximal and mid RCA 3) Normal aortic root  FINDINGS: Within the visualized portions of the thorax there are no suspicious appearing pulmonary nodules or masses, there is no acute consolidative airspace disease, no pleural effusions, no pneumothorax, and no lymphadenopathy. Visualized portions of the upper abdomen are unremarkable. There are no aggressive appearing lytic or blastic lesions noted in the visualized portions of the skeleton. IMPRESSION: 1. No significant incidental noncardiac findings are noted.   EKG:  EKG is personally reviewed. 07/17/2023:  Not ordered. 02/07/2022: EKG was not ordered. 08/08/2021 Gillian Shields, NP): NSR 78 bpm with stable inferolateral T wave changes.   Recent Labs: 02/14/2023: ALT 13; BUN 14; Creatinine 1.35; Hemoglobin 12.0; Platelet Count 270; Potassium 3.6; Sodium 142   Recent Lipid Panel    Component Value Date/Time   CHOL 170 03/29/2016 0847   TRIG 41 03/29/2016 0847   HDL 83 03/29/2016 0847   CHOLHDL 2.0 03/29/2016 0847   VLDL 8 03/29/2016 0847   LDLCALC 79 03/29/2016 0847    Physical Exam:    VS:  BP 124/84 (BP Location:  Left Arm, Patient Position: Sitting, Cuff Size: Large)   Pulse  97   Ht 5\' 7"  (1.702 m)   Wt 230 lb 6.4 oz (104.5 kg)   SpO2 95%   BMI 36.09 kg/m  , BMI Body mass index is 36.09 kg/m. GENERAL:  Well appearing HEENT: Pupils equal round and reactive, fundi not visualized, oral mucosa unremarkable NECK:  No jugular venous distention, waveform within normal limits, carotid upstroke brisk and symmetric, no bruits, no thyromegaly LUNGS:  Clear to auscultation bilaterally HEART:  RRR.  PMI not displaced or sustained,S1 and S2 within normal limits, no S3, no S4, no clicks, no rubs, no murmurs ABD:  Flat, positive bowel sounds normal in frequency in pitch, no bruits, no rebound, no guarding, no midline pulsatile mass, no hepatomegaly, no splenomegaly EXT:  2 plus pulses throughout, no edema, no cyanosis no clubbing SKIN:  No rashes no nodules NEURO:  Cranial nerves II through XII grossly intact, motor grossly intact throughout PSYCH:  Cognitively intact, oriented to person place and time   ASSESSMENT/PLAN:    # Hypertension Well-controlled on current regimen of carvedilol, chlorthalidone, spironolactone, and valsartan. Reports home blood pressure readings in the 120s/70s-80s. -Continue current medication regimen. -Ensure three-month supply of medications is available.   # Non-obstructive CAD:  # Hyperlipidemia Last LDL was 75, slightly above the target of <70 for patients with coronary artery disease. Last cholesterol check was approximately a year ago. -Plan for cholesterol check during upcoming physical on September 3rd or 5th. -Consider switching from simvastatin to a different statin (rosuvastatin or atorvastatin) if LDL remains above target, as these may be more potent with fewer side effects.  # Diabetes Has a diagnosis of diabetes. Previously tried [employer] but experienced stomach pain and nausea. Fructosamine was 288, indicating elevated risk. -Start Ozempic at 0.25 mg once a week  for four weeks, then increase to 0.5 mg once a week if tolerated. -Follow up in four months to assess response to Ozempic.   # Foot Fracture Healing from a foot fracture sustained in March. Recently completed physical therapy. -Encouraged to gradually increase physical activity as tolerated, with consideration for low-impact exercises such as swimming or cycling.  # Knee Pain Reports needing a knee replacement due to persistent pain. Surgery delayed due to foot fracture. -Continue current pain management strategies. -Plan for knee replacement surgery once foot is fully healed.  General Health Maintenance -Consider participation in exercise and diet programs such as PREP or Right Start for additional support in managing weight and diabetes. -Continue with regular cardiology follow-ups, with next appointment in one year.      Will graduate from HTN clinic, she would prefer to schedule with me for general cardiology follow-up   Screening for Secondary Hypertension:     10/03/2021    4:19 PM 02/07/2022    9:47 AM  Causes  Drugs/Herbals Screened Screened     - Comments limits sodium. no caffeine.  Rare EtOH.  No more NSAIDS   Renovascular HTN Screened Screened     - Comments Check renal artery Dopplers No RAS on ultrasound or CT  Sleep Apnea Screened   Thyroid Disease Screened Screened  Hyperaldosteronism Screened Screened     - Comments Check renin and aldosterone high ratio but no adenomas/hyperplasia on CT  Pheochromocytoma N/A Screened  Cushing's Syndrome N/A N/A  Hyperparathyroidism Screened Screened  Coarctation of the Aorta Screened Screened     - Comments BP symmetric BP symmetric  Compliance Screened Screened    Relevant Labs/Studies:    Latest Ref Rng & Units 02/14/2023  3:09 PM 09/05/2022    8:16 AM 10/27/2021    2:35 PM  Basic Labs  Sodium 135 - 145 mmol/L 142  137  143   Potassium 3.5 - 5.1 mmol/L 3.6  4.1  4.0   Creatinine 0.44 - 1.00 mg/dL 2.95  6.21  3.08         Latest Ref Rng & Units 08/08/2021    4:14 PM  Thyroid   TSH 0.450 - 4.500 uIU/mL 1.820        Latest Ref Rng & Units 10/09/2021    8:04 AM  Renin/Aldosterone   Aldosterone 0.0 - 30.0 ng/dL 65.7   Renin 8.469 - 6.295 ng/mL/hr 0.272   Aldos/Renin Ratio 0.0 - 30.0 68.4              10/24/2021   10:08 AM  Renovascular   Renal Artery [place] Completed Yes    Disposition:    FU with Derris Millan C. Duke Salvia, MD, [facility] in 1 year  Medication Adjustments/Labs and Tests Ordered: Current medicines are reviewed at length with the patient today.  Concerns regarding medicines are outlined above.   Orders Placed This Encounter  Procedures   Amb Referral To Provider Referral Exercise Program (P.R.E.P)   Meds ordered this encounter  Medications   Semaglutide,0.25 or 0.5MG /DOS, (OZEMPIC, 0.25 OR 0.5 MG/DOSE,) 2 MG/3ML SOPN    Sig: Take 0.25 mg by mouth once a week for 28 days for 4 weeks then increase to 0.5mg  once a week    Dispense:  3 mL    Refill:  1   Semaglutide,0.25 or 0.5MG /DOS, (OZEMPIC, 0.25 OR 0.5 MG/DOSE,) 2 MG/3ML SOPN    Sig: Inject 0.5 mg into the skin once a week. TO START WHEN 0.25 MG COMPLETED    Dispense:  3 mL    Refill:  1   chlorthalidone (HYGROTON) 25 MG tablet    Sig: Take 1 tablet (25 mg total) by mouth daily.    Dispense:  90 tablet    Refill:  3   valsartan (DIOVAN) 320 MG tablet    Sig: Take 1 tablet (320 mg total) by mouth daily.    Dispense:  90 tablet    Refill:  3   carvedilol (COREG) 6.25 MG tablet    Sig: Take 1 tablet (6.25 mg total) by mouth 2 (two) times daily.    Dispense:  180 tablet    Refill:  3   spironolactone (ALDACTONE) 25 MG tablet    Sig: Take 1 tablet (25 mg total) by mouth daily.    Dispense:  90 tablet    Refill:  3   I,Mathew Stumpf,acting as a scribe for Chilton Si, MD.,have documented all relevant documentation on the behalf of Chilton Si, MD,as directed by  Chilton Si, MD while in the presence of Chilton Si, MD.  I, Nichole Neyer C. Duke Salvia, MD have reviewed all documentation for this visit.  The documentation of the exam, diagnosis, procedures, and orders on 07/17/2023 are all accurate and complete.  Signed, Chilton Si, MD  07/17/2023 12:22 PM    [facility]

## 2023-07-17 NOTE — Telephone Encounter (Signed)
Patient started on Ozempic at office visit today  Will forward to PA team to initiate prior authorization

## 2023-07-18 ENCOUNTER — Encounter: Payer: Commercial Managed Care - PPO | Admitting: Physical Therapy

## 2023-07-19 ENCOUNTER — Other Ambulatory Visit (HOSPITAL_COMMUNITY): Payer: Self-pay

## 2023-07-19 NOTE — Telephone Encounter (Signed)
Ozempic has been APPROVED from 07/17/23 to 07/16/24. Ran test claim, Copay is $24.99. This test claim was processed through [facility]- copay amounts may vary at other pharmacies due to pharmacy/plan contracts, or as the patient moves through the different stages of their insurance plan.   Above received from PA team, mychart message sent to patient  Left message at pharmacy approved

## 2023-07-19 NOTE — Telephone Encounter (Signed)
Pharmacy Patient Advocate Encounter  Received notification from [facility] that Prior Authorization for Ozempic has been APPROVED from 07/17/23 to 07/16/24. Ran test claim, Copay is $24.99. This test claim was processed through [facility]- copay amounts may vary at other pharmacies due to pharmacy/plan contracts, or as the patient moves through the different stages of their insurance plan.   PA #/Case ID/Reference #: [id number]

## 2023-07-23 ENCOUNTER — Other Ambulatory Visit (HOSPITAL_COMMUNITY): Payer: Self-pay

## 2023-07-25 ENCOUNTER — Other Ambulatory Visit: Payer: Self-pay

## 2023-07-25 ENCOUNTER — Other Ambulatory Visit (HOSPITAL_COMMUNITY): Payer: Self-pay

## 2023-07-25 DIAGNOSIS — E1121 Type 2 diabetes mellitus with diabetic nephropathy: Secondary | ICD-10-CM | POA: Diagnosis not present

## 2023-07-25 DIAGNOSIS — E7800 Pure hypercholesterolemia, unspecified: Secondary | ICD-10-CM | POA: Diagnosis not present

## 2023-07-25 DIAGNOSIS — D573 Sickle-cell trait: Secondary | ICD-10-CM | POA: Diagnosis not present

## 2023-07-25 DIAGNOSIS — N182 Chronic kidney disease, stage 2 (mild): Secondary | ICD-10-CM | POA: Diagnosis not present

## 2023-07-25 DIAGNOSIS — I700 Atherosclerosis of aorta: Secondary | ICD-10-CM | POA: Diagnosis not present

## 2023-07-25 DIAGNOSIS — Z0000 Encounter for general adult medical examination without abnormal findings: Secondary | ICD-10-CM | POA: Diagnosis not present

## 2023-07-25 DIAGNOSIS — D0512 Intraductal carcinoma in situ of left breast: Secondary | ICD-10-CM | POA: Diagnosis not present

## 2023-07-25 DIAGNOSIS — E669 Obesity, unspecified: Secondary | ICD-10-CM | POA: Diagnosis not present

## 2023-07-25 DIAGNOSIS — M329 Systemic lupus erythematosus, unspecified: Secondary | ICD-10-CM | POA: Diagnosis not present

## 2023-07-25 DIAGNOSIS — I129 Hypertensive chronic kidney disease with stage 1 through stage 4 chronic kidney disease, or unspecified chronic kidney disease: Secondary | ICD-10-CM | POA: Diagnosis not present

## 2023-07-25 NOTE — Telephone Encounter (Signed)
Patient viewed in mychart.

## 2023-07-28 ENCOUNTER — Encounter: Payer: Self-pay | Admitting: Orthopaedic Surgery

## 2023-07-29 ENCOUNTER — Ambulatory Visit
Admission: RE | Admit: 2023-07-29 | Discharge: 2023-07-29 | Disposition: A | Discharge status: Home / Self Care | Payer: Commercial Managed Care - PPO | Source: Ambulatory Visit | Attending: Adult Health | Admitting: Adult Health

## 2023-07-29 DIAGNOSIS — Z853 Personal history of malignant neoplasm of breast: Secondary | ICD-10-CM | POA: Diagnosis not present

## 2023-07-29 DIAGNOSIS — D0512 Intraductal carcinoma in situ of left breast: Secondary | ICD-10-CM

## 2023-07-31 ENCOUNTER — Other Ambulatory Visit (HOSPITAL_COMMUNITY): Payer: Self-pay | Admitting: Internal Medicine

## 2023-07-31 DIAGNOSIS — Z853 Personal history of malignant neoplasm of breast: Secondary | ICD-10-CM

## 2023-07-31 DIAGNOSIS — M545 Low back pain, unspecified: Secondary | ICD-10-CM

## 2023-08-05 NOTE — Telephone Encounter (Signed)
See message.  Thanks.

## 2023-08-06 NOTE — Telephone Encounter (Signed)
thx

## 2023-08-08 ENCOUNTER — Ambulatory Visit (HOSPITAL_COMMUNITY)
Admission: RE | Admit: 2023-08-08 | Discharge: 2023-08-08 | Disposition: A | Discharge status: Home / Self Care | Payer: Commercial Managed Care - PPO | Source: Ambulatory Visit | Attending: Internal Medicine | Admitting: Internal Medicine

## 2023-08-08 DIAGNOSIS — M4316 Spondylolisthesis, lumbar region: Secondary | ICD-10-CM | POA: Diagnosis not present

## 2023-08-08 DIAGNOSIS — M545 Low back pain, unspecified: Secondary | ICD-10-CM | POA: Insufficient documentation

## 2023-08-08 DIAGNOSIS — Z853 Personal history of malignant neoplasm of breast: Secondary | ICD-10-CM | POA: Insufficient documentation

## 2023-08-08 DIAGNOSIS — M48061 Spinal stenosis, lumbar region without neurogenic claudication: Secondary | ICD-10-CM | POA: Diagnosis not present

## 2023-08-08 DIAGNOSIS — M5126 Other intervertebral disc displacement, lumbar region: Secondary | ICD-10-CM | POA: Diagnosis not present

## 2023-08-08 MED ORDER — GADOBUTROL 1 MMOL/ML IV SOLN
10.0000 mL | Freq: Once | INTRAVENOUS | Status: AC | PRN
  Administered 2023-08-08: 10 mL via INTRAVENOUS

## 2023-08-09 DIAGNOSIS — N179 Acute kidney failure, unspecified: Secondary | ICD-10-CM | POA: Diagnosis not present

## 2023-08-22 ENCOUNTER — Other Ambulatory Visit (HOSPITAL_COMMUNITY): Payer: Self-pay

## 2023-08-22 ENCOUNTER — Inpatient Hospital Stay: Payer: Commercial Managed Care - PPO | Attending: Hematology and Oncology | Admitting: Hematology and Oncology

## 2023-08-22 VITALS — BP 127/70 | HR 99 | Temp 97.5°F | Resp 16 | Wt 226.4 lb

## 2023-08-22 DIAGNOSIS — Z1721 Progesterone receptor positive status: Secondary | ICD-10-CM | POA: Insufficient documentation

## 2023-08-22 DIAGNOSIS — Z8049 Family history of malignant neoplasm of other genital organs: Secondary | ICD-10-CM | POA: Diagnosis not present

## 2023-08-22 DIAGNOSIS — Z79810 Long term (current) use of selective estrogen receptor modulators (SERMs): Secondary | ICD-10-CM | POA: Insufficient documentation

## 2023-08-22 DIAGNOSIS — D0512 Intraductal carcinoma in situ of left breast: Secondary | ICD-10-CM | POA: Diagnosis not present

## 2023-08-22 DIAGNOSIS — Z170 Estrogen receptor positive status [ER+]: Secondary | ICD-10-CM | POA: Diagnosis not present

## 2023-08-22 DIAGNOSIS — R232 Flushing: Secondary | ICD-10-CM | POA: Insufficient documentation

## 2023-08-22 DIAGNOSIS — Z90710 Acquired absence of both cervix and uterus: Secondary | ICD-10-CM | POA: Insufficient documentation

## 2023-08-22 MED ORDER — VENLAFAXINE HCL ER 75 MG PO CP24
75.0000 mg | ORAL_CAPSULE | Freq: Every day | ORAL | 3 refills | Status: DC
  Filled 2023-08-22: qty 30, 30d supply, fill #0
  Filled 2023-09-17: qty 30, 30d supply, fill #1
  Filled 2023-10-06 – 2023-10-16 (×2): qty 30, 30d supply, fill #2
  Filled 2023-11-18: qty 30, 30d supply, fill #3

## 2023-08-22 NOTE — Progress Notes (Signed)
[facility] CONSULT NOTE  Patient Care Team: Ollen Bowl, MD as PCP - General (Internal Medicine) Chilton Si, MD as PCP - Cardiology (Cardiology) Manus Rudd, MD as Consulting Physician (General Surgery) Rachel Moulds, MD as Consulting Physician (Hematology and Oncology)  CHIEF COMPLAINTS/PURPOSE OF CONSULTATION:  Breast cancer follow up  HISTORY OF PRESENTING ILLNESS:  Vickie Lane 62 y.o. female is here because of recent diagnosis of left breast DCIS  I reviewed her records extensively and collaborated the history with the patient.  SUMMARY OF ONCOLOGIC HISTORY: Oncology History  Ductal carcinoma in situ (DCIS) of left breast  07/27/2022 Mammogram   Bilateral screening mammogram showed left breast calcifications warranting further evaluation.  No findings suspicious for malignancy in the right breast.  Diagnostic mammogram of the left breast confirmed 1.2 x 0.8 x 1.1 cm hypoechoic circumscribed mass in the left breast at 2:00, no hilar fat or convincing hilar blood flow.  Ultrasound showed several normal lymph nodes.  No enlarged or abnormal left axillary lymph nodes.   08/21/2022 Pathology Results   Pathology from the left breast needle core biopsy showed intermediate nuclear grade DCIS, necrosis present, calcs present, ER 95% positive strong staining PR 40% positive strong staining   09/10/2022 Genetic Testing   Negative [facility] CustomNext-Cancer +RNAinsight Panel.  Report date is 09/23/2022.    The CustomNext-Cancer+RNAinsight panel offered by [employer] includes sequencing and rearrangement analysis for the following 50 genes:  APC, ATM, BAP1, BARD1, BMPR1A, BRCA1, BRCA2, BRIP1, CDH1, CDK4, CDKN2A, CHEK2, DICER1, MEN1, MLH1, MSH2, MSH6, MUTYH, NBN, NF1, NF2, NTHL1, PALB2, PMS2, POT1, PTEN, RAD51C, RAD51D, RECQL, RET, SDHA, SDHAF2, SDHB, SDHC, SDHD, SMAD4, SMARCA4, STK11, TP53, TSC1, TSC2 and VHL (sequencing and deletion/duplication);  AXIN2, HOXB13, MITF, MSH3, POLD1 and POLE (sequencing only); EPCAM and GREM1 (deletion/duplication only). RNA data is routinely analyzed for use in variant interpretation for all genes.       09/18/2022 Surgery   DCIS, 1.5cm, no SLN removed   11/2022 -  Anti-estrogen oral therapy   Tamoxifen   She had left breast lumpectomy since last visit which showed DCIS, intermediate grade, 1.5 cm, margins are negative, prior prognostic showed ER 95% positive PR 40% positive  She is also on estrogen patch and has been on it for almost 20 years.  About 20 years ago she had a hysterectomy, no oophorectomy and apparently had severe hot flashes and hence was started on estrogen supplementation.    Interval History  The patient, with a history of lupus and breast cancer, presents with severe hot flashes occurring hourly. She was previously on estrogen for over 20 years, which was recently discontinued. She has tried Effexor and Neurontin in the past for hot flash management, but found them ineffective. She is currently not on any medication for hot flashes.  In addition to the hot flashes, the patient is struggling with weight gain. She has been on Ozempic for four weeks, initially on a loading dose and now increasing to 0.5mg . She reports no significant weight loss yet. She previously tried [facility], but experienced significant GI side effects and found it ineffective for weight loss.  The patient also reports a history of a broken foot and ongoing knee and lower back pain. She is scheduled for a knee replacement in November. She was previously on a Medrol Dosepak and prednisone for these issues.  She is currently taking tamoxifen daily for breast cancer management and requires a refill. She also takes B12 supplements. She recently had  a mammogram with no reported issues and an MRI of her back, the results of which are pending.  Rest of the pertinent 10 point ROS reviewed and negative  MEDICAL HISTORY:   Past Medical History:  Diagnosis Date   Allergy 1990   Arthritis 2011   "back" (11/07/2012)   Breast cancer (HCC)    Chest pain    Chronic lower back pain    Constipation    GERD (gastroesophageal reflux disease) 1991   Heart murmur    "MVP" (`11/07/2012)   History of blood transfusion 1990's   "? w/one of my deliveries" (11/07/2012)   Hyperlipidemia 2019   LDL   Hypertension 1993   Joint pain 2011   osteo arthritis   Kidney problem 2008   Lactose intolerance    Mitral valve prolapse    Seizures (HCC) 1991   "related to pregnancy; I had HELLP" (11/07/2012)- lastseizure was 1991 per pt    Sickle cell trait (HCC)    Systemic lupus erythematosus (HCC) 1992   joint pain   Type II diabetes mellitus (HCC) 08/2011   Diet and exercise.    SURGICAL HISTORY: Past Surgical History:  Procedure Laterality Date   ABDOMINAL HYSTERECTOMY  1997   BREAST LUMPECTOMY WITH RADIOACTIVE SEED LOCALIZATION Left 09/18/2022   Procedure: LEFT BREAST LUMPECTOMY WITH RADIOACTIVE SEED LOCALIZATION;  Surgeon: Manus Rudd, MD;  Location: [facility];  Service: General;  Laterality: Left;   CARPAL TUNNEL RELEASE  2008   "right w/fracture OR" (11/07/2012)   CESAREAN SECTION  1991, 1993   COLONOSCOPY     POLYPECTOMY     POSTERIOR LUMBAR FUSION  11/07/2012   "L4-5" (11/07/2012)   TRIGGER FINGER RELEASE Right 05/04/2016   Procedure: MINOR RELEASE TRIGGER FINGER/A-1 PULLEY;  Surgeon: Dairl Ponder, MD;  Location: [facility];  Service: Orthopedics;  Laterality: Right;   WRIST FRACTURE SURGERY  2008   "right" (11/07/2012)    SOCIAL HISTORY: Social History   Socioeconomic History   Marital status: Single    Spouse name: Not on file   Number of children: Not on file   Years of education: Not on file   Highest education level: Not on file  Occupational History   Occupation: Acupuncturist: [employer]  Tobacco Use   Smoking status: Never    Smokeless tobacco: Never  Vaping Use   Vaping status: Never Used  Substance and Sexual Activity   Alcohol use: Yes    Comment: 11/07/2012 "once/month might have a glass of wine"   Drug use: No   Sexual activity: Yes    Birth control/protection: Surgical  Other Topics Concern   Not on file  Social History Narrative   Not on file   Social Determinants of Health   Financial Resource Strain: Low Risk  (09/05/2022)   Overall Financial Resource Strain (CARDIA)    Difficulty of Paying Living Expenses: Not hard at all  Food Insecurity: No Food Insecurity (09/05/2022)   Hunger Vital Sign    Worried About Running Out of Food in the Last Year: Never true    Ran Out of Food in the Last Year: Never true  Transportation Needs: No Transportation Needs (09/05/2022)   PRAPARE - Administrator, Civil Service (Medical): No    Lack of Transportation (Non-Medical): No  Physical Activity: Sufficiently Active (10/03/2021)   Exercise Vital Sign    Days of Exercise per Week: 4 days    Minutes of  Exercise per Session: 50 min  Stress: Not on file  Social Connections: Not on file  Intimate Partner Violence: Not on file    FAMILY HISTORY: Family History  Problem Relation Age of Onset   Hypertension Mother    Hypertension Father    Heart attack Father    Sudden death Father    Cancer Paternal Aunt        GYN (uterine/cervical); d. mid 69s   Heart disease Other    Hypertension Other    Colon cancer Neg Hx    Colon polyps Neg Hx    Rectal cancer Neg Hx    Stomach cancer Neg Hx    Esophageal cancer Neg Hx     ALLERGIES:  is allergic to amlodipine, amlodipine besylate, ciprofloxacin, codeine, hydrocodone, levofloxacin, ultram [tramadol hcl], and hydrocodone-acetaminophen.  MEDICATIONS:  Current Outpatient Medications  Medication Sig Dispense Refill   venlafaxine XR (EFFEXOR-XR) 75 MG 24 hr capsule Take 1 capsule (75 mg total) by mouth daily with breakfast. 30 capsule 3   aspirin  EC 81 MG tablet Take 81 mg by mouth every morning.     b complex vitamins capsule Take 1 capsule by mouth daily.     carvedilol (COREG) 6.25 MG tablet Take 1 tablet (6.25 mg total) by mouth 2 (two) times daily. 180 tablet 3   Cetirizine-Pseudoephedrine (ZYRTEC-D PO) Take 1 tablet by mouth daily as needed. For allergies     chlorthalidone (HYGROTON) 25 MG tablet Take 1 tablet (25 mg total) by mouth daily. 90 tablet 3   Cholecalciferol (VITAMIN D3 PO) Take 125 mcg by mouth daily.     cyclobenzaprine (FLEXERIL) 10 MG tablet Take 1 tablet (10 mg) by mouth 3 times daily as needed 15 tablet 0   esomeprazole (NEXIUM) 40 MG capsule Take 40 mg by mouth as needed.     FREESTYLE LITE test strip   11   Lancets (FREESTYLE) lancets   12   meloxicam (MOBIC) 7.5 MG tablet Take 7.5 mg by mouth daily as needed for pain.     Multiple Vitamin (MULTIVITAMIN WITH MINERALS) TABS Take 1 tablet by mouth daily.     predniSONE (STERAPRED UNI-PAK 21 TAB) 10 MG (21) TBPK tablet Take as directed 21 tablet 3   Semaglutide,0.25 or 0.5MG /DOS, (OZEMPIC, 0.25 OR 0.5 MG/DOSE,) 2 MG/3ML SOPN Inject 0.25 mg into the skin once a week for 4 weeks, then increase to 0.5mg  weekly. 3 mL 1   Semaglutide,0.25 or 0.5MG /DOS, (OZEMPIC, 0.25 OR 0.5 MG/DOSE,) 2 MG/3ML SOPN Inject 0.5 mg into the skin once a week. TO START WHEN 0.25 MG COMPLETED 3 mL 1   simvastatin (ZOCOR) 40 MG tablet Take 1 tablet (40 mg total) by mouth at bedtime. 90 tablet 3   spironolactone (ALDACTONE) 25 MG tablet Take 1 tablet (25 mg total) by mouth daily. 90 tablet 3   tamoxifen (NOLVADEX) 20 MG tablet Take 1 tablet (20 mg total) by mouth daily. 90 tablet 3   valACYclovir (VALTREX) 500 MG tablet Take 1 tablet (500 mg total) by mouth daily. 30 tablet 11   valsartan (DIOVAN) 320 MG tablet Take 1 tablet (320 mg total) by mouth daily. 90 tablet 3   vitamin B-12 (CYANOCOBALAMIN) 1000 MCG tablet Take 5,000 mcg by mouth daily.     No current facility-administered medications  for this visit.    REVIEW OF SYSTEMS:   Constitutional: Denies fevers, chills or abnormal night sweats Eyes: Denies blurriness of vision, double vision or watery eyes Ears,  nose, mouth, throat, and face: Denies mucositis or sore throat Respiratory: Denies cough, dyspnea or wheezes Cardiovascular: Denies palpitation, chest discomfort or lower extremity swelling Gastrointestinal:  Denies nausea, heartburn or change in bowel habits Skin: Denies abnormal skin rashes Lymphatics: Denies new lymphadenopathy or easy bruising Neurological:Denies numbness, tingling or new weaknesses Behavioral/Psych: Mood is stable, no new changes  Breast: Denies any palpable lumps or discharge All other systems were reviewed with the patient and are negative.  PHYSICAL EXAMINATION: ECOG PERFORMANCE STATUS: 0 - Asymptomatic  Vitals:   08/22/23 1604  BP: 127/70  Pulse: 99  Resp: 16  Temp: (!) 97.5 F (36.4 C)  SpO2: 99%   Filed Weights   08/22/23 1604  Weight: 226 lb 6.4 oz (102.7 kg)    NECK: No cervical lymphadenopathy. BREAST: Breasts without masses, tenderness, or asymmetry. Great cosmetic outcome. EXTREMITIES: Legs without swelling.  LABORATORY DATA:  I have reviewed the data as listed Lab Results  Component Value Date   WBC 5.1 02/14/2023   HGB 12.0 02/14/2023   HCT 35.9 (L) 02/14/2023   MCV 84.7 02/14/2023   PLT 270 02/14/2023   Lab Results  Component Value Date   NA 142 02/14/2023   K 3.6 02/14/2023   CL 107 02/14/2023   CO2 30 02/14/2023    RADIOGRAPHIC STUDIES: I have personally reviewed the radiological reports and agreed with the findings in the report.  ASSESSMENT AND PLAN:  Ductal carcinoma in situ (DCIS) of left breast This is a very pleasant 62 year old female patient with newly diagnosed left breast DCIS, ER/PR positive referred to breast MDC for recommendations.  She is now status post left breast lumpectomy here  on adjuvant tamoxifen. She wasn't offered radiation  because of some healing concerns from lupus.  Menopausal Symptoms Severe hot flashes occurring hourly. Previous trials of Effexor and Neurontin were ineffective. Patient was previously on estrogen therapy for over 20 years. -Start Effexor 75mg  daily. -Advise patient to continue medication for 2-4 weeks before assessing efficacy. -Advise patient not to stop medication suddenly due to risk of withdrawal.  Breast Cancer On Tamoxifen, requesting refill. No new symptoms or concerns. -Refill Tamoxifen prescription.  Weight Gain Patient recently started on Ozempic for weight management, currently on loading dose. -Continue Ozempic, increase to 0.5mg  as planned. -Advise patient to monitor for constipation and manage accordingly.  General Health Maintenance -Recent mammogram with no reported abnormalities. -Recent MRI of back pending results. -Plan for knee replacement in November. -Follow-up in 6 months, or sooner if issues with Effexor.    Total time spent: 30 min including history, physical exam, review of records, counseling and coordination of care  All questions were answered. The patient knows to call the clinic with any problems, questions or concerns.    Rachel Moulds, MD 08/22/23

## 2023-08-22 NOTE — Assessment & Plan Note (Signed)
This is a very pleasant 62 year old female patient with newly diagnosed left breast DCIS, ER/PR positive referred to breast MDC for recommendations.  She is now status post left breast lumpectomy here  on adjuvant tamoxifen. She wasn't offered radiation because of some healing concerns from lupus.  Menopausal Symptoms Severe hot flashes occurring hourly. Previous trials of Effexor and Neurontin were ineffective. Patient was previously on estrogen therapy for over 20 years. -Start Effexor 75mg  daily. -Advise patient to continue medication for 2-4 weeks before assessing efficacy. -Advise patient not to stop medication suddenly due to risk of withdrawal.  Breast Cancer On Tamoxifen, requesting refill. No new symptoms or concerns. -Refill Tamoxifen prescription.  Weight Gain Patient recently started on Ozempic for weight management, currently on loading dose. -Continue Ozempic, increase to 0.5mg  as planned. -Advise patient to monitor for constipation and manage accordingly.  General Health Maintenance -Recent mammogram with no reported abnormalities. -Recent MRI of back pending results. -Plan for knee replacement in November. -Follow-up in 6 months, or sooner if issues with Effexor.

## 2023-08-28 ENCOUNTER — Other Ambulatory Visit (HOSPITAL_COMMUNITY): Payer: Self-pay

## 2023-08-30 ENCOUNTER — Other Ambulatory Visit (HOSPITAL_COMMUNITY): Payer: Self-pay

## 2023-09-12 DIAGNOSIS — M48062 Spinal stenosis, lumbar region with neurogenic claudication: Secondary | ICD-10-CM | POA: Diagnosis not present

## 2023-09-12 DIAGNOSIS — M4316 Spondylolisthesis, lumbar region: Secondary | ICD-10-CM | POA: Diagnosis not present

## 2023-09-12 DIAGNOSIS — Z6835 Body mass index (BMI) 35.0-35.9, adult: Secondary | ICD-10-CM | POA: Diagnosis not present

## 2023-09-12 DIAGNOSIS — M51369 Other intervertebral disc degeneration, lumbar region without mention of lumbar back pain or lower extremity pain: Secondary | ICD-10-CM | POA: Diagnosis not present

## 2023-09-16 ENCOUNTER — Telehealth: Payer: Self-pay | Admitting: Orthopaedic Surgery

## 2023-09-16 NOTE — Telephone Encounter (Signed)
[employer] forms received. To [employer]. 

## 2023-09-18 ENCOUNTER — Other Ambulatory Visit (HOSPITAL_COMMUNITY): Payer: Self-pay

## 2023-09-18 MED ORDER — INFLUENZA VIRUS VACC SPLIT PF (FLUZONE) 0.5 ML IM SUSY
0.5000 mL | PREFILLED_SYRINGE | INTRAMUSCULAR | 0 refills | Status: AC
  Filled 2023-09-18: qty 0.5, 1d supply, fill #0

## 2023-09-20 ENCOUNTER — Telehealth: Payer: Self-pay | Admitting: Orthopaedic Surgery

## 2023-09-20 NOTE — Telephone Encounter (Signed)
[employer] forms received. To [employer],

## 2023-09-23 ENCOUNTER — Other Ambulatory Visit (HOSPITAL_COMMUNITY): Payer: Self-pay

## 2023-10-01 ENCOUNTER — Other Ambulatory Visit (HOSPITAL_COMMUNITY): Payer: Self-pay

## 2023-10-01 DIAGNOSIS — M5416 Radiculopathy, lumbar region: Secondary | ICD-10-CM | POA: Diagnosis not present

## 2023-10-01 DIAGNOSIS — M48062 Spinal stenosis, lumbar region with neurogenic claudication: Secondary | ICD-10-CM | POA: Diagnosis not present

## 2023-10-02 ENCOUNTER — Encounter: Payer: Self-pay | Admitting: Obstetrics & Gynecology

## 2023-10-02 ENCOUNTER — Ambulatory Visit: Payer: Commercial Managed Care - PPO | Admitting: Obstetrics & Gynecology

## 2023-10-02 ENCOUNTER — Other Ambulatory Visit (HOSPITAL_COMMUNITY): Payer: Self-pay

## 2023-10-02 VITALS — BP 121/78 | HR 83 | Ht 66.0 in | Wt 219.0 lb

## 2023-10-02 DIAGNOSIS — Z01419 Encounter for gynecological examination (general) (routine) without abnormal findings: Secondary | ICD-10-CM | POA: Diagnosis not present

## 2023-10-02 DIAGNOSIS — A6000 Herpesviral infection of urogenital system, unspecified: Secondary | ICD-10-CM | POA: Diagnosis not present

## 2023-10-02 MED ORDER — VALACYCLOVIR HCL 500 MG PO TABS
500.0000 mg | ORAL_TABLET | Freq: Every day | ORAL | 11 refills | Status: DC
  Filled 2023-10-02 – 2023-11-18 (×2): qty 30, 30d supply, fill #0
  Filled 2024-02-01: qty 30, 30d supply, fill #1
  Filled 2024-03-30: qty 30, 30d supply, fill #2
  Filled 2024-05-08: qty 30, 30d supply, fill #3
  Filled 2024-07-30: qty 30, 30d supply, fill #4
  Filled 2024-08-30: qty 30, 30d supply, fill #5
  Filled 2024-10-01: qty 30, 30d supply, fill #6

## 2023-10-02 NOTE — Progress Notes (Addendum)
GYNECOLOGY CLINIC ANNUAL PREVENTATIVE CARE ENCOUNTER NOTE  Subjective:   Vickie Lane is a 62 y.o. [phone] female here for a routine annual gynecologic exam.  Current complaints: none.   Denies abnormal vaginal bleeding, discharge, pelvic pain, problems with intercourse or other gynecologic concerns.    Gynecologic History No LMP recorded. Patient has had a hysterectomy. Contraception: status post hysterectomy Last Pap: 1997. Results were: normal Last mammogram: 07/29/23 hx of breast cancer being followed by oncologist.   Obstetric History OB History  Gravida Para Term Preterm AB Living  6 3 1 2 3 3   SAB IAB Ectopic Multiple Live Births  1 2     3     # Outcome Date GA Lbr Len/2nd Weight Sex Type Anes PTL Lv  6 Preterm 03/05/92 [redacted]w[redacted]d   M CS-LTranv   LIV  5 Preterm 07/30/90 [redacted]w[redacted]d   M CS-LTranv   LIV  4 Term 03/15/76    F Vag-Spont   LIV  3 IAB           2 IAB           1 SAB             Past Medical History:  Diagnosis Date   Allergy 1990   Arthritis 2011   "back" (11/07/2012)   Breast cancer (HCC)    Chest pain    Chronic lower back pain    Constipation    GERD (gastroesophageal reflux disease) 1991   Heart murmur    "MVP" (`11/07/2012)   History of blood transfusion 1990's   "? w/one of my deliveries" (11/07/2012)   Hyperlipidemia 2019   LDL   Hypertension 1993   Joint pain 2011   osteo arthritis   Kidney problem 2008   Lactose intolerance    Mitral valve prolapse    Seizures (HCC) 1991   "related to pregnancy; I had HELLP" (11/07/2012)- lastseizure was 1991 per pt    Sickle cell trait (HCC)    Systemic lupus erythematosus (HCC) 1992   joint pain   Type II diabetes mellitus (HCC) 08/2011   Diet and exercise.    Past Surgical History:  Procedure Laterality Date   ABDOMINAL HYSTERECTOMY  1997   BREAST LUMPECTOMY WITH RADIOACTIVE SEED LOCALIZATION Left 09/18/2022   Procedure: LEFT BREAST LUMPECTOMY WITH RADIOACTIVE SEED LOCALIZATION;  Surgeon:  Manus Rudd, MD;  Location: [facility];  Service: General;  Laterality: Left;   CARPAL TUNNEL RELEASE  2008   "right w/fracture OR" (11/07/2012)   CESAREAN SECTION  1991, 1993   COLONOSCOPY     POLYPECTOMY     POSTERIOR LUMBAR FUSION  11/07/2012   "L4-5" (11/07/2012)   TRIGGER FINGER RELEASE Right 05/04/2016   Procedure: MINOR RELEASE TRIGGER FINGER/A-1 PULLEY;  Surgeon: Dairl Ponder, MD;  Location: [facility];  Service: Orthopedics;  Laterality: Right;   WRIST FRACTURE SURGERY  2008   "right" (11/07/2012)    Current Outpatient Medications on File Prior to Visit  Medication Sig Dispense Refill   aspirin EC 81 MG tablet Take 81 mg by mouth every morning.     b complex vitamins capsule Take 1 capsule by mouth daily.     carvedilol (COREG) 6.25 MG tablet Take 1 tablet (6.25 mg total) by mouth 2 (two) times daily. 180 tablet 3   Cetirizine-Pseudoephedrine (ZYRTEC-D PO) Take 1 tablet by mouth daily as needed. For allergies     chlorthalidone (HYGROTON) 25 MG tablet Take 1 tablet (25 mg total)  by mouth daily. 90 tablet 3   Cholecalciferol (VITAMIN D3 PO) Take 125 mcg by mouth daily.     cyclobenzaprine (FLEXERIL) 10 MG tablet Take 1 tablet (10 mg) by mouth 3 times daily as needed 15 tablet 0   esomeprazole (NEXIUM) 40 MG capsule Take 40 mg by mouth as needed.     meloxicam (MOBIC) 7.5 MG tablet Take 7.5 mg by mouth daily as needed for pain.     Multiple Vitamin (MULTIVITAMIN WITH MINERALS) TABS Take 1 tablet by mouth daily.     predniSONE (STERAPRED UNI-PAK 21 TAB) 10 MG (21) TBPK tablet Take as directed 21 tablet 3   Semaglutide,0.25 or 0.5MG /DOS, (OZEMPIC, 0.25 OR 0.5 MG/DOSE,) 2 MG/3ML SOPN Inject 0.5 mg into the skin once a week. TO START WHEN 0.25 MG COMPLETED 3 mL 1   simvastatin (ZOCOR) 40 MG tablet Take 1 tablet (40 mg total) by mouth at bedtime. 90 tablet 3   spironolactone (ALDACTONE) 25 MG tablet Take 1 tablet (25 mg total) by mouth daily. 90  tablet 3   tamoxifen (NOLVADEX) 20 MG tablet Take 1 tablet (20 mg total) by mouth daily. 90 tablet 3   valsartan (DIOVAN) 320 MG tablet Take 1 tablet (320 mg total) by mouth daily. 90 tablet 3   venlafaxine XR (EFFEXOR-XR) 75 MG 24 hr capsule Take 1 capsule (75 mg total) by mouth daily with breakfast. 30 capsule 3   vitamin B-12 (CYANOCOBALAMIN) 1000 MCG tablet Take 5,000 mcg by mouth daily.     FREESTYLE LITE test strip   11   Lancets (FREESTYLE) lancets   12   No current facility-administered medications on file prior to visit.    Allergies  Allergen Reactions   Amlodipine Swelling   Amlodipine Besylate Swelling    Other reaction(s): anaphylaxis   Ciprofloxacin Anaphylaxis   Codeine Nausea Only, Other (See Comments), Anaphylaxis and Nausea And Vomiting    "dry heaves" (11/07/2012) Other reaction(s): stomach upset   Hydrocodone Nausea Only and Other (See Comments)    "dry heaves" (11/07/2012)   Levofloxacin Anaphylaxis and Rash    Other reaction(s): anaphylaxis   Ultram [Tramadol Hcl] Nausea Only and Other (See Comments)    "dry heaves" (11/07/2012)   Hydrocodone-Acetaminophen Nausea And Vomiting    Other reaction(s): stomach upset    Social History   Socioeconomic History   Marital status: Single    Spouse name: Not on file   Number of children: Not on file   Years of education: Not on file   Highest education level: Not on file  Occupational History   Occupation: Acupuncturist: [employer]  Tobacco Use   Smoking status: Never   Smokeless tobacco: Never  Vaping Use   Vaping status: Never Used  Substance and Sexual Activity   Alcohol use: Yes    Comment: 11/07/2012 "once/month might have a glass of wine"   Drug use: No   Sexual activity: Yes    Birth control/protection: Surgical  Other Topics Concern   Not on file  Social History Narrative   Not on file   Social Determinants of Health   Financial Resource Strain: Low Risk  (09/05/2022)    Overall Financial Resource Strain (CARDIA)    Difficulty of Paying Living Expenses: Not hard at all  Food Insecurity: No Food Insecurity (09/05/2022)   Hunger Vital Sign    Worried About Running Out of Food in the Last Year: Never true    Ran Out of  Food in the Last Year: Never true  Transportation Needs: No Transportation Needs (09/05/2022)   PRAPARE - Administrator, Civil Service (Medical): No    Lack of Transportation (Non-Medical): No  Physical Activity: Sufficiently Active (10/03/2021)   Exercise Vital Sign    Days of Exercise per Week: 4 days    Minutes of Exercise per Session: 50 min  Stress: Not on file  Social Connections: Not on file  Intimate Partner Violence: Not on file    Family History  Problem Relation Age of Onset   Hypertension Mother    Hypertension Father    Heart attack Father    Sudden death Father    Cancer Paternal Aunt        GYN (uterine/cervical); d. mid 33s   Heart disease Other    Hypertension Other    Colon cancer Neg Hx    Colon polyps Neg Hx    Rectal cancer Neg Hx    Stomach cancer Neg Hx    Esophageal cancer Neg Hx     The following portions of the patient's history were reviewed and updated as appropriate: allergies, current medications, past family history, past medical history, past social history, past surgical history and problem list.  Review of Systems Pertinent items are noted in HPI.   Objective:  BP 121/78   Pulse 83   Ht 5\' 6"  (1.676 m)   Wt 219 lb (99.3 kg)   BMI 35.35 kg/m  CONSTITUTIONAL: Well-developed, well-nourished female in no acute distress.  HENT:  Normocephalic, atraumatic, External right and left ear normal. Oropharynx is clear and moist EYES: Conjunctivae and EOM are normal. Pupils are equal, round, and reactive to light. No scleral icterus.  NECK: Normal range of motion, supple, no masses.  Normal thyroid.  SKIN: Skin is warm and dry. No rash noted. Not diaphoretic. No erythema. No  pallor. NEUROLGIC: Alert and oriented to person, place, and time. Normal reflexes, muscle tone coordination. No cranial nerve deficit noted. PSYCHIATRIC: Normal mood and affect. Normal behavior. Normal judgment and thought content. CARDIOVASCULAR: Normal heart rate noted, regular rhythm RESPIRATORY: Clear to auscultation bilaterally. Effort and breath sounds normal, no problems with respiration noted. ABDOMEN: Soft, normal bowel sounds, no distention noted.  No tenderness, rebound or guarding.  PELVIC: Normal appearing external genitalia;  No abnormal discharge noted. No other palpable masses,  or adnexal tenderness. MUSCULOSKELETAL: Normal range of motion. No tenderness.  No cyanosis, clubbing, or edema.   Assessment:  Annual gynecologic examination with no masses palpated.   Plan:  Will refill Valtrex rx.  Routine preventative health maintenance measures emphasized. Please refer to After Visit Summary for other counseling recommendations.    Scheryl Darter, MD Attending Obstetrician & Gynecologist Center for [employer], [facility] PREVENTATIVE CARE ENCOUNTER NOTE

## 2023-10-02 NOTE — Progress Notes (Signed)
GYNECOLOGY CLINIC ANNUAL PREVENTATIVE CARE ENCOUNTER NOTE  Subjective:   Vickie Lane is a 62 y.o. [phone] female here for a routine annual gynecologic exam.  Current complaints: none.   Denies abnormal vaginal bleeding, discharge, pelvic pain, problems with intercourse or other gynecologic concerns.    Gynecologic History No LMP recorded. Patient has had a hysterectomy. Contraception: status post hysterectomy Last Pap: 1997. Results were: normal Last mammogram: 07/29/23 hx of breast cancer being followed by oncologist.   Obstetric History OB History  Gravida Para Term Preterm AB Living  6 3 1 2 3 3   SAB IAB Ectopic Multiple Live Births  1 2     3     # Outcome Date GA Lbr Len/2nd Weight Sex Type Anes PTL Lv  6 Preterm 03/05/92 [redacted]w[redacted]d   M CS-LTranv   LIV  5 Preterm 07/30/90 [redacted]w[redacted]d   M CS-LTranv   LIV  4 Term 03/15/76    F Vag-Spont   LIV  3 IAB           2 IAB           1 SAB             Past Medical History:  Diagnosis Date   Allergy 1990   Arthritis 2011   "back" (11/07/2012)   Breast cancer (HCC)    Chest pain    Chronic lower back pain    Constipation    GERD (gastroesophageal reflux disease) 1991   Heart murmur    "MVP" (`11/07/2012)   History of blood transfusion 1990's   "? w/one of my deliveries" (11/07/2012)   Hyperlipidemia 2019   LDL   Hypertension 1993   Joint pain 2011   osteo arthritis   Kidney problem 2008   Lactose intolerance    Mitral valve prolapse    Seizures (HCC) 1991   "related to pregnancy; I had HELLP" (11/07/2012)- lastseizure was 1991 per pt    Sickle cell trait (HCC)    Systemic lupus erythematosus (HCC) 1992   joint pain   Type II diabetes mellitus (HCC) 08/2011   Diet and exercise.    Past Surgical History:  Procedure Laterality Date   ABDOMINAL HYSTERECTOMY  1997   BREAST LUMPECTOMY WITH RADIOACTIVE SEED LOCALIZATION Left 09/18/2022   Procedure: LEFT BREAST LUMPECTOMY WITH RADIOACTIVE SEED LOCALIZATION;  Surgeon:  Manus Rudd, MD;  Location: [facility];  Service: General;  Laterality: Left;   CARPAL TUNNEL RELEASE  2008   "right w/fracture OR" (11/07/2012)   CESAREAN SECTION  1991, 1993   COLONOSCOPY     POLYPECTOMY     POSTERIOR LUMBAR FUSION  11/07/2012   "L4-5" (11/07/2012)   TRIGGER FINGER RELEASE Right 05/04/2016   Procedure: MINOR RELEASE TRIGGER FINGER/A-1 PULLEY;  Surgeon: Dairl Ponder, MD;  Location: [facility];  Service: Orthopedics;  Laterality: Right;   WRIST FRACTURE SURGERY  2008   "right" (11/07/2012)    Current Outpatient Medications on File Prior to Visit  Medication Sig Dispense Refill   aspirin EC 81 MG tablet Take 81 mg by mouth every morning.     b complex vitamins capsule Take 1 capsule by mouth daily.     carvedilol (COREG) 6.25 MG tablet Take 1 tablet (6.25 mg total) by mouth 2 (two) times daily. 180 tablet 3   Cetirizine-Pseudoephedrine (ZYRTEC-D PO) Take 1 tablet by mouth daily as needed. For allergies     chlorthalidone (HYGROTON) 25 MG tablet Take 1 tablet (25 mg total)  by mouth daily. 90 tablet 3   Cholecalciferol (VITAMIN D3 PO) Take 125 mcg by mouth daily.     cyclobenzaprine (FLEXERIL) 10 MG tablet Take 1 tablet (10 mg) by mouth 3 times daily as needed 15 tablet 0   esomeprazole (NEXIUM) 40 MG capsule Take 40 mg by mouth as needed.     meloxicam (MOBIC) 7.5 MG tablet Take 7.5 mg by mouth daily as needed for pain.     Multiple Vitamin (MULTIVITAMIN WITH MINERALS) TABS Take 1 tablet by mouth daily.     predniSONE (STERAPRED UNI-PAK 21 TAB) 10 MG (21) TBPK tablet Take as directed 21 tablet 3   Semaglutide,0.25 or 0.5MG /DOS, (OZEMPIC, 0.25 OR 0.5 MG/DOSE,) 2 MG/3ML SOPN Inject 0.5 mg into the skin once a week. TO START WHEN 0.25 MG COMPLETED 3 mL 1   simvastatin (ZOCOR) 40 MG tablet Take 1 tablet (40 mg total) by mouth at bedtime. 90 tablet 3   spironolactone (ALDACTONE) 25 MG tablet Take 1 tablet (25 mg total) by mouth daily. 90  tablet 3   tamoxifen (NOLVADEX) 20 MG tablet Take 1 tablet (20 mg total) by mouth daily. 90 tablet 3   valsartan (DIOVAN) 320 MG tablet Take 1 tablet (320 mg total) by mouth daily. 90 tablet 3   venlafaxine XR (EFFEXOR-XR) 75 MG 24 hr capsule Take 1 capsule (75 mg total) by mouth daily with breakfast. 30 capsule 3   vitamin B-12 (CYANOCOBALAMIN) 1000 MCG tablet Take 5,000 mcg by mouth daily.     FREESTYLE LITE test strip   11   Lancets (FREESTYLE) lancets   12   No current facility-administered medications on file prior to visit.    Allergies  Allergen Reactions   Amlodipine Swelling   Amlodipine Besylate Swelling    Other reaction(s): anaphylaxis   Ciprofloxacin Anaphylaxis   Codeine Nausea Only, Other (See Comments), Anaphylaxis and Nausea And Vomiting    "dry heaves" (11/07/2012) Other reaction(s): stomach upset   Hydrocodone Nausea Only and Other (See Comments)    "dry heaves" (11/07/2012)   Levofloxacin Anaphylaxis and Rash    Other reaction(s): anaphylaxis   Ultram [Tramadol Hcl] Nausea Only and Other (See Comments)    "dry heaves" (11/07/2012)   Hydrocodone-Acetaminophen Nausea And Vomiting    Other reaction(s): stomach upset    Social History   Socioeconomic History   Marital status: Single    Spouse name: Not on file   Number of children: Not on file   Years of education: Not on file   Highest education level: Not on file  Occupational History   Occupation: Acupuncturist: [employer]  Tobacco Use   Smoking status: Never   Smokeless tobacco: Never  Vaping Use   Vaping status: Never Used  Substance and Sexual Activity   Alcohol use: Yes    Comment: 11/07/2012 "once/month might have a glass of wine"   Drug use: No   Sexual activity: Yes    Birth control/protection: Surgical  Other Topics Concern   Not on file  Social History Narrative   Not on file   Social Determinants of Health   Financial Resource Strain: Low Risk  (09/05/2022)    Overall Financial Resource Strain (CARDIA)    Difficulty of Paying Living Expenses: Not hard at all  Food Insecurity: No Food Insecurity (09/05/2022)   Hunger Vital Sign    Worried About Running Out of Food in the Last Year: Never true    Ran Out of  Food in the Last Year: Never true  Transportation Needs: No Transportation Needs (09/05/2022)   PRAPARE - Administrator, Civil Service (Medical): No    Lack of Transportation (Non-Medical): No  Physical Activity: Sufficiently Active (10/03/2021)   Exercise Vital Sign    Days of Exercise per Week: 4 days    Minutes of Exercise per Session: 50 min  Stress: Not on file  Social Connections: Not on file  Intimate Partner Violence: Not on file    Family History  Problem Relation Age of Onset   Hypertension Mother    Hypertension Father    Heart attack Father    Sudden death Father    Cancer Paternal Aunt        GYN (uterine/cervical); d. mid 33s   Heart disease Other    Hypertension Other    Colon cancer Neg Hx    Colon polyps Neg Hx    Rectal cancer Neg Hx    Stomach cancer Neg Hx    Esophageal cancer Neg Hx     The following portions of the patient's history were reviewed and updated as appropriate: allergies, current medications, past family history, past medical history, past social history, past surgical history and problem list.  Review of Systems Pertinent items are noted in HPI.   Objective:  BP 121/78   Pulse 83   Ht 5\' 6"  (1.676 m)   Wt 219 lb (99.3 kg)   BMI 35.35 kg/m  CONSTITUTIONAL: Well-developed, well-nourished female in no acute distress.  HENT:  Normocephalic, atraumatic, External right and left ear normal. Oropharynx is clear and moist EYES: Conjunctivae and EOM are normal. Pupils are equal, round, and reactive to light. No scleral icterus.  NECK: Normal range of motion, supple, no masses.  Normal thyroid.  SKIN: Skin is warm and dry. No rash noted. Not diaphoretic. No erythema. No  pallor. NEUROLGIC: Alert and oriented to person, place, and time. Normal reflexes, muscle tone coordination. No cranial nerve deficit noted. PSYCHIATRIC: Normal mood and affect. Normal behavior. Normal judgment and thought content. CARDIOVASCULAR: Normal heart rate noted, regular rhythm RESPIRATORY: Clear to auscultation bilaterally. Effort and breath sounds normal, no problems with respiration noted. ABDOMEN: Soft, normal bowel sounds, no distention noted.  No tenderness, rebound or guarding.  PELVIC: Normal appearing external genitalia;  No abnormal discharge noted. No other palpable masses,  or adnexal tenderness. MUSCULOSKELETAL: Normal range of motion. No tenderness.  No cyanosis, clubbing, or edema.   Assessment:  Annual gynecologic examination with no masses palpated.   Plan:  Will refill Valtrex rx.  Routine preventative health maintenance measures emphasized. Please refer to After Visit Summary for other counseling recommendations.    Scheryl Darter, MD Attending Obstetrician & Gynecologist Center for [employer], [facility] PREVENTATIVE CARE ENCOUNTER NOTE

## 2023-10-02 NOTE — Progress Notes (Signed)
Pt in office for AEX - needs refill on Valtrex.

## 2023-10-04 ENCOUNTER — Encounter: Payer: Self-pay | Admitting: Orthopaedic Surgery

## 2023-10-07 ENCOUNTER — Other Ambulatory Visit: Payer: Self-pay | Admitting: Physician Assistant

## 2023-10-07 ENCOUNTER — Other Ambulatory Visit (HOSPITAL_COMMUNITY): Payer: Self-pay

## 2023-10-07 ENCOUNTER — Other Ambulatory Visit: Payer: Self-pay

## 2023-10-07 MED ORDER — RIVAROXABAN 10 MG PO TABS
10.0000 mg | ORAL_TABLET | Freq: Every day | ORAL | 0 refills | Status: DC
  Filled 2023-10-07: qty 30, 30d supply, fill #0

## 2023-10-07 MED ORDER — DOCUSATE SODIUM 100 MG PO CAPS
100.0000 mg | ORAL_CAPSULE | Freq: Every day | ORAL | 2 refills | Status: AC | PRN
Start: 2023-10-07 — End: ?
  Filled 2023-10-07: qty 30, 30d supply, fill #0

## 2023-10-07 MED ORDER — DOXYCYCLINE HYCLATE 100 MG PO CAPS
100.0000 mg | ORAL_CAPSULE | Freq: Two times a day (BID) | ORAL | 0 refills | Status: DC
  Filled 2023-10-07: qty 20, 10d supply, fill #0

## 2023-10-07 MED ORDER — ONDANSETRON HCL 4 MG PO TABS
4.0000 mg | ORAL_TABLET | Freq: Three times a day (TID) | ORAL | 0 refills | Status: AC | PRN
  Filled 2023-10-07 – 2024-04-10 (×2): qty 40, 14d supply, fill #0

## 2023-10-07 MED ORDER — METHOCARBAMOL 750 MG PO TABS
750.0000 mg | ORAL_TABLET | Freq: Two times a day (BID) | ORAL | 2 refills | Status: AC | PRN
  Filled 2023-10-07: qty 20, 10d supply, fill #0
  Filled 2024-10-01: qty 20, 10d supply, fill #1

## 2023-10-07 MED ORDER — OXYCODONE-ACETAMINOPHEN 5-325 MG PO TABS
1.0000 | ORAL_TABLET | Freq: Four times a day (QID) | ORAL | 0 refills | Status: AC | PRN
Start: 2023-10-07 — End: ?
  Filled 2023-10-07: qty 40, 5d supply, fill #0

## 2023-10-11 ENCOUNTER — Other Ambulatory Visit (HOSPITAL_COMMUNITY): Payer: Self-pay

## 2023-10-14 ENCOUNTER — Ambulatory Visit (HOSPITAL_COMMUNITY)
Admission: RE | Admit: 2023-10-14 | Payer: Commercial Managed Care - PPO | Source: Home / Self Care | Admitting: Orthopaedic Surgery

## 2023-10-14 ENCOUNTER — Encounter (HOSPITAL_COMMUNITY): Admission: RE | Payer: Self-pay | Source: Home / Self Care

## 2023-10-14 DIAGNOSIS — M1712 Unilateral primary osteoarthritis, left knee: Secondary | ICD-10-CM

## 2023-10-14 SURGERY — ARTHROPLASTY, KNEE, TOTAL
Anesthesia: Spinal | Site: Knee | Laterality: Left

## 2023-10-16 ENCOUNTER — Other Ambulatory Visit (HOSPITAL_COMMUNITY): Payer: Self-pay

## 2023-10-19 ENCOUNTER — Other Ambulatory Visit (HOSPITAL_COMMUNITY): Payer: Self-pay

## 2023-10-29 ENCOUNTER — Encounter: Payer: Commercial Managed Care - PPO | Admitting: Physician Assistant

## 2023-10-30 ENCOUNTER — Encounter (HOSPITAL_BASED_OUTPATIENT_CLINIC_OR_DEPARTMENT_OTHER): Payer: Self-pay

## 2023-10-30 DIAGNOSIS — I2510 Atherosclerotic heart disease of native coronary artery without angina pectoris: Secondary | ICD-10-CM

## 2023-10-30 DIAGNOSIS — E1165 Type 2 diabetes mellitus with hyperglycemia: Secondary | ICD-10-CM

## 2023-10-30 DIAGNOSIS — E6609 Other obesity due to excess calories: Secondary | ICD-10-CM

## 2023-10-31 ENCOUNTER — Telehealth: Payer: Self-pay | Admitting: Pharmacy Technician

## 2023-10-31 ENCOUNTER — Other Ambulatory Visit (HOSPITAL_COMMUNITY): Payer: Self-pay

## 2023-10-31 MED ORDER — SEMAGLUTIDE (1 MG/DOSE) 4 MG/3ML ~~LOC~~ SOPN
1.0000 mg | PEN_INJECTOR | SUBCUTANEOUS | 1 refills | Status: DC
Start: 2023-10-31 — End: 2023-11-11
  Filled 2023-10-31: qty 3, 28d supply, fill #0

## 2023-10-31 NOTE — Telephone Encounter (Signed)
Pharmacy Patient Advocate Encounter   Received notification from Patient Advice Request messages that prior authorization for ozempic is required/requested.   Insurance verification completed.   The patient is insured through [facility] .   Per test claim: Refill too soon. PA is not needed at this time. Medication was filled 10/31/23. Next eligible fill date is 11/23/23.

## 2023-11-01 ENCOUNTER — Other Ambulatory Visit (HOSPITAL_COMMUNITY): Payer: Self-pay

## 2023-11-11 ENCOUNTER — Other Ambulatory Visit (HOSPITAL_COMMUNITY): Payer: Self-pay

## 2023-11-11 ENCOUNTER — Encounter (HOSPITAL_BASED_OUTPATIENT_CLINIC_OR_DEPARTMENT_OTHER): Payer: Self-pay | Admitting: Family

## 2023-11-11 ENCOUNTER — Ambulatory Visit (HOSPITAL_BASED_OUTPATIENT_CLINIC_OR_DEPARTMENT_OTHER): Payer: Commercial Managed Care - PPO | Admitting: Family

## 2023-11-11 ENCOUNTER — Other Ambulatory Visit (HOSPITAL_BASED_OUTPATIENT_CLINIC_OR_DEPARTMENT_OTHER): Payer: Self-pay | Admitting: Family

## 2023-11-11 VITALS — BP 112/78 | HR 88 | Ht 66.0 in | Wt 211.5 lb

## 2023-11-11 DIAGNOSIS — E1165 Type 2 diabetes mellitus with hyperglycemia: Secondary | ICD-10-CM

## 2023-11-11 DIAGNOSIS — I10 Essential (primary) hypertension: Secondary | ICD-10-CM

## 2023-11-11 DIAGNOSIS — Z6834 Body mass index (BMI) 34.0-34.9, adult: Secondary | ICD-10-CM | POA: Diagnosis not present

## 2023-11-11 MED ORDER — SEMAGLUTIDE (1 MG/DOSE) 4 MG/3ML ~~LOC~~ SOPN
2.0000 mg | PEN_INJECTOR | SUBCUTANEOUS | 1 refills | Status: DC
  Filled 2023-11-11: qty 3, 14d supply, fill #0

## 2023-11-11 MED ORDER — CHLORTHALIDONE 25 MG PO TABS
12.5000 mg | ORAL_TABLET | Freq: Every day | ORAL | 3 refills | Status: DC
  Filled 2023-11-11 – 2023-11-29 (×2): qty 45, 90d supply, fill #0
  Filled 2024-02-23: qty 45, 90d supply, fill #1
  Filled 2024-06-06: qty 45, 90d supply, fill #2
  Filled 2024-09-03: qty 45, 90d supply, fill #3

## 2023-11-11 MED ORDER — OZEMPIC (2 MG/DOSE) 8 MG/3ML ~~LOC~~ SOPN
2.0000 mg | PEN_INJECTOR | SUBCUTANEOUS | 11 refills | Status: DC
  Filled 2023-11-11 – 2023-12-03 (×4): qty 3, 28d supply, fill #0
  Filled 2023-12-29: qty 3, 28d supply, fill #1
  Filled 2024-02-01: qty 3, 28d supply, fill #2
  Filled 2024-02-23: qty 3, 28d supply, fill #3
  Filled 2024-03-30: qty 3, 28d supply, fill #4
  Filled 2024-04-23: qty 3, 28d supply, fill #5
  Filled 2024-05-25: qty 3, 28d supply, fill #6
  Filled 2024-06-28: qty 3, 28d supply, fill #7
  Filled 2024-07-30: qty 3, 28d supply, fill #8
  Filled 2024-08-25: qty 3, 28d supply, fill #9
  Filled 2024-10-01: qty 3, 28d supply, fill #10
  Filled 2024-10-24: qty 3, 28d supply, fill #11

## 2023-11-11 NOTE — Progress Notes (Signed)
Cardiology Office Note:  .   Date:  11/11/2023  ID:  Vickie Lane, DOB [birth year], MRN [id number] PCP: Ollen Bowl, MD  [facility] Providers Cardiologist:  Chilton Si, MD    History of Present Illness: .   Vickie Lane is a 62 y.o. female  with a hx of HLD, aortic atherosclerosis, HTN, nonobstructive CAD, sickle cell trait, GERD, MVP, DM2 managed by diet and exercise.    She had cardiac CTA in 2017 with calcium score of 34 placing her in 90th percentile for age and sec matched control. She had less than 50% disease which was nonobstructive in prox LAD and D2 with <30% calcific disease in proximal and mid RCA. Echocardiogram 08/2018 normal LVEF 55-60% and no valvular abnormality.    Noted to have elevated blood pressure since she was 62 years old which worsens during her pregnancies including HELLP syndrome.  Benazepril previously switched to Benicar.  Amlodipine previously discontinued due to lower extremity edema.  Olmesartan later discontinued and 320 mg of valsartan initiated.  Clonidine 0.2 mg twice daily continued.  Hydralazine later added.  Renal aldosterone levels indeterminate.  CT abdomen unremarkable though did note minimal aortic atherosclerosis.   Saw Dr. Duke Salvia of 02/07/2022 and hydralazine was stopped and transition to carvedilol 12.5 mg twice daily due to tachycardia.  Chlorthalidone, losartan, spironolactone were continued. Saw pharmacy team 03/13/22 and Carvedilol had to be reduced due to hypotension, dizziness. 08/2022 underwent lumpectomy due to breast cancer.    Last seen 07/17/23 by Dr. Duke Salvia at which time she was started on Ozempic. Prior gastric pain and nausea on Mounjaro. Exercise limited by multiple fractures to right foot 01/2023 due to mechanical fall.   She presents today for follow up. Very pleasant lady who works for [employer] for [employer]. Notes her urine and sweat "smell terrible" on the Ozempic. No  dysuria, yeast. Otherwise tolerating well with 20 pound weight loss over the last 4 months. She did get an cortison injection in her L3-L4 injection which has proved her hip/knee pain. Can get once per month per her report.  No longer planning on knee surgery.  Hopeful to start walking and biking. Right now doing some exercises including squate, hula hoop at home. She does note some lightheadedness with BP at home 120s/70s   Previous antihypertensives: Amlodipine-swelling Hydralazine - switched to Carvedilol due to tachycardia   ROS: Please see the history of present illness.    All other systems reviewed and are negative.   Studies Reviewed: .        Cardiac Studies & Procedures     STRESS TESTS  MYOCARDIAL PERFUSION IMAGING 09/17/2018  Narrative  Nuclear stress EF: 55%.  There was no ST segment deviation noted during stress.  The study is normal.  This is a low risk study.  The left ventricular ejection fraction is normal (55-65%).  Normal perfusion EF 55% Baseline ECG with inferolateral T wave changes deemed non diagnostic during stress  ECHOCARDIOGRAM  ECHOCARDIOGRAM COMPLETE 08/16/2021  Narrative ECHOCARDIOGRAM REPORT    Patient Name:   Vickie Lane [facility] Date of Exam: 08/16/2021 Medical Rec #:  [id number]                   Height:       66.0 in Accession #:    [id number]                  Weight:       214.6  lb Date of Birth:  [birth year]                    BSA:          2.061 m Patient Age:    60 years                    BP:           186/120 mmHg Patient Gender: F                           HR:           61 bpm. Exam Location:  Outpatient  Procedure: 2D Echo, Cardiac Doppler and Color Doppler  Indications:    R06.9 DOE; R06.02 SOB  History:        Patient has prior history of Echocardiogram examinations, most recent 09/17/2018. Mitral Valve Prolapse, Signs/Symptoms:Dyspnea and Murmur; Risk Factors:Non-Smoker, Dyslipidemia, Hypertension and  Diabetes.  Sonographer:    Dondra Prader RVT Sonographer#2:  Jeryl Columbia RDCS Referring Phys: [id number] Lariah Fleer S Azlee Monforte  IMPRESSIONS   1. Left ventricular ejection fraction, by estimation, is 55 to 60%. The left ventricle has normal function. The left ventricle has no regional wall motion abnormalities. Left ventricular diastolic parameters are indeterminate. 2. Right ventricular systolic function is normal. The right ventricular size is normal. There is normal pulmonary artery systolic pressure. The estimated right ventricular systolic pressure is 32.8 mmHg. 3. The mitral valve is grossly normal. Trivial mitral valve regurgitation. No evidence of mitral stenosis. 4. The aortic valve is tricuspid. Aortic valve regurgitation is not visualized. No aortic stenosis is present. 5. The inferior vena cava is normal in size with greater than 50% respiratory variability, suggesting right atrial pressure of 3 mmHg.  Comparison(s): No significant change from prior study.  FINDINGS Left Ventricle: Left ventricular ejection fraction, by estimation, is 55 to 60%. The left ventricle has normal function. The left ventricle has no regional wall motion abnormalities. The left ventricular internal cavity size was normal in size. There is no left ventricular hypertrophy. Left ventricular diastolic parameters are indeterminate.  Right Ventricle: The right ventricular size is normal. No increase in right ventricular wall thickness. Right ventricular systolic function is normal. There is normal pulmonary artery systolic pressure. The tricuspid regurgitant velocity is 2.73 m/s, and with an assumed right atrial pressure of 3 mmHg, the estimated right ventricular systolic pressure is 32.8 mmHg.  Left Atrium: Left atrial size was normal in size.  Right Atrium: Right atrial size was normal in size.  Pericardium: Trivial pericardial effusion is present.  Mitral Valve: The mitral valve is grossly normal. Trivial  mitral valve regurgitation. No evidence of mitral valve stenosis.  Tricuspid Valve: The tricuspid valve is grossly normal. Tricuspid valve regurgitation is trivial. No evidence of tricuspid stenosis.  Aortic Valve: The aortic valve is tricuspid. Aortic valve regurgitation is not visualized. No aortic stenosis is present.  Pulmonic Valve: The pulmonic valve was grossly normal. Pulmonic valve regurgitation is not visualized. No evidence of pulmonic stenosis.  Aorta: The aortic root and ascending aorta are structurally normal, with no evidence of dilitation.  Venous: The inferior vena cava is normal in size with greater than 50% respiratory variability, suggesting right atrial pressure of 3 mmHg.  IAS/Shunts: The atrial septum is grossly normal.   LEFT VENTRICLE PLAX 2D LVIDd:         4.15 cm  Diastology LVIDs:  2.77 cm  LV e' medial:    6.85 cm/s LV PW:         1.60 cm  LV E/e' medial:  15.9 LV IVS:        0.81 cm  LV e' lateral:   9.68 cm/s LVOT diam:     1.90 cm  LV E/e' lateral: 11.3 LV SV:         64 LV SV Index:   31 LVOT Area:     2.84 cm   RIGHT VENTRICLE RV Basal diam:  3.07 cm RV Mid diam:    2.47 cm  LEFT ATRIUM             Index       RIGHT ATRIUM           Index LA diam:        3.50 cm 1.70 cm/m  RA Area:     20.50 cm LA Vol (A2C):   95.0 ml 46.10 ml/m RA Volume:   60.50 ml  29.36 ml/m LA Vol (A4C):   56.6 ml 27.47 ml/m LA Biplane Vol: 76.3 ml 37.03 ml/m AORTIC VALVE LVOT Vmax:   98.10 cm/s LVOT Vmean:  62.300 cm/s LVOT VTI:    0.227 m  AORTA Ao Root diam: 2.50 cm Ao Asc diam:  2.90 cm  MITRAL VALVE                TRICUSPID VALVE MV Area (PHT): 3.91 cm     TR Peak grad:   29.8 mmHg MV Decel Time: 194 msec     TR Vmax:        273.00 cm/s MV E velocity: 109.00 cm/s MV A velocity: 79.00 cm/s   SHUNTS MV E/A ratio:  1.38         Systemic VTI:  0.23 m Systemic Diam: 1.90 cm  Lennie Odor MD Electronically signed by Lennie Odor MD Signature  Date/Time: 08/16/2021/2:24:00 PM    Final    CT SCANS  CT CORONARY MORPH W/CTA COR W/SCORE 04/04/2016  Addendum 04/04/2016 10:52 AM ADDENDUM REPORT: 04/04/2016 10:49 CLINICAL DATA:  Chest pain EXAM: Cardiac CTA MEDICATIONS: Sub lingual nitro. 4mg  and lopressor 5mg  TECHNIQUE: The patient was scanned on a Philips 256 slice scanner. Gantry rotation speed was 270 msecs. Collimation was .9mm. A 100 kV prospective scan was triggered in the descending thoracic aorta at 111 HU's with 5% padding centered around 78% of the R-R interval. Average HR during the scan was 58 bpm. The 3D data set was interpreted on a dedicated work station using MPR, MIP and VRT modes. A total of 80cc of contrast was used. FINDINGS: Non-cardiac: See separate report from [facility]. No significant findings on limited lung and soft tissue windows. Calcium Score: 34 mild calcium in proximal LAD and proximal and mid RCA Aortic root normal 3.4 cm compared to descending thoracic aorta 2.3 cm Coronary Arteries: Right dominant with no anomalies LM: Normal LAD:  Less than 50% mixed plaque in proximal vessel IM  Normal D2:  Less than 50% mixed plaque in proximal vessel Circumflex: Normal OM1: Normal OM2: Normal RCA:  Less than 30% calcified disease in proximal and mid vessel PDA: Normal PLA:  Normal IMPRESSION: 1) Calcium Score 34 90th percentile for age and sex matched controls 2) Less than 50% calcific non obstructive disease in proximal LAD and D2 with less than 30% calcific disease in proximal and mid RCA 3) Normal aortic root Charlton Haws Electronically Signed By: Theron Arista  Eden Emms M.D. On: 04/04/2016 10:49  Narrative EXAM: OVER-READ INTERPRETATION  CT CHEST  The following report is an over-read performed by radiologist Dr. Royal [facility] on 04/04/2016. This over-read does not include interpretation of cardiac or coronary anatomy or pathology. The coronary calcium  score/coronary CTA interpretation by the cardiologist is attached.  COMPARISON:  None.  FINDINGS: Within the visualized portions of the thorax there are no suspicious appearing pulmonary nodules or masses, there is no acute consolidative airspace disease, no pleural effusions, no pneumothorax, and no lymphadenopathy. Visualized portions of the upper abdomen are unremarkable. There are no aggressive appearing lytic or blastic lesions noted in the visualized portions of the skeleton.  IMPRESSION: 1. No significant incidental noncardiac findings are noted.  Electronically Signed: By: Trudie Reed M.D. On: 04/04/2016 10:09          Risk Assessment/Calculations:             Physical Exam:   VS:  BP 112/78   Pulse 88   Ht 5\' 6"  (1.676 m)   Wt 211 lb 8 oz (95.9 kg)   SpO2 98%   BMI 34.14 kg/m    Wt Readings from Last 3 Encounters:  11/11/23 211 lb 8 oz (95.9 kg)  10/02/23 219 lb (99.3 kg)  08/22/23 226 lb 6.4 oz (102.7 kg)    GEN: Well nourished, well developed in no acute distress NECK: No JVD; No carotid bruits CARDIAC: RRR, no murmurs, rubs, gallops RESPIRATORY:  Clear to auscultation without rales, wheezing or rhonchi  ABDOMEN: Soft, non-tender, non-distended EXTREMITIES:  No edema; No deformity   ASSESSMENT AND PLAN: .    Nonobstructive CAD / HLD, LDL goal <70 - Stable with no anginal symptoms. No indication for ischemic evaluation.  GDMT includes aspirin, carvedilol, simvastatin. Recommend aiming for 150 minutes of moderate intensity activity per week and following a heart healthy diet.    DM2 -congratulated on successful 20 pound weight loss on Ozempic.  Will plan to increase from 1 mg to 2 mg when she has completed 4 doses of 1 mg.  HTN - Now with relative hypotension and lightheadedness. Reduce Chlorthalidone from2 5mg  to 12.5mg  daily. Continue Spironolactone 25mg  daily and Valsartan 320mg  daily.  MyChart message to check in in 2 weeks.  If BP routinely  controlled consider discontinuation of chlorthalidone at that time.       Dispo: follow up 03/2024 with Dr. Duke Salvia  Signed, Alver Sorrow, NP

## 2023-11-11 NOTE — Patient Instructions (Addendum)
Medication Instructions:  Your physician has recommended you make the following change in your medication:   START Chlorthalidone 12.5 mg daily START Ozempic 2mg  once a week  *If you need a refill on your cardiac medications before your next appointment, please call your pharmacy*   Follow-Up: At [facility], you and your health needs are our priority.  As part of our continuing mission to provide you with exceptional heart care, we have created designated Provider Care Teams.  These Care Teams include your primary Cardiologist (physician) and Advanced Practice Providers (APPs -  Physician Assistants and Nurse Practitioners) who all work together to provide you with the care you need, when you need it.  We recommend signing up for the patient portal called "MyChart".  Sign up information is provided on this After Visit Summary.  MyChart is used to connect with patients for Virtual Visits (Telemedicine).  Patients are able to view lab/test results, encounter notes, upcoming appointments, etc.  Non-urgent messages can be sent to your provider as well.   To learn more about what you can do with MyChart, go to [email or web address].    Your next appointment:   May 2025  Provider:   Chilton Si, MD

## 2023-11-16 ENCOUNTER — Other Ambulatory Visit (HOSPITAL_COMMUNITY): Payer: Self-pay

## 2023-11-18 ENCOUNTER — Other Ambulatory Visit: Payer: Self-pay | Admitting: Hematology and Oncology

## 2023-11-19 ENCOUNTER — Other Ambulatory Visit: Payer: Self-pay

## 2023-11-19 ENCOUNTER — Other Ambulatory Visit (HOSPITAL_COMMUNITY): Payer: Self-pay

## 2023-11-19 MED ORDER — TAMOXIFEN CITRATE 20 MG PO TABS
20.0000 mg | ORAL_TABLET | Freq: Every day | ORAL | 3 refills | Status: DC
  Filled 2023-11-19: qty 90, 90d supply, fill #0
  Filled 2024-02-17: qty 90, 90d supply, fill #1

## 2023-11-21 ENCOUNTER — Other Ambulatory Visit (HOSPITAL_COMMUNITY): Payer: Self-pay

## 2023-11-23 ENCOUNTER — Other Ambulatory Visit (HOSPITAL_COMMUNITY): Payer: Self-pay

## 2023-11-25 ENCOUNTER — Encounter (HOSPITAL_BASED_OUTPATIENT_CLINIC_OR_DEPARTMENT_OTHER): Payer: Self-pay

## 2023-11-26 DIAGNOSIS — M51369 Other intervertebral disc degeneration, lumbar region without mention of lumbar back pain or lower extremity pain: Secondary | ICD-10-CM | POA: Diagnosis not present

## 2023-11-26 DIAGNOSIS — M4316 Spondylolisthesis, lumbar region: Secondary | ICD-10-CM | POA: Diagnosis not present

## 2023-11-26 DIAGNOSIS — Z6835 Body mass index (BMI) 35.0-35.9, adult: Secondary | ICD-10-CM | POA: Diagnosis not present

## 2023-11-29 ENCOUNTER — Other Ambulatory Visit (HOSPITAL_COMMUNITY): Payer: Self-pay

## 2023-12-03 ENCOUNTER — Other Ambulatory Visit (HOSPITAL_COMMUNITY): Payer: Self-pay

## 2023-12-18 ENCOUNTER — Other Ambulatory Visit: Payer: Self-pay | Admitting: Hematology and Oncology

## 2023-12-19 ENCOUNTER — Other Ambulatory Visit (HOSPITAL_COMMUNITY): Payer: Self-pay

## 2023-12-19 ENCOUNTER — Other Ambulatory Visit: Payer: Self-pay

## 2023-12-19 MED ORDER — VENLAFAXINE HCL ER 75 MG PO CP24
75.0000 mg | ORAL_CAPSULE | Freq: Every day | ORAL | 3 refills | Status: DC
  Filled 2023-12-19: qty 30, 30d supply, fill #0
  Filled 2024-01-17: qty 30, 30d supply, fill #1
  Filled 2024-02-15: qty 30, 30d supply, fill #2

## 2023-12-29 ENCOUNTER — Other Ambulatory Visit (HOSPITAL_BASED_OUTPATIENT_CLINIC_OR_DEPARTMENT_OTHER): Payer: Self-pay | Admitting: Family

## 2023-12-29 DIAGNOSIS — I25118 Atherosclerotic heart disease of native coronary artery with other forms of angina pectoris: Secondary | ICD-10-CM

## 2023-12-29 DIAGNOSIS — E782 Mixed hyperlipidemia: Secondary | ICD-10-CM

## 2023-12-30 ENCOUNTER — Other Ambulatory Visit (HOSPITAL_COMMUNITY): Payer: Self-pay

## 2023-12-30 ENCOUNTER — Other Ambulatory Visit: Payer: Self-pay

## 2023-12-30 MED ORDER — SIMVASTATIN 40 MG PO TABS
40.0000 mg | ORAL_TABLET | Freq: Every day | ORAL | 3 refills | Status: AC
Start: 2023-12-30 — End: 2024-12-30
  Filled 2023-12-30: qty 90, 90d supply, fill #0
  Filled 2024-03-30: qty 90, 90d supply, fill #1
  Filled 2024-06-28: qty 90, 90d supply, fill #2
  Filled 2024-10-01: qty 90, 90d supply, fill #3

## 2024-01-02 DIAGNOSIS — M5416 Radiculopathy, lumbar region: Secondary | ICD-10-CM | POA: Diagnosis not present

## 2024-01-11 ENCOUNTER — Other Ambulatory Visit (HOSPITAL_COMMUNITY): Payer: Self-pay

## 2024-01-24 ENCOUNTER — Other Ambulatory Visit (HOSPITAL_COMMUNITY): Payer: Self-pay

## 2024-01-24 DIAGNOSIS — M329 Systemic lupus erythematosus, unspecified: Secondary | ICD-10-CM | POA: Diagnosis not present

## 2024-01-24 DIAGNOSIS — B009 Herpesviral infection, unspecified: Secondary | ICD-10-CM | POA: Diagnosis not present

## 2024-01-24 DIAGNOSIS — E7800 Pure hypercholesterolemia, unspecified: Secondary | ICD-10-CM | POA: Diagnosis not present

## 2024-01-24 DIAGNOSIS — D0512 Intraductal carcinoma in situ of left breast: Secondary | ICD-10-CM | POA: Diagnosis not present

## 2024-01-24 DIAGNOSIS — I129 Hypertensive chronic kidney disease with stage 1 through stage 4 chronic kidney disease, or unspecified chronic kidney disease: Secondary | ICD-10-CM | POA: Diagnosis not present

## 2024-01-24 DIAGNOSIS — I2510 Atherosclerotic heart disease of native coronary artery without angina pectoris: Secondary | ICD-10-CM | POA: Diagnosis not present

## 2024-01-24 DIAGNOSIS — I700 Atherosclerosis of aorta: Secondary | ICD-10-CM | POA: Diagnosis not present

## 2024-01-24 DIAGNOSIS — M48061 Spinal stenosis, lumbar region without neurogenic claudication: Secondary | ICD-10-CM | POA: Diagnosis not present

## 2024-01-24 DIAGNOSIS — K219 Gastro-esophageal reflux disease without esophagitis: Secondary | ICD-10-CM | POA: Diagnosis not present

## 2024-01-24 DIAGNOSIS — E1121 Type 2 diabetes mellitus with diabetic nephropathy: Secondary | ICD-10-CM | POA: Diagnosis not present

## 2024-01-24 MED ORDER — CYCLOBENZAPRINE HCL 10 MG PO TABS
10.0000 mg | ORAL_TABLET | Freq: Every evening | ORAL | 0 refills | Status: DC | PRN
  Filled 2024-01-24: qty 30, 30d supply, fill #0

## 2024-01-27 DIAGNOSIS — Z6832 Body mass index (BMI) 32.0-32.9, adult: Secondary | ICD-10-CM | POA: Diagnosis not present

## 2024-01-27 DIAGNOSIS — M51369 Other intervertebral disc degeneration, lumbar region without mention of lumbar back pain or lower extremity pain: Secondary | ICD-10-CM | POA: Diagnosis not present

## 2024-01-28 ENCOUNTER — Other Ambulatory Visit (HOSPITAL_COMMUNITY): Payer: Self-pay

## 2024-02-05 DIAGNOSIS — M542 Cervicalgia: Secondary | ICD-10-CM | POA: Diagnosis not present

## 2024-02-12 DIAGNOSIS — M542 Cervicalgia: Secondary | ICD-10-CM | POA: Diagnosis not present

## 2024-02-15 ENCOUNTER — Telehealth: Payer: Self-pay | Admitting: Hematology and Oncology

## 2024-02-15 NOTE — Telephone Encounter (Signed)
 Left patient a vm regarding upcoming appointment

## 2024-02-17 ENCOUNTER — Other Ambulatory Visit (HOSPITAL_COMMUNITY): Payer: Self-pay

## 2024-02-19 DIAGNOSIS — M542 Cervicalgia: Secondary | ICD-10-CM | POA: Diagnosis not present

## 2024-02-24 ENCOUNTER — Inpatient Hospital Stay: Payer: Commercial Managed Care - PPO | Admitting: Hematology and Oncology

## 2024-02-24 ENCOUNTER — Other Ambulatory Visit: Payer: Self-pay

## 2024-02-25 DIAGNOSIS — M542 Cervicalgia: Secondary | ICD-10-CM | POA: Diagnosis not present

## 2024-03-05 ENCOUNTER — Inpatient Hospital Stay: Admitting: Hematology and Oncology

## 2024-03-16 ENCOUNTER — Other Ambulatory Visit (HOSPITAL_COMMUNITY): Payer: Self-pay

## 2024-03-16 ENCOUNTER — Inpatient Hospital Stay: Attending: Hematology and Oncology | Admitting: Hematology and Oncology

## 2024-03-16 ENCOUNTER — Encounter: Payer: Self-pay | Admitting: Hematology and Oncology

## 2024-03-16 VITALS — BP 142/82 | HR 75 | Temp 98.2°F | Resp 16 | Wt 194.0 lb

## 2024-03-16 DIAGNOSIS — Z79810 Long term (current) use of selective estrogen receptor modulators (SERMs): Secondary | ICD-10-CM | POA: Diagnosis not present

## 2024-03-16 DIAGNOSIS — Z79899 Other long term (current) drug therapy: Secondary | ICD-10-CM | POA: Insufficient documentation

## 2024-03-16 DIAGNOSIS — N951 Menopausal and female climacteric states: Secondary | ICD-10-CM | POA: Diagnosis not present

## 2024-03-16 DIAGNOSIS — Z170 Estrogen receptor positive status [ER+]: Secondary | ICD-10-CM | POA: Insufficient documentation

## 2024-03-16 DIAGNOSIS — D0512 Intraductal carcinoma in situ of left breast: Secondary | ICD-10-CM | POA: Insufficient documentation

## 2024-03-16 DIAGNOSIS — Z1721 Progesterone receptor positive status: Secondary | ICD-10-CM | POA: Diagnosis not present

## 2024-03-16 MED ORDER — TAMOXIFEN CITRATE 20 MG PO TABS
20.0000 mg | ORAL_TABLET | Freq: Every day | ORAL | 3 refills | Status: DC
  Filled 2024-03-16 – 2024-05-25 (×2): qty 90, 90d supply, fill #0
  Filled 2024-08-23: qty 90, 90d supply, fill #1

## 2024-03-16 MED ORDER — VENLAFAXINE HCL ER 75 MG PO CP24
75.0000 mg | ORAL_CAPSULE | Freq: Every day | ORAL | 3 refills | Status: DC
  Filled 2024-03-16: qty 30, 30d supply, fill #0
  Filled 2024-04-23: qty 30, 30d supply, fill #1
  Filled 2024-05-25: qty 30, 30d supply, fill #2
  Filled 2024-06-28: qty 30, 30d supply, fill #3

## 2024-03-16 NOTE — Progress Notes (Signed)
 [facility] CONSULT NOTE  Patient Care Team: Elester Grim, MD as PCP - General (Internal Medicine) Maudine Sos, MD as PCP - Cardiology (Cardiology) Dareen Ebbing, MD as Consulting Physician (General Surgery) Murleen Arms, [name]  I reviewed her records extensively and collaborated the history with the patient.  SUMMARY OF ONCOLOGIC HISTORY: Oncology History  Ductal carcinoma in situ (DCIS) of left breast  07/27/2022 Mammogram   Bilateral screening mammogram showed left breast calcifications warranting further evaluation.  No findings suspicious for malignancy in the right breast.  Diagnostic mammogram of the left breast confirmed 1.2 x 0.8 x 1.1 cm hypoechoic circumscribed mass in the left breast at 2:00, no hilar fat or convincing hilar blood flow.  Ultrasound showed several normal lymph nodes.  No enlarged or abnormal left axillary lymph nodes.   08/21/2022 Pathology Results   Pathology from the left breast needle core biopsy showed intermediate nuclear grade DCIS, necrosis present, calcs present, ER 95% positive strong staining PR 40% positive strong staining   09/05/2022 Cancer Staging   Staging form: Breast, AJCC 8th Edition - Clinical stage from 09/05/2022: Stage 0 (cTis (DCIS), cN0, cM0, ER+, PR+) - Signed by Murleen Arms, MD on 03/16/2024 Stage prefix: Initial diagnosis Nuclear grade: G2   09/10/2022 Genetic Testing   Negative [facility] CustomNext-Cancer +RNAinsight Panel.  Report date is 09/23/2022.    The CustomNext-Cancer+RNAinsight panel offered by [employer] includes sequencing and rearrangement analysis for the following 50 genes:  APC, ATM, BAP1, BARD1, BMPR1A, BRCA1, BRCA2, BRIP1, CDH1, CDK4,  CDKN2A, CHEK2, DICER1, MEN1, MLH1, MSH2, MSH6, MUTYH, NBN, NF1, NF2, NTHL1, PALB2, PMS2, POT1, PTEN, RAD51C, RAD51D, RECQL, RET, SDHA, SDHAF2, SDHB, SDHC, SDHD, SMAD4, SMARCA4, STK11, TP53, TSC1, TSC2 and VHL (sequencing and deletion/duplication); AXIN2, HOXB13, MITF, MSH3, POLD1 and POLE (sequencing only); EPCAM and GREM1 (deletion/duplication only). RNA data is routinely analyzed for use in variant interpretation for all genes.       09/18/2022 Surgery   DCIS, 1.5cm, no SLN removed   11/2022 -  Anti-estrogen oral therapy   Tamoxifen    She had left breast lumpectomy since last visit which showed DCIS, intermediate grade, 1.5 cm, margins are negative, prior prognostic showed ER 95% positive PR 40% positive   Discussed the use of AI scribe software for clinical note transcription with the patient, who gave verbal consent to proceed.  History of Present Illness Vickie Lane is a 63 year old female with breast cancer who presents for follow-up regarding tamoxifen  therapy and associated symptoms.  She experiences hot flashes, which she attributes to her tamoxifen  therapy. She is taking Effexor  to manage these symptoms, and notes that the hot flashes are the primary side effect she is experiencing at this time.  She has chronic lower back pain for which she is receiving injections. Her medication regimen for this condition remains unchanged.  Her September mammogram was normal. She is currently taking tamoxifen  and Effexor  and requires refills for both medications. She confirms staying hydrated and engaging in daily walking as part of her routine.  No new health changes since her last visit, except for back issues. No bleeding, breathing difficulties, or leg swelling.   Rest of the pertinent 10 point ROS reviewed and negative  MEDICAL HISTORY:  Past Medical History:  Diagnosis Date   Allergy 1990   Arthritis 2011   "back" (11/07/2012)   Breast cancer (HCC)    Chest pain     Chronic lower back pain    Constipation    GERD (gastroesophageal reflux disease) 1991   Heart murmur    "MVP" (`11/07/2012)   History of blood transfusion 1990's   "? w/one of my deliveries" (11/07/2012)   Hyperlipidemia 2019   LDL   Hypertension 1993   Joint pain 2011   osteo arthritis   Kidney problem 2008   Lactose intolerance    Mitral valve prolapse    Seizures (HCC) 1991   "related to pregnancy; I had HELLP" (11/07/2012)- lastseizure was 1991 per pt    Sickle cell trait (HCC)    Systemic lupus erythematosus (HCC) 1992   joint pain   Type II diabetes mellitus (HCC) 08/2011   Diet and exercise.    SURGICAL HISTORY: Past Surgical History:  Procedure Laterality Date   ABDOMINAL HYSTERECTOMY  1997   BREAST LUMPECTOMY WITH RADIOACTIVE SEED LOCALIZATION Left 09/18/2022   Procedure: LEFT BREAST LUMPECTOMY WITH RADIOACTIVE SEED LOCALIZATION;  Surgeon: Dareen Ebbing, MD;  Location: [facility];  Service: General;  Laterality: Left;   CARPAL TUNNEL RELEASE  2008   "right w/fracture OR" (11/07/2012)   CESAREAN SECTION  1991, 1993   COLONOSCOPY     POLYPECTOMY     POSTERIOR LUMBAR FUSION  11/07/2012   "L4-5" (11/07/2012)   TRIGGER FINGER RELEASE Right 05/04/2016   Procedure: MINOR RELEASE TRIGGER FINGER/A-1 PULLEY;  Surgeon: Florida Hurter, MD;  Location: [facility];  Service: Orthopedics;  Laterality: Right;   WRIST FRACTURE SURGERY  2008   "right" (11/07/2012)    SOCIAL HISTORY: Social History   Socioeconomic History   Marital status: Single    Spouse name: Not on file   Number of children: Not on file   Years of education: Not on file   Highest education level: Not on file  Occupational History   Occupation: Acupuncturist: [employer]  Tobacco Use   Smoking status: Never   Smokeless tobacco: Never  Vaping Use   Vaping status: Never Used  Substance and Sexual Activity   Alcohol use: Yes    Comment: 11/07/2012  "once/month might have a glass of wine"   Drug use: No   Sexual activity: Yes    Birth control/protection: Surgical  Other Topics Concern   Not on file  Social History Narrative   Not on file   Social Drivers of Health   Financial Resource Strain: Low Risk  (09/05/2022)   Overall Financial Resource Strain (CARDIA)    Difficulty of Paying Living Expenses: Not hard at all  Food Insecurity: No Food Insecurity (09/05/2022)   Hunger Vital Sign    Worried About Running Out of Food in the Last Year: Never true    Ran Out of Food in the Last Year: Never true  Transportation Needs: No Transportation Needs (09/05/2022)   PRAPARE - Administrator, Civil Service (Medical): No    Lack of Transportation (Non-Medical): No  Physical Activity: Sufficiently Active (10/03/2021)   Exercise Vital Sign    Days of Exercise per Week: 4 days    Minutes of Exercise per Session: 50 min  Stress: Not on file  Social Connections: Not on file  Intimate Partner Violence: Not on file    FAMILY HISTORY: Family History  Problem Relation Age of Onset  Hypertension Mother    Hypertension Father    Heart attack Father    Sudden death Father    Cancer Paternal Aunt        GYN (uterine/cervical); d. mid 101s   Heart disease Other    Hypertension Other    Colon cancer Neg Hx    Colon polyps Neg Hx    Rectal cancer Neg Hx    Stomach cancer Neg Hx    Esophageal cancer Neg Hx     ALLERGIES:  is allergic to amlodipine , amlodipine  besylate, ciprofloxacin, codeine, hydrocodone , levofloxacin, ultram [tramadol hcl], and hydrocodone -acetaminophen .  MEDICATIONS:  Current Outpatient Medications  Medication Sig Dispense Refill   aspirin  EC 81 MG tablet Take 81 mg by mouth every morning.     b complex vitamins capsule Take 1 capsule by mouth daily.     carvedilol  (COREG ) 6.25 MG tablet Take 1 tablet (6.25 mg total) by mouth 2 (two) times daily. 180 tablet 3   Cetirizine-Pseudoephedrine (ZYRTEC-D PO)  Take 1 tablet by mouth daily as needed. For allergies     chlorthalidone  (HYGROTON ) 25 MG tablet Take 0.5 tablets (12.5 mg total) by mouth daily. 90 tablet 3   Cholecalciferol (VITAMIN D3 PO) Take 125 mcg by mouth daily.     cyclobenzaprine  (FLEXERIL ) 10 MG tablet Take 1 tablet (10 mg) by mouth 3 times daily as needed (Patient not taking: Reported on 11/11/2023) 15 tablet 0   cyclobenzaprine  (FLEXERIL ) 10 MG tablet Take 1 tablet (10 mg total) by mouth at bedtime as needed. 30 tablet 0   docusate sodium  (COLACE) 100 MG capsule Take 1 capsule (100 mg total) by mouth daily as needed. 30 capsule 2   doxycycline  (VIBRAMYCIN ) 100 MG capsule Take 1 capsule (100 mg total) by mouth 2 (two) times daily. To be taken after surgery (Patient not taking: Reported on 11/11/2023) 20 capsule 0   esomeprazole (NEXIUM) 40 MG capsule Take 40 mg by mouth as needed.     FREESTYLE LITE test strip   11   Lancets (FREESTYLE) lancets   12   meloxicam (MOBIC) 7.5 MG tablet Take 7.5 mg by mouth daily as needed for pain. (Patient not taking: Reported on 11/11/2023)     methocarbamol  (ROBAXIN -750) 750 MG tablet Take 1 tablet (750 mg total) by mouth 2 (two) times daily as needed for muscle spasms. 20 tablet 2   Multiple Vitamin (MULTIVITAMIN WITH MINERALS) TABS Take 1 tablet by mouth daily.     ondansetron  (ZOFRAN ) 4 MG tablet Take 1 tablet (4 mg total) by mouth every 8 (eight) hours as needed for nausea or vomiting. (Patient not taking: Reported on 11/11/2023) 40 tablet 0   oxyCODONE -acetaminophen  (PERCOCET) 5-325 MG tablet Take 1-2 tablets by mouth every 6 (six) hours as needed. To be taken after surgery (Patient not taking: Reported on 11/11/2023) 40 tablet 0   predniSONE  (STERAPRED UNI-PAK 21 TAB) 10 MG (21) TBPK tablet Take as directed 21 tablet 3   rivaroxaban  (XARELTO ) 10 MG TABS tablet Take 1 tablet (10 mg total) by mouth daily. To be taken after surgery to prevent blood clots (Patient not taking: Reported on 11/11/2023) 30  tablet 0   Semaglutide , 2 MG/DOSE, (OZEMPIC , 2 MG/DOSE,) 8 MG/3ML SOPN Inject 2 mg into the skin once a week. 3 mL 11   simvastatin  (ZOCOR ) 40 MG tablet Take 1 tablet (40 mg total) by mouth at bedtime. 90 tablet 3   spironolactone  (ALDACTONE ) 25 MG tablet Take 1 tablet (25 mg total) by  mouth daily. 90 tablet 3   tamoxifen  (NOLVADEX ) 20 MG tablet Take 1 tablet (20 mg total) by mouth daily. 90 tablet 3   valACYclovir  (VALTREX ) 500 MG tablet Take 1 tablet (500 mg total) by mouth daily. 30 tablet 11   valsartan  (DIOVAN ) 320 MG tablet Take 1 tablet (320 mg total) by mouth daily. 90 tablet 3   venlafaxine  XR (EFFEXOR -XR) 75 MG 24 hr capsule Take 1 capsule (75 mg total) by mouth daily with breakfast. 30 capsule 3   vitamin B-12 (CYANOCOBALAMIN) 1000 MCG tablet Take 5,000 mcg by mouth daily.     No current facility-administered medications for this visit.    REVIEW OF SYSTEMS:   Constitutional: Denies fevers, chills or abnormal night sweats Eyes: Denies blurriness of vision, double vision or watery eyes Ears, nose, mouth, throat, and face: Denies mucositis or sore throat Respiratory: Denies cough, dyspnea or wheezes Cardiovascular: Denies palpitation, chest discomfort or lower extremity swelling Gastrointestinal:  Denies nausea, heartburn or change in bowel habits Skin: Denies abnormal skin rashes Lymphatics: Denies new lymphadenopathy or easy bruising Neurological:Denies numbness, tingling or new weaknesses Behavioral/Psych: Mood is stable, no new changes  Breast: Denies any palpable lumps or discharge All other systems were reviewed with the patient and are negative.  PHYSICAL EXAMINATION: ECOG PERFORMANCE STATUS: 0 - Asymptomatic  Vitals:   03/16/24 1310  BP: (!) 142/82  Pulse: 75  Resp: 16  Temp: 98.2 F (36.8 C)  SpO2: 99%    Filed Weights   03/16/24 1310  Weight: 194 lb (88 kg)     NECK: No cervical lymphadenopathy. BREAST: Breasts without masses, tenderness, or  asymmetry. Great cosmetic outcome. EXTREMITIES: Legs without swelling.  LABORATORY DATA:  I have reviewed the data as listed Lab Results  Component Value Date   WBC 5.1 02/14/2023   HGB 12.0 02/14/2023   HCT 35.9 (L) 02/14/2023   MCV 84.7 02/14/2023   PLT 270 02/14/2023   Lab Results  Component Value Date   NA 142 02/14/2023   K 3.6 02/14/2023   CL 107 02/14/2023   CO2 30 02/14/2023    RADIOGRAPHIC STUDIES: I have personally reviewed the radiological reports and agreed with the findings in the report.  ASSESSMENT AND PLAN:  Ductal carcinoma in situ (DCIS) of left breast This is a very pleasant 63 year old female patient with newly diagnosed left breast DCIS, ER/PR positive referred to breast [facility] for recommendations.  She is now status post left breast lumpectomy here  on adjuvant tamoxifen .   Assessment and Plan Assessment & Plan Breast cancer follow-up Post-surgical follow-up.  - No concerns on exam. - Normal mammogram in September. - Order diagnostic mammogram for September 2025. - Refill tamoxifen  prescription. - Schedule follow-up in six months.  Hot flashes Persistent hot flashes due to tamoxifen . Effexor  effective. - Continue Effexor . - Encourage hydration and daily walking.       Total time spent: 20 min including history, physical exam, review of records, counseling and coordination of care  All questions were answered. The patient knows to call the clinic with any problems, questions or concerns.    Murleen Arms, MD 03/16/24

## 2024-03-16 NOTE — Assessment & Plan Note (Addendum)
 This is a very pleasant 63 year old female patient with newly diagnosed left breast DCIS, ER/PR positive referred to breast [facility] for recommendations.  She is now status post left breast lumpectomy here  on adjuvant tamoxifen .   Assessment and Plan Assessment & Plan Breast cancer follow-up Post-surgical follow-up.  - No concerns on exam. - Normal mammogram in September. - Order diagnostic mammogram for September 2025. - Refill tamoxifen  prescription. - Schedule follow-up in six months.  Hot flashes Persistent hot flashes due to tamoxifen . Effexor  effective. - Continue Effexor . - Encourage hydration and daily walking.

## 2024-04-10 ENCOUNTER — Other Ambulatory Visit: Payer: Self-pay

## 2024-04-10 ENCOUNTER — Other Ambulatory Visit (HOSPITAL_COMMUNITY): Payer: Self-pay

## 2024-04-16 DIAGNOSIS — I129 Hypertensive chronic kidney disease with stage 1 through stage 4 chronic kidney disease, or unspecified chronic kidney disease: Secondary | ICD-10-CM | POA: Diagnosis not present

## 2024-04-16 DIAGNOSIS — N189 Chronic kidney disease, unspecified: Secondary | ICD-10-CM | POA: Diagnosis not present

## 2024-04-16 DIAGNOSIS — N2581 Secondary hyperparathyroidism of renal origin: Secondary | ICD-10-CM | POA: Diagnosis not present

## 2024-04-16 DIAGNOSIS — N1831 Chronic kidney disease, stage 3a: Secondary | ICD-10-CM | POA: Diagnosis not present

## 2024-04-16 DIAGNOSIS — D631 Anemia in chronic kidney disease: Secondary | ICD-10-CM | POA: Diagnosis not present

## 2024-04-16 LAB — LAB REPORT - SCANNED
Calcium: 10.4
Creatinine, POC: 169.4 mg/dL
EGFR: 40
PTH: 67

## 2024-04-18 DIAGNOSIS — H3553 Other dystrophies primarily involving the sensory retina: Secondary | ICD-10-CM | POA: Diagnosis not present

## 2024-04-18 DIAGNOSIS — H5203 Hypermetropia, bilateral: Secondary | ICD-10-CM | POA: Diagnosis not present

## 2024-04-18 DIAGNOSIS — H52223 Regular astigmatism, bilateral: Secondary | ICD-10-CM | POA: Diagnosis not present

## 2024-04-25 ENCOUNTER — Other Ambulatory Visit (HOSPITAL_COMMUNITY): Payer: Self-pay

## 2024-05-06 ENCOUNTER — Other Ambulatory Visit (HOSPITAL_COMMUNITY): Payer: Self-pay

## 2024-05-06 ENCOUNTER — Other Ambulatory Visit: Payer: Self-pay | Admitting: Internal Medicine

## 2024-05-06 DIAGNOSIS — M542 Cervicalgia: Secondary | ICD-10-CM | POA: Diagnosis not present

## 2024-05-06 DIAGNOSIS — M25511 Pain in right shoulder: Secondary | ICD-10-CM | POA: Diagnosis not present

## 2024-05-06 DIAGNOSIS — G8929 Other chronic pain: Secondary | ICD-10-CM | POA: Diagnosis not present

## 2024-05-06 DIAGNOSIS — M25512 Pain in left shoulder: Secondary | ICD-10-CM | POA: Diagnosis not present

## 2024-05-06 DIAGNOSIS — E1121 Type 2 diabetes mellitus with diabetic nephropathy: Secondary | ICD-10-CM | POA: Diagnosis not present

## 2024-05-06 MED ORDER — CYCLOBENZAPRINE HCL 10 MG PO TABS
10.0000 mg | ORAL_TABLET | Freq: Every evening | ORAL | 0 refills | Status: AC | PRN
  Filled 2024-05-06: qty 30, 30d supply, fill #0

## 2024-05-08 ENCOUNTER — Encounter: Payer: Self-pay | Admitting: Internal Medicine

## 2024-05-12 ENCOUNTER — Ambulatory Visit
Admission: RE | Admit: 2024-05-12 | Discharge: 2024-05-12 | Discharge status: Home / Self Care | Source: Ambulatory Visit | Attending: Internal Medicine

## 2024-05-12 DIAGNOSIS — M542 Cervicalgia: Secondary | ICD-10-CM

## 2024-05-12 DIAGNOSIS — M5412 Radiculopathy, cervical region: Secondary | ICD-10-CM | POA: Diagnosis not present

## 2024-05-12 DIAGNOSIS — M4802 Spinal stenosis, cervical region: Secondary | ICD-10-CM | POA: Diagnosis not present

## 2024-05-15 ENCOUNTER — Other Ambulatory Visit (HOSPITAL_COMMUNITY): Payer: Self-pay

## 2024-05-15 ENCOUNTER — Ambulatory Visit: Admitting: Orthopaedic Surgery

## 2024-05-15 DIAGNOSIS — M542 Cervicalgia: Secondary | ICD-10-CM | POA: Diagnosis not present

## 2024-05-15 MED ORDER — PREDNISONE 10 MG (21) PO TBPK
ORAL_TABLET | ORAL | 3 refills | Status: AC
  Filled 2024-05-15: qty 21, 6d supply, fill #0

## 2024-05-15 NOTE — Progress Notes (Signed)
 Office Visit Note   Patient: Vickie Lane           Date of Birth: Feb 03, 1961           MRN: [id number] Visit Date: 05/15/2024              Requested by: Vernon Velna SAUNDERS, MD [address] [place],  [place] [address] PCP: Vernon Velna SAUNDERS, MD   Assessment & Plan: Visit Diagnoses:  1. Neck pain     Plan: History of Present Illness Vickie Lane is a 63 year old female who presents with neck and shoulder pain.  She experiences bilateral shoulder and neck pain with radiation to the left arm, accompanied by numbness and tingling in the left pinky. The pain is intermittent, and she can perform her usual activities, though they are painful. A CT scan of the neck shows degeneration. She completed a month of physical therapy focusing on the left side, which significantly reduced her pain. She can now perform certain movements before the pain starts. No motor deficits are present, and sensation is normal in the arm and hand.  Examination of the neck and bilateral upper extremities show no focal motor or sensory deficits.  Results RADIOLOGY Neck CT: Degenerative changes of C5-6.  No acute abnormalities.  Assessment and Plan Cervical radiculopathy Chronic cervical radiculopathy with bilateral shoulder pain, neck pain, and intermittent numbness in the left pinky. CT shows bone spurs and cervical degeneration likely causing nerve irritation. No motor deficits, suitable for conservative management. - Prescribe prednisone  to reduce inflammation. - Order MRI if symptoms persist to assess soft tissues and discs. - Consider referral to Dr. Dartha for cortisone injections if MRI indicates.  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 Symptoms include pain and intermittent numbness without weakness, suitable for non-surgical management. - Continue Flexeril   and arthritis Tylenol  as needed. - Consider MRI if symptoms persist to evaluate for potential cortisone injections.  Follow-Up Instructions: No follow-ups on file.   Orders:  No orders of the defined types were placed in this encounter.  Meds ordered this encounter  Medications   predniSONE  (STERAPRED UNI-PAK 21 TAB) 10 MG (21) TBPK tablet    Sig: Take as directed    Dispense:  21 tablet    Refill:  3    Subjective: Chief Complaint  Patient presents with   Neck - Pain    HPI  Review of Systems  Constitutional: Negative.   HENT: Negative.    Eyes: Negative.   Respiratory: Negative.    Cardiovascular: Negative.   Endocrine: Negative.   Musculoskeletal: Negative.   Neurological: Negative.   Hematological: Negative.   Psychiatric/Behavioral: Negative.    All other systems reviewed and are negative.    Objective: Vital Signs: There were no vitals taken for this visit.  Physical Exam Vitals and nursing note reviewed.  Constitutional:      Appearance: She is well-developed.  HENT:     Head: Atraumatic.     Nose: Nose normal.   Eyes:     Extraocular Movements: Extraocular movements intact.    Cardiovascular:     Pulses: Normal pulses.  Pulmonary:     Effort: Pulmonary effort is normal.  Abdominal:     Palpations: Abdomen is soft.   Musculoskeletal:     Cervical back: Neck supple.   Skin:    General: Skin is warm.  Capillary Refill: Capillary refill takes less than 2 seconds.   Neurological:     Mental Status: She is alert. Mental status is at baseline.   Psychiatric:        Behavior: Behavior normal.        Thought Content: Thought content normal.        Judgment: Judgment normal.       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   Coronary artery disease 03/13/2022   Chronic kidney disease, stage 2 (mild) 03/13/2022   Allergic rhinitis due to pollen 03/13/2022   Chronic kidney disease due to hypertension 03/13/2022   Sickle cell trait (HCC) 01/31/2022   Hypertension associated with type 2 diabetes mellitus (HCC) 11/06/2021   Diabetes mellitus (HCC) 11/06/2021   Class 1 obesity due to excess calories with body mass index (BMI) of 31.0 to 31.9 in adult 09/11/2021   Carcinoma in situ 09/11/2021   Hyperlipidemia associated with type 2 diabetes mellitus (HCC) 09/11/2021   Hypertension associated with diabetes (HCC) 09/11/2021   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 w/one of my deliveries (11/07/2012)   Hyperlipidemia 2019   LDL   Hypertension 1993   Joint pain 2011   osteo arthritis   Kidney problem 2008   Lactose intolerance    Mitral valve prolapse    Seizures (HCC) 1991   related to pregnancy; I had HELLP (11/07/2012)- lastseizure was 1991 per pt    Sickle cell trait (HCC)    Systemic lupus erythematosus (HCC) 1992   joint pain   Type II diabetes mellitus (HCC) 08/2011   Diet and exercise.    Family History  Problem Relation Age of Onset   Hypertension Mother    Hypertension Father    Heart attack Father    Sudden death Father    Cancer Paternal Aunt        GYN (uterine/cervical); d. mid 1s   Heart disease Other    Hypertension Other    Colon cancer Neg Hx    Colon polyps Neg Hx    Rectal cancer Neg Hx     Stomach cancer Neg Hx    Esophageal cancer Neg Hx     Past Surgical History:  Procedure Laterality Date   ABDOMINAL HYSTERECTOMY  1997   BREAST LUMPECTOMY WITH RADIOACTIVE SEED LOCALIZATION Left 09/18/2022   Procedure: LEFT BREAST LUMPECTOMY WITH RADIOACTIVE SEED LOCALIZATION;  Surgeon: Belinda Cough, MD;  Location: [facility];  Service: General;  Laterality: Left;   CARPAL TUNNEL RELEASE  2008   right w/fracture OR (11/07/2012)   CESAREAN SECTION  1991, 1993   COLONOSCOPY     POLYPECTOMY     POSTERIOR LUMBAR FUSION  11/07/2012   L4-5 (11/07/2012)   TRIGGER FINGER RELEASE  Right 05/04/2016   Procedure: MINOR RELEASE TRIGGER FINGER/A-1 PULLEY;  Surgeon: Donnice Robinsons, MD;  Location: [facility];  Service: Orthopedics;  Laterality: Right;   WRIST FRACTURE SURGERY  2008   right (11/07/2012)   Social History   Occupational History   Occupation: Acupuncturist: [employer]  Tobacco Use   Smoking status: Never   Smokeless tobacco: Never  Vaping Use   Vaping status: Never Used  Substance and Sexual Activity   Alcohol use: Yes    Comment: 11/07/2012 once/month might have a glass of wine   Drug use: No   Sexual activity: Yes    Birth control/protection: Surgical

## 2024-05-25 ENCOUNTER — Other Ambulatory Visit (HOSPITAL_COMMUNITY): Payer: Self-pay

## 2024-06-11 DIAGNOSIS — M25512 Pain in left shoulder: Secondary | ICD-10-CM | POA: Diagnosis not present

## 2024-06-25 DIAGNOSIS — M19012 Primary osteoarthritis, left shoulder: Secondary | ICD-10-CM | POA: Diagnosis not present

## 2024-06-25 DIAGNOSIS — M47812 Spondylosis without myelopathy or radiculopathy, cervical region: Secondary | ICD-10-CM | POA: Diagnosis not present

## 2024-06-25 DIAGNOSIS — M4722 Other spondylosis with radiculopathy, cervical region: Secondary | ICD-10-CM | POA: Diagnosis not present

## 2024-06-25 DIAGNOSIS — M5021 Other cervical disc displacement,  high cervical region: Secondary | ICD-10-CM | POA: Diagnosis not present

## 2024-06-25 DIAGNOSIS — M25512 Pain in left shoulder: Secondary | ICD-10-CM | POA: Diagnosis not present

## 2024-06-25 DIAGNOSIS — M50222 Other cervical disc displacement at C5-C6 level: Secondary | ICD-10-CM | POA: Diagnosis not present

## 2024-06-25 DIAGNOSIS — M4802 Spinal stenosis, cervical region: Secondary | ICD-10-CM | POA: Diagnosis not present

## 2024-06-25 DIAGNOSIS — M7582 Other shoulder lesions, left shoulder: Secondary | ICD-10-CM | POA: Diagnosis not present

## 2024-06-25 DIAGNOSIS — M75112 Incomplete rotator cuff tear or rupture of left shoulder, not specified as traumatic: Secondary | ICD-10-CM | POA: Diagnosis not present

## 2024-07-07 ENCOUNTER — Other Ambulatory Visit (HOSPITAL_COMMUNITY): Payer: Self-pay

## 2024-07-07 DIAGNOSIS — M25512 Pain in left shoulder: Secondary | ICD-10-CM | POA: Diagnosis not present

## 2024-07-07 DIAGNOSIS — Z6829 Body mass index (BMI) 29.0-29.9, adult: Secondary | ICD-10-CM | POA: Diagnosis not present

## 2024-07-07 DIAGNOSIS — M4722 Other spondylosis with radiculopathy, cervical region: Secondary | ICD-10-CM | POA: Diagnosis not present

## 2024-07-07 MED ORDER — CYCLOBENZAPRINE HCL 10 MG PO TABS
10.0000 mg | ORAL_TABLET | Freq: Three times a day (TID) | ORAL | 1 refills | Status: AC | PRN
  Filled 2024-07-07: qty 60, 20d supply, fill #0

## 2024-07-09 ENCOUNTER — Telehealth: Payer: Self-pay | Admitting: Pharmacy Technician

## 2024-07-09 NOTE — Telephone Encounter (Signed)
   Pharmacy Patient Advocate Encounter   Received notification from CoverMyMeds that prior authorization for OZEMPIC  2MG  is required/requested.   Insurance verification completed.   The patient is insured through [facility] .   Per test claim: PA required; PA submitted to above mentioned insurance via Latent Key/confirmation #/EOC [id number] Status is pending

## 2024-07-14 DIAGNOSIS — N2581 Secondary hyperparathyroidism of renal origin: Secondary | ICD-10-CM | POA: Diagnosis not present

## 2024-07-14 DIAGNOSIS — N1831 Chronic kidney disease, stage 3a: Secondary | ICD-10-CM | POA: Diagnosis not present

## 2024-07-14 DIAGNOSIS — D631 Anemia in chronic kidney disease: Secondary | ICD-10-CM | POA: Diagnosis not present

## 2024-07-14 DIAGNOSIS — I129 Hypertensive chronic kidney disease with stage 1 through stage 4 chronic kidney disease, or unspecified chronic kidney disease: Secondary | ICD-10-CM | POA: Diagnosis not present

## 2024-07-14 NOTE — Telephone Encounter (Signed)
 Pharmacy Patient Advocate Encounter  Received notification from [facility] that Prior Authorization for ozempic  has been approved-covered benefit

## 2024-07-15 ENCOUNTER — Other Ambulatory Visit (HOSPITAL_COMMUNITY): Payer: Self-pay

## 2024-07-29 ENCOUNTER — Ambulatory Visit
Admission: RE | Admit: 2024-07-29 | Discharge: 2024-07-29 | Disposition: A | Discharge status: Home / Self Care | Source: Ambulatory Visit | Attending: Hematology and Oncology

## 2024-07-29 DIAGNOSIS — R928 Other abnormal and inconclusive findings on diagnostic imaging of breast: Secondary | ICD-10-CM | POA: Diagnosis not present

## 2024-07-29 DIAGNOSIS — M67911 Unspecified disorder of synovium and tendon, right shoulder: Secondary | ICD-10-CM | POA: Diagnosis not present

## 2024-07-29 DIAGNOSIS — M67912 Unspecified disorder of synovium and tendon, left shoulder: Secondary | ICD-10-CM | POA: Diagnosis not present

## 2024-07-29 DIAGNOSIS — R92333 Mammographic heterogeneous density, bilateral breasts: Secondary | ICD-10-CM | POA: Diagnosis not present

## 2024-07-29 DIAGNOSIS — D0512 Intraductal carcinoma in situ of left breast: Secondary | ICD-10-CM

## 2024-07-30 ENCOUNTER — Other Ambulatory Visit (HOSPITAL_COMMUNITY): Payer: Self-pay

## 2024-07-30 ENCOUNTER — Encounter: Payer: Self-pay | Admitting: Hematology and Oncology

## 2024-07-30 ENCOUNTER — Other Ambulatory Visit: Payer: Self-pay | Admitting: Hematology and Oncology

## 2024-07-30 ENCOUNTER — Other Ambulatory Visit: Payer: Self-pay

## 2024-07-30 DIAGNOSIS — E7800 Pure hypercholesterolemia, unspecified: Secondary | ICD-10-CM | POA: Diagnosis not present

## 2024-07-30 DIAGNOSIS — E1121 Type 2 diabetes mellitus with diabetic nephropathy: Secondary | ICD-10-CM | POA: Diagnosis not present

## 2024-07-30 DIAGNOSIS — N1831 Chronic kidney disease, stage 3a: Secondary | ICD-10-CM | POA: Diagnosis not present

## 2024-07-30 MED ORDER — VENLAFAXINE HCL ER 75 MG PO CP24
75.0000 mg | ORAL_CAPSULE | Freq: Every day | ORAL | 3 refills | Status: DC
  Filled 2024-07-30: qty 30, 30d supply, fill #0

## 2024-07-30 MED ORDER — VENLAFAXINE HCL ER 75 MG PO CP24
75.0000 mg | ORAL_CAPSULE | Freq: Every day | ORAL | 3 refills | Status: DC
  Filled 2024-08-30: qty 30, 30d supply, fill #0
  Filled 2024-10-01: qty 30, 30d supply, fill #1
  Filled 2024-10-27: qty 30, 30d supply, fill #2
  Filled 2024-11-25: qty 30, 30d supply, fill #3

## 2024-07-30 NOTE — Telephone Encounter (Signed)
 Per last office note approved refills

## 2024-07-31 ENCOUNTER — Other Ambulatory Visit (HOSPITAL_COMMUNITY): Payer: Self-pay

## 2024-08-03 DIAGNOSIS — Z0000 Encounter for general adult medical examination without abnormal findings: Secondary | ICD-10-CM | POA: Diagnosis not present

## 2024-08-03 DIAGNOSIS — N1831 Chronic kidney disease, stage 3a: Secondary | ICD-10-CM | POA: Diagnosis not present

## 2024-08-03 DIAGNOSIS — E7800 Pure hypercholesterolemia, unspecified: Secondary | ICD-10-CM | POA: Diagnosis not present

## 2024-08-03 DIAGNOSIS — I129 Hypertensive chronic kidney disease with stage 1 through stage 4 chronic kidney disease, or unspecified chronic kidney disease: Secondary | ICD-10-CM | POA: Diagnosis not present

## 2024-08-03 DIAGNOSIS — E669 Obesity, unspecified: Secondary | ICD-10-CM | POA: Diagnosis not present

## 2024-08-03 DIAGNOSIS — I700 Atherosclerosis of aorta: Secondary | ICD-10-CM | POA: Diagnosis not present

## 2024-08-03 DIAGNOSIS — M329 Systemic lupus erythematosus, unspecified: Secondary | ICD-10-CM | POA: Diagnosis not present

## 2024-08-03 DIAGNOSIS — E1121 Type 2 diabetes mellitus with diabetic nephropathy: Secondary | ICD-10-CM | POA: Diagnosis not present

## 2024-08-03 DIAGNOSIS — D0512 Intraductal carcinoma in situ of left breast: Secondary | ICD-10-CM | POA: Diagnosis not present

## 2024-08-03 DIAGNOSIS — I2510 Atherosclerotic heart disease of native coronary artery without angina pectoris: Secondary | ICD-10-CM | POA: Diagnosis not present

## 2024-08-05 DIAGNOSIS — S46012D Strain of muscle(s) and tendon(s) of the rotator cuff of left shoulder, subsequent encounter: Secondary | ICD-10-CM | POA: Diagnosis not present

## 2024-08-10 DIAGNOSIS — S46012D Strain of muscle(s) and tendon(s) of the rotator cuff of left shoulder, subsequent encounter: Secondary | ICD-10-CM | POA: Diagnosis not present

## 2024-08-13 DIAGNOSIS — S46012D Strain of muscle(s) and tendon(s) of the rotator cuff of left shoulder, subsequent encounter: Secondary | ICD-10-CM | POA: Diagnosis not present

## 2024-08-17 ENCOUNTER — Encounter: Payer: Self-pay | Admitting: Podiatry

## 2024-08-17 ENCOUNTER — Ambulatory Visit (INDEPENDENT_AMBULATORY_CARE_PROVIDER_SITE_OTHER): Admitting: Podiatry

## 2024-08-17 VITALS — Ht 66.0 in | Wt 194.0 lb

## 2024-08-17 DIAGNOSIS — L600 Ingrowing nail: Secondary | ICD-10-CM

## 2024-08-17 DIAGNOSIS — S46811D Strain of other muscles, fascia and tendons at shoulder and upper arm level, right arm, subsequent encounter: Secondary | ICD-10-CM | POA: Diagnosis not present

## 2024-08-17 NOTE — Patient Instructions (Signed)

## 2024-08-17 NOTE — Progress Notes (Signed)
 Chief Complaint  Patient presents with   Ingrown Toenail    Pt pt is here due to bilateral great toenail ingrown that she wants to have removed.    Subjective: Patient presents today for evaluation of pain to the medial border bilateral great toes. Patient is concerned for possible ingrown nail.  It is very sensitive to touch.  Patient presents today for further treatment and evaluation.  Past Medical History:  Diagnosis Date   Allergy 1990   Arthritis 2011   back (11/07/2012)   Breast cancer (HCC)    Chest pain    Chronic lower back pain    Constipation    GERD (gastroesophageal reflux disease) 1991   Heart murmur    MVP (`11/07/2012)   History of blood transfusion 1990's   ? 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    Past Surgical History:  Procedure Laterality Date   ABDOMINAL HYSTERECTOMY  1997   BREAST LUMPECTOMY WITH RADIOACTIVE SEED LOCALIZATION Left 09/18/2022   Procedure: LEFT BREAST LUMPECTOMY WITH RADIOACTIVE SEED LOCALIZATION;  Surgeon: Belinda Cough, MD;  Location: [facility];  Service: General;  Laterality: Left;   CARPAL TUNNEL RELEASE  2008   right w/fracture OR (11/07/2012)   CESAREAN SECTION  1991, 1993   COLONOSCOPY     POLYPECTOMY     POSTERIOR LUMBAR FUSION  11/07/2012   L4-5 (11/07/2012)   TRIGGER FINGER RELEASE Right 05/04/2016   Procedure: MINOR RELEASE TRIGGER FINGER/A-1 PULLEY;  Surgeon: Cough Robinsons, MD;  Location: [facility];  Service: Orthopedics;  Laterality: Right;   WRIST FRACTURE SURGERY  2008    right (11/07/2012)    Allergies  Allergen Reactions   Amlodipine  Swelling   Amlodipine  Besylate Swelling    Other reaction(s): anaphylaxis   Ciprofloxacin Anaphylaxis   Codeine Nausea Only, Other (See Comments), Anaphylaxis and Nausea And Vomiting    dry heaves (11/07/2012) Other reaction(s): stomach upset   Hydrocodone  Nausea Only and Other (See Comments)    dry heaves (11/07/2012)   Levofloxacin Anaphylaxis and Rash    Other reaction(s): anaphylaxis   Ultram [Tramadol Hcl] Nausea Only and Other (See Comments)    dry heaves (11/07/2012)   Hydrocodone -Acetaminophen  Nausea And Vomiting    Other reaction(s): stomach upset    Objective:  General: Well developed, nourished, in no acute distress, alert and oriented x3   Dermatology: Skin is warm, dry and supple bilateral.  Medial border bilateral great toes is tender with evidence of an ingrowing nail. Pain on palpation noted to the border of the nail fold. The remaining nails appear unremarkable at this time.   Vascular: DP and PT pulses palpable.  No clinical evidence of vascular compromise  Neruologic: Grossly intact via light touch bilateral.  Musculoskeletal: No pedal deformity noted  Assesement: #1 Paronychia with ingrowing nail medial border bilateral great toes  Plan of Care:  -Patient evaluated.  -Discussed treatment alternatives and plan of care. Explained nail avulsion procedure and post procedure course to patient. -Patient  opted for permanent partial nail avulsion of the ingrown portion of the nail.  -Prior to procedure, local anesthesia infiltration utilized using 3 ml of a 50:50 mixture of 2% plain lidocaine  and 0.5% plain marcaine  in a normal hallux block fashion and a betadine prep performed.  -Partial permanent nail avulsion with chemical matrixectomy performed using 3x30sec applications of phenol followed by alcohol flush.  -Light dressing applied.  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 Thresa EMERSON Sar, DPM    [address] [place], [place] [address]                Office [phone]  Fax [phone]

## 2024-08-21 DIAGNOSIS — S46012D Strain of muscle(s) and tendon(s) of the rotator cuff of left shoulder, subsequent encounter: Secondary | ICD-10-CM | POA: Diagnosis not present

## 2024-08-23 ENCOUNTER — Other Ambulatory Visit (HOSPITAL_BASED_OUTPATIENT_CLINIC_OR_DEPARTMENT_OTHER): Payer: Self-pay | Admitting: Cardiovascular Disease

## 2024-08-24 ENCOUNTER — Other Ambulatory Visit: Payer: Self-pay

## 2024-08-24 DIAGNOSIS — S46012D Strain of muscle(s) and tendon(s) of the rotator cuff of left shoulder, subsequent encounter: Secondary | ICD-10-CM | POA: Diagnosis not present

## 2024-08-25 ENCOUNTER — Other Ambulatory Visit (HOSPITAL_COMMUNITY): Payer: Self-pay

## 2024-08-25 MED ORDER — CARVEDILOL 6.25 MG PO TABS
6.2500 mg | ORAL_TABLET | Freq: Two times a day (BID) | ORAL | 0 refills | Status: DC
  Filled 2024-08-25: qty 180, 90d supply, fill #0

## 2024-08-28 DIAGNOSIS — S46012D Strain of muscle(s) and tendon(s) of the rotator cuff of left shoulder, subsequent encounter: Secondary | ICD-10-CM | POA: Diagnosis not present

## 2024-08-31 ENCOUNTER — Other Ambulatory Visit (HOSPITAL_COMMUNITY): Payer: Self-pay

## 2024-08-31 ENCOUNTER — Other Ambulatory Visit: Payer: Self-pay

## 2024-08-31 DIAGNOSIS — S46012D Strain of muscle(s) and tendon(s) of the rotator cuff of left shoulder, subsequent encounter: Secondary | ICD-10-CM | POA: Diagnosis not present

## 2024-08-31 DIAGNOSIS — M5416 Radiculopathy, lumbar region: Secondary | ICD-10-CM | POA: Diagnosis not present

## 2024-09-02 DIAGNOSIS — S46012D Strain of muscle(s) and tendon(s) of the rotator cuff of left shoulder, subsequent encounter: Secondary | ICD-10-CM | POA: Diagnosis not present

## 2024-09-07 ENCOUNTER — Encounter: Payer: Self-pay | Admitting: Podiatry

## 2024-09-07 ENCOUNTER — Ambulatory Visit: Admitting: Podiatry

## 2024-09-07 VITALS — Ht 66.0 in | Wt 194.0 lb

## 2024-09-07 DIAGNOSIS — L600 Ingrowing nail: Secondary | ICD-10-CM | POA: Diagnosis not present

## 2024-09-07 NOTE — Progress Notes (Signed)
   Chief Complaint  Patient presents with   Ingrown Toenail    Pt is here to f/u on bilateral great toenails after having ingrown removed.    Subjective: 63 y.o. female presents today status post permanent nail avulsion procedure of the medial  border bilateral great toes that was performed on 08/17/2024.  Doing well..   Past Medical History:  Diagnosis Date   Allergy 1990   Arthritis 2011   back (11/07/2012)   Breast cancer (HCC)    Chest pain    Chronic lower back pain    Constipation    GERD (gastroesophageal reflux disease) 1991   Heart murmur    MVP (`11/07/2012)   History of blood transfusion 1990's   ? 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    Objective: Neurovascular status intact.  Skin is warm, dry and supple. Nail and respective nail fold appears to be healing appropriately.   Assessment: #1 s/p partial permanent nail matrixectomy medial border bilateral great toes.  08/17/2024   Plan of care: #1 patient was evaluated  #2 light debridement of the periungual debris was performed to the border of the respective toe and nail plate using a tissue nipper. #3 patient is to return to clinic on a PRN basis.  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 Thresa EMERSON Sar, DPM    [address] [place], [place] [address]                Office [phone]  Fax [phone]

## 2024-09-09 DIAGNOSIS — M75112 Incomplete rotator cuff tear or rupture of left shoulder, not specified as traumatic: Secondary | ICD-10-CM | POA: Diagnosis not present

## 2024-09-14 ENCOUNTER — Telehealth: Payer: Self-pay

## 2024-09-14 NOTE — Telephone Encounter (Signed)
Spoke with patient and confirmed appointment on 10/28

## 2024-09-15 ENCOUNTER — Other Ambulatory Visit (HOSPITAL_COMMUNITY): Payer: Self-pay

## 2024-09-15 ENCOUNTER — Inpatient Hospital Stay

## 2024-09-15 ENCOUNTER — Inpatient Hospital Stay: Attending: Hematology and Oncology | Admitting: Hematology and Oncology

## 2024-09-15 ENCOUNTER — Other Ambulatory Visit: Payer: Self-pay

## 2024-09-15 VITALS — BP 126/75 | HR 88 | Temp 98.2°F | Resp 16 | Wt 180.2 lb

## 2024-09-15 DIAGNOSIS — Z170 Estrogen receptor positive status [ER+]: Secondary | ICD-10-CM | POA: Diagnosis not present

## 2024-09-15 DIAGNOSIS — D0512 Intraductal carcinoma in situ of left breast: Secondary | ICD-10-CM | POA: Insufficient documentation

## 2024-09-15 DIAGNOSIS — Z79810 Long term (current) use of selective estrogen receptor modulators (SERMs): Secondary | ICD-10-CM | POA: Diagnosis not present

## 2024-09-15 DIAGNOSIS — R232 Flushing: Secondary | ICD-10-CM | POA: Insufficient documentation

## 2024-09-15 DIAGNOSIS — Z1721 Progesterone receptor positive status: Secondary | ICD-10-CM | POA: Insufficient documentation

## 2024-09-15 DIAGNOSIS — Z8049 Family history of malignant neoplasm of other genital organs: Secondary | ICD-10-CM | POA: Insufficient documentation

## 2024-09-15 DIAGNOSIS — Z90710 Acquired absence of both cervix and uterus: Secondary | ICD-10-CM | POA: Diagnosis not present

## 2024-09-15 LAB — CMP (CANCER CENTER ONLY)
ALT: 24 U/L (ref 0–44)
AST: 19 U/L (ref 15–41)
Albumin: 4 g/dL (ref 3.5–5.0)
Alkaline Phosphatase: 43 U/L (ref 38–126)
Anion gap: 5 (ref 5–15)
BUN: 21 mg/dL (ref 8–23)
CO2: 29 mmol/L (ref 22–32)
Calcium: 10.7 mg/dL — ABNORMAL HIGH (ref 8.9–10.3)
Chloride: 106 mmol/L (ref 98–111)
Creatinine: 1.55 mg/dL — ABNORMAL HIGH (ref 0.44–1.00)
GFR, Estimated: 38 mL/min — ABNORMAL LOW (ref >60)
Glucose, Bld: 91 mg/dL (ref 70–99)
Potassium: 4.2 mmol/L (ref 3.5–5.1)
Sodium: 140 mmol/L (ref 135–145)
Total Bilirubin: 0.5 mg/dL (ref 0.0–1.2)
Total Protein: 7.3 g/dL (ref 6.5–8.1)

## 2024-09-15 MED ORDER — TAMOXIFEN CITRATE 20 MG PO TABS
20.0000 mg | ORAL_TABLET | Freq: Every day | ORAL | 3 refills | Status: AC
  Filled 2024-09-15 – 2024-11-25 (×2): qty 90, 90d supply, fill #0

## 2024-09-15 MED ORDER — FLUZONE 0.5 ML IM SUSY
0.5000 mL | PREFILLED_SYRINGE | Freq: Once | INTRAMUSCULAR | 0 refills | Status: AC
  Filled 2024-09-15: qty 0.5, 1d supply, fill #0

## 2024-09-15 NOTE — Progress Notes (Unsigned)
 [facility] CONSULT NOTE  Patient Care Team: Vickie Velna SAUNDERS, MD as PCP - General (Internal Medicine) Vickie Riggs, MD as PCP - Cardiology (Cardiology) Vickie Cough, MD as Consulting Physician (General Surgery) Vickie Ash, [name]  I reviewed her records extensively and collaborated the history with the patient.  SUMMARY OF ONCOLOGIC HISTORY: Oncology History  Ductal carcinoma in situ (DCIS) of left breast  07/27/2022 Mammogram   Bilateral screening mammogram showed left breast calcifications warranting further evaluation.  No findings suspicious for malignancy in the right breast.  Diagnostic mammogram of the left breast confirmed 1.2 x 0.8 x 1.1 cm hypoechoic circumscribed mass in the left breast at 2:00, no hilar fat or convincing hilar blood flow.  Ultrasound showed several normal lymph nodes.  No enlarged or abnormal left axillary lymph nodes.   08/21/2022 Pathology Results   Pathology from the left breast needle core biopsy showed intermediate nuclear grade DCIS, necrosis present, calcs present, ER 95% positive strong staining PR 40% positive strong staining   09/05/2022 Cancer Staging   Staging form: Breast, AJCC 8th Edition - Clinical stage from 09/05/2022: Stage 0 (cTis (DCIS), cN0, cM0, ER+, PR+) - Signed by Vickie Ash, MD on 03/16/2024 Stage prefix: Initial diagnosis Nuclear grade: G2   09/10/2022 Genetic Testing   Negative [facility] CustomNext-Cancer +RNAinsight Panel.  Report date is 09/23/2022.    The CustomNext-Cancer+RNAinsight panel offered by Vickie Lane includes sequencing and rearrangement analysis for the following 50 genes:  APC, ATM, BAP1, BARD1, BMPR1A, BRCA1, BRCA2, BRIP1, CDH1, CDK4,  CDKN2A, CHEK2, DICER1, MEN1, MLH1, MSH2, MSH6, MUTYH, NBN, NF1, NF2, NTHL1, PALB2, PMS2, POT1, PTEN, RAD51C, RAD51D, RECQL, RET, SDHA, SDHAF2, SDHB, SDHC, SDHD, SMAD4, SMARCA4, STK11, TP53, TSC1, TSC2 and VHL (sequencing and deletion/duplication); AXIN2, HOXB13, MITF, MSH3, POLD1 and POLE (sequencing only); EPCAM and GREM1 (deletion/duplication only). RNA data is routinely analyzed for use in variant interpretation for all genes.       09/18/2022 Surgery   DCIS, 1.5cm, no SLN removed   11/2022 -  Anti-estrogen oral therapy   Tamoxifen     She had left breast lumpectomy since last visit which showed DCIS, intermediate grade, 1.5 cm, margins are negative, prior prognostic showed ER 95% positive PR 40% positive   Discussed the use of AI scribe software for clinical note transcription with the patient, who gave verbal consent to proceed.  History of Present Illness Vickie Lane is a 63 year old female with breast cancer who presents for follow-up regarding tamoxifen  therapy and associated symptoms.     Rest of the pertinent 10 point ROS reviewed and negative  MEDICAL HISTORY:  Past Medical History:  Diagnosis Date   Allergy 1990   Arthritis 2011   back (11/07/2012)   Breast cancer (HCC)    Chest pain    Chronic lower back pain    Constipation    GERD (gastroesophageal reflux disease) 1991   Heart murmur    MVP (`11/07/2012)   History of blood transfusion 1990's   ? 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    SURGICAL HISTORY: Past Surgical History:  Procedure  Laterality Date   ABDOMINAL HYSTERECTOMY  1997   BREAST LUMPECTOMY WITH RADIOACTIVE SEED LOCALIZATION Left 09/18/2022   Procedure: LEFT BREAST LUMPECTOMY WITH RADIOACTIVE SEED LOCALIZATION;  Surgeon: Vickie Cough, MD;  Location: [facility];  Service: General;  Laterality: Left;   CARPAL TUNNEL RELEASE  2008   right w/fracture OR (11/07/2012)   CESAREAN SECTION  1991, 1993   COLONOSCOPY     POLYPECTOMY     POSTERIOR LUMBAR FUSION  11/07/2012   L4-5 (11/07/2012)   TRIGGER FINGER RELEASE Right 05/04/2016   Procedure: MINOR RELEASE TRIGGER FINGER/A-1 PULLEY;  Surgeon: Lane Robinsons, MD;  Location: [facility];  Service: Orthopedics;  Laterality: Right;   WRIST FRACTURE SURGERY  2008   right (11/07/2012)    SOCIAL HISTORY: Social History   Socioeconomic History   Marital status: Single    Spouse name: Not on file   Number of children: Not on file   Years of education: Not on file   Highest education level: Not on file  Occupational History   Occupation: Acupuncturist: [employer]  Tobacco Use   Smoking status: Never   Smokeless tobacco: Never  Vaping Use   Vaping status: Never Used  Substance and Sexual Activity   Alcohol use: Yes    Comment: 11/07/2012 once/month might have a glass of wine   Drug use: No   Sexual activity: Yes    Birth control/protection: Surgical  Other Topics Concern   Not on file  Social History Narrative   Not on file   Social Drivers of Health   Financial Resource Strain: Low Risk  (09/05/2022)   Overall Financial Resource Strain (CARDIA)    Difficulty of Paying Living Expenses: Not hard at all  Food Insecurity: No Food Insecurity (09/05/2022)   Hunger Vital Sign    Worried About Running Out of Food in the Last Year: Never true    Ran Out of Food in the Last Year: Never true  Transportation Needs: No Transportation Needs (09/05/2022)   PRAPARE - Administrator, Civil Service  (Medical): No    Lack of Transportation (Non-Medical): No  Physical Activity: Sufficiently Active (10/03/2021)   Exercise Vital Sign    Days of Exercise per Week: 4 days    Minutes of Exercise per Session: 50 min  Stress: Not on file  Social Connections: Not on file  Intimate Partner Violence: Not on file    FAMILY HISTORY: Family History  Problem Relation Age of Onset   Hypertension Mother    Hypertension Father    Heart attack Father    Sudden death Father    Cancer Paternal Aunt        GYN (uterine/cervical); d. mid 34s   Heart disease Other    Hypertension Other    Colon cancer Neg Hx    Colon polyps Neg Hx    Rectal cancer Neg Hx    Stomach cancer Neg Hx    Esophageal cancer Neg Hx     ALLERGIES:  is allergic to amlodipine , amlodipine  besylate, ciprofloxacin, codeine, hydrocodone , levofloxacin, ultram [tramadol hcl], and hydrocodone -acetaminophen .  MEDICATIONS:  Current Outpatient Medications  Medication Sig Dispense Refill   aspirin  EC 81 MG tablet Take 81 mg by mouth every morning.     b complex vitamins capsule Take 1 capsule by mouth daily.     carvedilol  (COREG ) 6.25 MG tablet Take 1 tablet (6.25 mg total) by mouth 2 (two) times daily. 180 tablet 0   Cetirizine-Pseudoephedrine (ZYRTEC-D PO) Take 1 tablet by mouth daily as needed. For allergies     chlorthalidone  (HYGROTON ) 25 MG tablet Take 0.5 tablets (12.5 mg total) by mouth daily. 90 tablet 3   Cholecalciferol (VITAMIN D3 PO) Take 125 mcg by mouth daily.     cyclobenzaprine  (FLEXERIL ) 10 MG tablet Take 1 tablet (10 mg) by mouth 3 times daily as needed (Patient not taking: Reported on 09/07/2024) 15 tablet 0   cyclobenzaprine  (FLEXERIL ) 10 MG tablet Take 1 tablet (10 mg total) by mouth at bedtime as needed. 30 tablet 0   cyclobenzaprine  (FLEXERIL ) 10 MG tablet Take 1 tablet (10 mg total) by mouth 3 (three) times daily as needed. 60 tablet 1   docusate sodium  (COLACE) 100 MG capsule Take 1 capsule (100 mg total)  by mouth daily as needed. 30 capsule 2   doxycycline  (VIBRAMYCIN ) 100 MG capsule Take 1 capsule (100 mg total) by mouth 2 (two) times daily. To be taken after surgery (Patient not taking: Reported on 09/07/2024) 20 capsule 0   esomeprazole (NEXIUM) 40 MG capsule Take 40 mg by mouth as needed.     FREESTYLE LITE test strip   11   Lancets (FREESTYLE) lancets   12   meloxicam (MOBIC) 7.5 MG tablet Take 7.5 mg by mouth daily as needed for pain. (Patient not taking: Reported on 09/07/2024)     methocarbamol  (ROBAXIN -750) 750 MG tablet Take 1 tablet (750 mg total) by mouth 2 (two) times daily as needed for muscle spasms. 20 tablet 2   Multiple Vitamin (MULTIVITAMIN WITH MINERALS) TABS Take 1 tablet by mouth daily.     ondansetron  (ZOFRAN ) 4 MG tablet Take 1 tablet (4 mg total) by mouth every 8 (eight) hours as needed for nausea or vomiting. (Patient not taking: Reported on 09/07/2024) 40 tablet 0   oxyCODONE -acetaminophen  (PERCOCET) 5-325 MG tablet Take 1-2 tablets by mouth every 6 (six) hours as needed. To be taken after surgery (Patient not taking: Reported on 09/07/2024) 40 tablet 0   predniSONE  (STERAPRED UNI-PAK 21 TAB) 10 MG (21) TBPK tablet Take as directed 21 tablet 3   predniSONE  (STERAPRED UNI-PAK 21 TAB) 10 MG (21) TBPK tablet Take as directed 21 tablet 3   rivaroxaban  (XARELTO ) 10 MG TABS tablet Take 1 tablet (10 mg total) by mouth daily. To be taken after surgery to prevent blood clots (Patient not taking: Reported on 09/07/2024) 30 tablet 0   Semaglutide , 2 MG/DOSE, (OZEMPIC , 2 MG/DOSE,) 8 MG/3ML SOPN Inject 2 mg into the skin once a week. 3 mL 11   simvastatin  (ZOCOR ) 40 MG tablet Take 1 tablet (40 mg total) by mouth at bedtime. 90 tablet 3   spironolactone  (ALDACTONE ) 25 MG tablet Take 1 tablet (25 mg total) by mouth daily. 90 tablet 3   tamoxifen  (NOLVADEX ) 20 MG tablet Take 1 tablet (20 mg total) by mouth daily. 90 tablet 3   valACYclovir  (VALTREX ) 500 MG tablet Take 1 tablet (500 mg  total) by mouth daily. 30 tablet 11   valsartan  (DIOVAN ) 320 MG tablet Take 1 tablet (320 mg total) by mouth daily. 90 tablet 3   venlafaxine  XR (EFFEXOR -XR) 75  MG 24 hr capsule Take 1 capsule (75 mg total) by mouth daily with breakfast. 30 capsule 3   vitamin B-12 (CYANOCOBALAMIN) 1000 MCG tablet Take 5,000 mcg by mouth daily.     No current facility-administered medications for this visit.    REVIEW OF SYSTEMS:   Constitutional: Denies fevers, chills or abnormal night sweats Eyes: Denies blurriness of vision, double vision or watery eyes Ears, nose, mouth, throat, and face: Denies mucositis or sore throat Respiratory: Denies Lane, dyspnea or wheezes Cardiovascular: Denies palpitation, chest discomfort or lower extremity swelling Gastrointestinal:  Denies nausea, heartburn or change in bowel habits Skin: Denies abnormal skin rashes Lymphatics: Denies new lymphadenopathy or easy bruising Neurological:Denies numbness, tingling or new weaknesses Behavioral/Psych: Mood is stable, no new changes  Breast: Denies any palpable lumps or discharge All other systems were reviewed with the patient and are negative.  PHYSICAL EXAMINATION: ECOG PERFORMANCE STATUS: 0 - Asymptomatic  Vitals:   09/15/24 1153  BP: 126/75  Pulse: 88  Resp: 16  Temp: 98.2 F (36.8 C)  SpO2: 100%    Filed Weights   09/15/24 1153  Weight: 180 lb 3.2 oz (81.7 kg)     NECK: No cervical lymphadenopathy. BREAST: Breasts without masses, tenderness, or asymmetry. Great cosmetic outcome. EXTREMITIES: Legs without swelling.  LABORATORY DATA:  I have reviewed the data as listed Lab Results  Component Value Date   WBC 5.1 02/14/2023   HGB 12.0 02/14/2023   HCT 35.9 (L) 02/14/2023   MCV 84.7 02/14/2023   PLT 270 02/14/2023   Lab Results  Component Value Date   NA 142 02/14/2023   K 3.6 02/14/2023   CL 107 02/14/2023   CO2 30 02/14/2023    RADIOGRAPHIC STUDIES: I have personally reviewed the  radiological reports and agreed with the findings in the report.  ASSESSMENT AND PLAN:  No problem-specific Assessment & Plan notes found for this encounter.      Total time spent: 20 min including history, physical exam, review of records, counseling and coordination of care  All questions were answered. The patient knows to call the clinic with any problems, questions or concerns.    Amber Stalls, MD 09/15/24

## 2024-09-16 ENCOUNTER — Ambulatory Visit: Payer: Self-pay | Admitting: Hematology and Oncology

## 2024-09-16 NOTE — Assessment & Plan Note (Signed)
 This is a very pleasant 63 year old female patient with newly diagnosed left breast DCIS, ER/PR positive referred to breast [facility] for recommendations.  She is now status post left breast lumpectomy here  on adjuvant tamoxifen .   Assessment and Plan Assessment & Plan Breast cancer Breast cancer managed with tamoxifen . Normal mammogram in September.  - Refill tamoxifen  prescription at [facility]. - Order hepatic panel to monitor liver function annually while on tamoxifen .  Hot flashes secondary to tamoxifen  Severe hot flashes due to tamoxifen , affecting daily activities. - She is already on effexor , at this time doesn't want to try anything new Encouraged hydration and exercise.  Weight bearing exercises recommended.

## 2024-09-17 NOTE — Telephone Encounter (Signed)
 Attempted to call pt. LVM for call back

## 2024-09-17 NOTE — Telephone Encounter (Signed)
-----   Message from [place] sent at 09/16/2024  7:57 AM EDT ----- LFT's are fine, but creatinine is worse. This is unrelated to breast cancer or tamoxifen . I would recommend she continue to follow up with her PCP. ----- Message ----- From: Interface, Lab In [place] Sent: 09/15/2024  12:56 PM EDT To: Amber Stalls, MD

## 2024-09-21 ENCOUNTER — Encounter: Payer: Self-pay | Admitting: Radiology

## 2024-09-28 ENCOUNTER — Other Ambulatory Visit (HOSPITAL_COMMUNITY): Payer: Self-pay

## 2024-09-28 ENCOUNTER — Telehealth: Payer: Self-pay | Admitting: Cardiovascular Disease

## 2024-09-28 MED ORDER — HYDROMORPHONE HCL 2 MG PO TABS
2.0000 mg | ORAL_TABLET | ORAL | 0 refills | Status: AC | PRN
  Filled 2024-09-28: qty 30, 5d supply, fill #0

## 2024-09-28 MED ORDER — METHOCARBAMOL 500 MG PO TABS
500.0000 mg | ORAL_TABLET | Freq: Four times a day (QID) | ORAL | 0 refills | Status: DC | PRN
  Filled 2024-09-28: qty 30, 8d supply, fill #0

## 2024-09-28 NOTE — Telephone Encounter (Signed)
 Spoke with pt and she states that the surgeons office contacted her today and rescheduled her procedure until later in December and she has an appt with Reche [facility]inder on 11/06/24 which I will add pre op clearance to the appt note. Will route back to surgeons office to make them aware.

## 2024-09-28 NOTE — Telephone Encounter (Signed)
   Pre-operative Risk Assessment    Patient Name: Vickie Lane  DOB: March 10, 1961 MRN: [id number]   Date of last office visit: 11/11/23 Date of next office visit: 11/06/24   Request for Surgical Clearance    Procedure:  Shoulder scope  Date of Surgery:  Clearance 09/28/24                                Surgeon:  Dr.Chandler  Surgeon's Group or Practice Name:  [facility] Phone number:  [phone] Fax number:  [phone]   Type of Clearance Requested:   - Medical    Type of Anesthesia:  choice    Additional requests/questions:     SignedBarbee DELENA Sharps   09/28/2024, 2:30 PM

## 2024-09-28 NOTE — Telephone Encounter (Signed)
   Name: Vickie Lane  DOB: 07-May-1961  MRN: [id number]  Primary Cardiologist: Annabella Scarce, MD   Preoperative team, please contact this patient and set up a phone call appointment for further preoperative risk assessment. Please obtain consent and complete medication review. Thank you for your help.  I confirm that guidance regarding antiplatelet and oral anticoagulation therapy has been completed and, if necessary, noted below.  Regarding ASA therapy, we recommend continuation of ASA throughout the perioperative period. However, if the surgeon feels that cessation of ASA is required in the perioperative period, it may be stopped 5-7 days prior to surgery with a plan to resume it as soon as felt to be feasible from a surgical standpoint in the post-operative period.   I also confirmed the patient resides in the state of [place] . As per [facility] Medical Board telemedicine laws, the patient must reside in the state in which the provider is licensed.   Oliva Montecalvo E Mariona Scholes, NP 09/28/2024, 4:19 PM [facility]

## 2024-10-09 ENCOUNTER — Encounter: Payer: Self-pay | Admitting: Obstetrics

## 2024-10-09 ENCOUNTER — Ambulatory Visit (INDEPENDENT_AMBULATORY_CARE_PROVIDER_SITE_OTHER): Admitting: Obstetrics

## 2024-10-09 VITALS — BP 127/83 | HR 79 | Ht 65.0 in | Wt 182.0 lb

## 2024-10-09 DIAGNOSIS — Z01419 Encounter for gynecological examination (general) (routine) without abnormal findings: Secondary | ICD-10-CM

## 2024-10-09 DIAGNOSIS — Z90710 Acquired absence of both cervix and uterus: Secondary | ICD-10-CM

## 2024-10-09 DIAGNOSIS — Z780 Asymptomatic menopausal state: Secondary | ICD-10-CM

## 2024-10-09 DIAGNOSIS — E66811 Obesity, class 1: Secondary | ICD-10-CM

## 2024-10-09 DIAGNOSIS — N182 Chronic kidney disease, stage 2 (mild): Secondary | ICD-10-CM

## 2024-10-09 NOTE — Progress Notes (Signed)
 Pt presents for annual. Pt declines std testing. Pt needs refill on Valtrex . Pt last mammogram was done on 07/29/24. No questions or concerns at this time.

## 2024-10-09 NOTE — Progress Notes (Signed)
 Subjective:        Dorella Laster Rivers-Sams is a 63 y.o. female here for a routine exam.  Current complaints: None.    Personal health questionnaire:  Is patient Ashkenazi Jewish, have a family history of breast and/or ovarian cancer: no Is there a family history of uterine cancer diagnosed at age < 88, gastrointestinal cancer, urinary tract cancer, family member who is a Personnel Officer syndrome-associated carrier: no Is the patient overweight and hypertensive, family history of diabetes, personal history of gestational diabetes, preeclampsia or PCOS: no Is patient over 33, have PCOS,  family history of premature CHD under age 69, diabetes, smoke, have hypertension or peripheral artery disease:  no At any time, has a partner hit, kicked or otherwise hurt or frightened you?: no Over the past 2 weeks, have you felt down, depressed or hopeless?: no Over the past 2 weeks, have you felt little interest or pleasure in doing things?:no   Gynecologic History No LMP recorded. Patient has had a hysterectomy. Contraception: status post hysterectomy Last Pap:  2012 . Results were: normal Last mammogram: 2025. Results were: normal  Obstetric History OB History  Gravida Para Term Preterm AB Living  6 3 1 2 3 3   SAB IAB Ectopic Multiple Live Births  1 2   3     # Outcome Date GA Lbr Len/2nd Weight Sex Type Anes PTL Lv  6 Preterm 03/05/92 [redacted]w[redacted]d   M CS-LTranv   LIV  5 Preterm 07/30/90 [redacted]w[redacted]d   M CS-LTranv   LIV  4 Term 03/15/76    F Vag-Spont   LIV  3 IAB           2 IAB           1 SAB             Past Medical History:  Diagnosis Date   Allergy 1990   Arthritis 2011   back (11/07/2012)   Breast cancer (HCC)    Chest pain    Chronic lower back pain    Constipation    GERD (gastroesophageal reflux disease) 1991   Heart murmur    MVP (`11/07/2012)   History of blood transfusion 1990's   ? 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    Past Surgical History:  Procedure Laterality Date   ABDOMINAL HYSTERECTOMY  1997   BREAST LUMPECTOMY WITH RADIOACTIVE SEED LOCALIZATION Left 09/18/2022   Procedure: LEFT BREAST LUMPECTOMY WITH RADIOACTIVE SEED LOCALIZATION;  Surgeon: Belinda Cough, MD;  Location: [facility];  Service: General;  Laterality: Left;   CARPAL TUNNEL RELEASE  2008   right w/fracture OR (11/07/2012)   CESAREAN SECTION  1991, 1993   COLONOSCOPY     POLYPECTOMY     POSTERIOR LUMBAR FUSION  11/07/2012   L4-5 (11/07/2012)   TRIGGER FINGER RELEASE Right 05/04/2016   Procedure: MINOR RELEASE TRIGGER FINGER/A-1 PULLEY;  Surgeon: Cough Robinsons, MD;  Location: [facility];  Service: Orthopedics;  Laterality: Right;   WRIST  FRACTURE SURGERY  2008   right (11/07/2012)     Current Outpatient Medications:    aspirin  EC 81 MG tablet, Take 81 mg by mouth every morning., Disp: , Rfl:    b complex vitamins capsule, Take 1 capsule by mouth daily., Disp: , Rfl:    carvedilol  (COREG ) 6.25 MG tablet, Take 1 tablet (6.25 mg total) by mouth 2 (two) times daily., Disp: 180 tablet, Rfl: 0   Cetirizine-Pseudoephedrine (ZYRTEC-D PO), Take 1 tablet by mouth daily as needed. For allergies, Disp: , Rfl:    chlorthalidone  (HYGROTON ) 25 MG tablet, Take 0.5 tablets (12.5 mg total) by mouth daily., Disp: 90 tablet, Rfl: 3   Cholecalciferol (VITAMIN D3 PO), Take 125 mcg by mouth daily., Disp: , Rfl:    cyclobenzaprine  (FLEXERIL ) 10 MG tablet, Take 1 tablet (10 mg total) by mouth 3 (three) times daily as needed., Disp: 60 tablet, Rfl: 1   docusate sodium   (COLACE) 100 MG capsule, Take 1 capsule (100 mg total) by mouth daily as needed., Disp: 30 capsule, Rfl: 2   esomeprazole (NEXIUM) 40 MG capsule, Take 40 mg by mouth as needed., Disp: , Rfl:    FREESTYLE LITE test strip, , Disp: , Rfl: 11   HYDROmorphone  (DILAUDID ) 2 MG tablet, Take 1 tablet (2 mg total) by mouth every 4 (four) hours as needed for pain, Disp: 30 tablet, Rfl: 0   Lancets (FREESTYLE) lancets, , Disp: , Rfl: 12   methocarbamol  (ROBAXIN ) 500 MG tablet, Take 1 tablet (500 mg total) by mouth every 6 (six) hours as needed for muscle pain or spasms, Disp: 30 tablet, Rfl: 0   methocarbamol  (ROBAXIN -750) 750 MG tablet, Take 1 tablet (750 mg total) by mouth 2 (two) times daily as needed for muscle spasms., Disp: 20 tablet, Rfl: 2   Multiple Vitamin (MULTIVITAMIN WITH MINERALS) TABS, Take 1 tablet by mouth daily., Disp: , Rfl:    oxyCODONE -acetaminophen  (PERCOCET) 5-325 MG tablet, Take 1-2 tablets by mouth every 6 (six) hours as needed. To be taken after surgery, Disp: 40 tablet, Rfl: 0   predniSONE  (STERAPRED UNI-PAK 21 TAB) 10 MG (21) TBPK tablet, Take as directed, Disp: 21 tablet, Rfl: 3   Semaglutide , 2 MG/DOSE, (OZEMPIC , 2 MG/DOSE,) 8 MG/3ML SOPN, Inject 2 mg into the skin once a week., Disp: 3 mL, Rfl: 11   simvastatin  (ZOCOR ) 40 MG tablet, Take 1 tablet (40 mg total) by mouth at bedtime., Disp: 90 tablet, Rfl: 3   spironolactone  (ALDACTONE ) 25 MG tablet, Take 1 tablet (25 mg total) by mouth daily., Disp: 90 tablet, Rfl: 3   tamoxifen  (NOLVADEX ) 20 MG tablet, Take 1 tablet (20 mg total) by mouth daily., Disp: 90 tablet, Rfl: 3   valACYclovir  (VALTREX ) 500 MG tablet, Take 1 tablet (500 mg total) by mouth daily., Disp: 30 tablet, Rfl: 11   valsartan  (DIOVAN ) 320 MG tablet, Take 1 tablet (320 mg total) by mouth daily., Disp: 90 tablet, Rfl: 3   venlafaxine  XR (EFFEXOR -XR) 75 MG 24 hr capsule, Take 1 capsule (75 mg total) by mouth daily with breakfast., Disp: 30 capsule, Rfl: 3   vitamin B-12  (CYANOCOBALAMIN) 1000 MCG tablet, Take 5,000 mcg by mouth daily., Disp: , Rfl:    cyclobenzaprine  (FLEXERIL ) 10 MG tablet, Take 1 tablet (10 mg) by mouth 3 times daily as needed (Patient not taking: Reported on 10/09/2024), Disp: 15 tablet, Rfl: 0   cyclobenzaprine  (FLEXERIL ) 10 MG tablet, Take 1 tablet (10 mg total) by mouth at bedtime as needed., Disp:  30 tablet, Rfl: 0   doxycycline  (VIBRAMYCIN ) 100 MG capsule, Take 1 capsule (100 mg total) by mouth 2 (two) times daily. To be taken after surgery (Patient not taking: Reported on 10/09/2024), Disp: 20 capsule, Rfl: 0   meloxicam (MOBIC) 7.5 MG tablet, Take 7.5 mg by mouth daily as needed for pain. (Patient not taking: Reported on 10/09/2024), Disp: , Rfl:    ondansetron  (ZOFRAN ) 4 MG tablet, Take 1 tablet (4 mg total) by mouth every 8 (eight) hours as needed for nausea or vomiting. (Patient not taking: Reported on 10/09/2024), Disp: 40 tablet, Rfl: 0   predniSONE  (STERAPRED UNI-PAK 21 TAB) 10 MG (21) TBPK tablet, Take as directed, Disp: 21 tablet, Rfl: 3   rivaroxaban  (XARELTO ) 10 MG TABS tablet, Take 1 tablet (10 mg total) by mouth daily. To be taken after surgery to prevent blood clots (Patient not taking: Reported on 10/09/2024), Disp: 30 tablet, Rfl: 0 Allergies  Allergen Reactions   Amlodipine  Swelling   Amlodipine  Besylate Swelling    Other reaction(s): anaphylaxis   Ciprofloxacin Anaphylaxis   Codeine Nausea Only, Other (See Comments), Anaphylaxis and Nausea And Vomiting    dry heaves (11/07/2012) Other reaction(s): stomach upset   Hydrocodone  Nausea Only and Other (See Comments)    dry heaves (11/07/2012)   Levofloxacin Anaphylaxis and Rash    Other reaction(s): anaphylaxis   Ultram [Tramadol Hcl] Nausea Only and Other (See Comments)    dry heaves (11/07/2012)   Hydrocodone -Acetaminophen  Nausea And Vomiting    Other reaction(s): stomach upset    Social History   Tobacco Use   Smoking status: Never   Smokeless tobacco:  Never  Substance Use Topics   Alcohol use: Yes    Comment: 11/07/2012 once/month might have a glass of wine    Family History  Problem Relation Age of Onset   Hypertension Mother    Hypertension Father    Heart attack Father    Sudden death Father    Cancer Paternal Aunt        GYN (uterine/cervical); d. mid 2s   Heart disease Other    Hypertension Other    Colon cancer Neg Hx    Colon polyps Neg Hx    Rectal cancer Neg Hx    Stomach cancer Neg Hx    Esophageal cancer Neg Hx       Review of Systems  Constitutional: negative for fatigue and weight loss Respiratory: negative for cough and wheezing Cardiovascular: negative for chest pain, fatigue and palpitations Gastrointestinal: negative for abdominal pain and change in bowel habits Musculoskeletal:negative for myalgias Neurological: negative for gait problems and tremors Behavioral/Psych: negative for abusive relationship, depression Endocrine: negative for temperature intolerance    Genitourinary:negative for abnormal menstrual periods, genital lesions, hot flashes, sexual problems and vaginal discharge Integument/breast: negative for breast lump, breast tenderness, nipple discharge and skin lesion(s)    Objective:       BP 127/83   Pulse 79   Ht 5' 5 (1.651 m)   Wt 182 lb (82.6 kg)   BMI 30.29 kg/m  General:   Alert and no distress  Skin:   no rash or abnormalities  Lungs:   clear to auscultation bilaterally  Heart:   regular rate and rhythm, S1, S2 normal, no murmur, click, rub or gallop  Breasts:   normal without suspicious masses, skin or nipple changes or axillary nodes  Abdomen:  normal findings: no organomegaly, soft, non-tender and no hernia  Pelvis:  External genitalia: normal general appearance  Urinary system: urethral meatus normal and bladder without fullness, nontender Vaginal: normal without tenderness, induration or masses Cervix: normal appearance Adnexa: normal bimanual exam Uterus:  anteverted and non-tender, normal size   Lab Review Urine pregnancy test Labs reviewed yes Radiologic studies reviewed yes  I have spent a total of 20 minutes of face-to-face time, excluding clinical staff time, reviewing notes and preparing to see patient, ordering tests and/or medications, and counseling the patient.   Assessment:    1. Encounter for gynecological examination (Primary)  2. S/P TAH (total abdominal hysterectomy) for fibroids  3. Menopause - doing well  4. Obesity (BMI 30.0-34.9) - weight reduction with the aid of dietary changes, exercise and behavioral modification recommended-  5. Chronic kidney disease, stage 2 (mild) - clinically stable.  Managed by PCP      Plan:    Education reviewed: calcium supplements, depression evaluation, low fat, low cholesterol diet, safe sex/STD prevention, self breast exams, and weight bearing exercise. Follow up in: 1 year.     CARLIN RONAL CENTERS, MD, FACOG Attending Obstetrician & Gynecologist, [facility] for [facility], [facility], [place] 10/09/2024

## 2024-10-13 ENCOUNTER — Ambulatory Visit (HOSPITAL_BASED_OUTPATIENT_CLINIC_OR_DEPARTMENT_OTHER): Admitting: Family

## 2024-10-15 ENCOUNTER — Other Ambulatory Visit (HOSPITAL_BASED_OUTPATIENT_CLINIC_OR_DEPARTMENT_OTHER): Payer: Self-pay | Admitting: Cardiovascular Disease

## 2024-10-15 DIAGNOSIS — E1159 Type 2 diabetes mellitus with other circulatory complications: Secondary | ICD-10-CM

## 2024-10-19 ENCOUNTER — Encounter (HOSPITAL_BASED_OUTPATIENT_CLINIC_OR_DEPARTMENT_OTHER): Payer: Self-pay

## 2024-10-20 ENCOUNTER — Other Ambulatory Visit (HOSPITAL_COMMUNITY): Payer: Self-pay

## 2024-10-20 MED ORDER — SPIRONOLACTONE 25 MG PO TABS
25.0000 mg | ORAL_TABLET | Freq: Every day | ORAL | 3 refills | Status: AC
  Filled 2024-10-20: qty 90, 90d supply, fill #0

## 2024-10-20 MED ORDER — VALSARTAN 320 MG PO TABS
320.0000 mg | ORAL_TABLET | Freq: Every day | ORAL | 3 refills | Status: AC
  Filled 2024-10-20: qty 90, 90d supply, fill #0

## 2024-10-21 ENCOUNTER — Other Ambulatory Visit (HOSPITAL_COMMUNITY): Payer: Self-pay

## 2024-11-06 ENCOUNTER — Other Ambulatory Visit (HOSPITAL_COMMUNITY): Payer: Self-pay

## 2024-11-06 ENCOUNTER — Ambulatory Visit (HOSPITAL_BASED_OUTPATIENT_CLINIC_OR_DEPARTMENT_OTHER): Admitting: Family

## 2024-11-06 ENCOUNTER — Encounter (HOSPITAL_BASED_OUTPATIENT_CLINIC_OR_DEPARTMENT_OTHER): Payer: Self-pay | Admitting: Family

## 2024-11-06 ENCOUNTER — Other Ambulatory Visit: Payer: Self-pay

## 2024-11-06 VITALS — BP 122/92 | HR 83 | Ht 65.0 in | Wt 184.6 lb

## 2024-11-06 DIAGNOSIS — I152 Hypertension secondary to endocrine disorders: Secondary | ICD-10-CM

## 2024-11-06 DIAGNOSIS — Z01810 Encounter for preprocedural cardiovascular examination: Secondary | ICD-10-CM

## 2024-11-06 DIAGNOSIS — I25118 Atherosclerotic heart disease of native coronary artery with other forms of angina pectoris: Secondary | ICD-10-CM | POA: Diagnosis not present

## 2024-11-06 DIAGNOSIS — E782 Mixed hyperlipidemia: Secondary | ICD-10-CM

## 2024-11-06 DIAGNOSIS — I10 Essential (primary) hypertension: Secondary | ICD-10-CM | POA: Diagnosis not present

## 2024-11-06 DIAGNOSIS — E1159 Type 2 diabetes mellitus with other circulatory complications: Secondary | ICD-10-CM | POA: Diagnosis not present

## 2024-11-06 MED ORDER — SIMVASTATIN 40 MG PO TABS
40.0000 mg | ORAL_TABLET | Freq: Every day | ORAL | 3 refills | Status: AC
  Filled 2024-11-06 – 2024-12-25 (×2): qty 90, 90d supply, fill #0

## 2024-11-06 MED ORDER — OZEMPIC (2 MG/DOSE) 8 MG/3ML ~~LOC~~ SOPN
2.0000 mg | PEN_INJECTOR | SUBCUTANEOUS | 11 refills | Status: AC
  Filled 2024-11-06 – 2024-12-01 (×4): qty 3, 28d supply, fill #0
  Filled 2024-12-25: qty 3, 28d supply, fill #1

## 2024-11-06 MED ORDER — VALSARTAN 320 MG PO TABS
320.0000 mg | ORAL_TABLET | Freq: Every day | ORAL | 3 refills | Status: AC
  Filled 2024-11-06: qty 90, 90d supply, fill #0

## 2024-11-06 MED ORDER — SPIRONOLACTONE 25 MG PO TABS
25.0000 mg | ORAL_TABLET | Freq: Every day | ORAL | 3 refills | Status: AC
  Filled 2024-11-06: qty 90, 90d supply, fill #0

## 2024-11-06 MED ORDER — CHLORTHALIDONE 25 MG PO TABS
12.5000 mg | ORAL_TABLET | Freq: Every day | ORAL | 3 refills | Status: AC
  Filled 2024-11-06 – 2024-11-17 (×2): qty 45, 90d supply, fill #0

## 2024-11-06 MED ORDER — CARVEDILOL 6.25 MG PO TABS
6.2500 mg | ORAL_TABLET | Freq: Two times a day (BID) | ORAL | 0 refills | Status: DC
  Filled 2024-11-06: qty 180, 90d supply, fill #0

## 2024-11-06 NOTE — Progress Notes (Signed)
 " Cardiology Office Note:  .   Date:  11/06/2024  ID:  Vickie Lane, DOB 07/06/1961, MRN [id number] PCP: Vernon Velna SAUNDERS, MD  Nephrology: Dr. [facility] Providers Cardiologist:  Annabella Scarce, MD Cardiology APP:  Vannie Reche RAMAN, NP    History of Present Illness: .   Vickie Lane is a 63 y.o. female  with a hx of HLD, aortic atherosclerosis, HTN, nonobstructive CAD, sickle cell trait, GERD, MVP, DM2 managed by diet and exercise.    She had cardiac CTA in 2017 with calcium score of 34 placing her in 90th percentile for age and sec matched control. She had less than 50% disease which was nonobstructive in prox LAD and D2 with <30% calcific disease in proximal and mid RCA. Echocardiogram 08/2018 normal LVEF 55-60% and no valvular abnormality.    Noted to have elevated blood pressure since she was 63 years old which worsens during her pregnancies including HELLP syndrome.  Benazepril  previously switched to Benicar .  Amlodipine  previously discontinued due to lower extremity edema.  Olmesartan  later discontinued and 320 mg of valsartan  initiated.  Clonidine  0.2 mg twice daily continued.  Hydralazine  later added.  Renal aldosterone levels indeterminate.  CT abdomen unremarkable though did note minimal aortic atherosclerosis.   Saw Dr. Scarce of 02/07/2022 and hydralazine  was stopped and transition to carvedilol  12.5 mg twice daily due to tachycardia.  Chlorthalidone , losartan, spironolactone  were continued. Saw pharmacy team 03/13/22 and Carvedilol  had to be reduced due to hypotension, dizziness. 08/2022 underwent lumpectomy due to breast cancer.    At visit 07/17/23 she was started on Ozempic . Gradually up-titrated to 2mg  weekly. Prior gastric pain and nausea on Mounjaro .   At visit 11/11/23 due to relative hypotension after weight loss chlorthalidone  reduced to 12.5mg  daily.   She presents today for follow up. Very pleasant lady who works for [employer] for [employer]. Pending shoulder scope with Dr. Dozier upcoming 11/10/24. Feels her weight is at a standstill. She is hoping to adjust her exercise regimen after recovering from shoulder scope. Weight down 27 lbs over the last year. Reports no shortness of breath nor dyspnea on exertion. Reports no chest pain, pressure, or tightness. No edema, orthopnea, PND. Reports no palpitations.     Previous antihypertensives: Amlodipine -swelling Hydralazine  - switched to Carvedilol  due to tachycardia  ROS: Please see the history of present illness.    All other systems reviewed and are negative.   Studies Reviewed: SABRA   EKG Interpretation Date/Time:  Friday November 06 2024 10:40:18 EST Ventricular Rate:  83 PR Interval:  172 QRS Duration:  74 QT Interval:  372 QTC Calculation: 437 R Axis:   17  Text Interpretation: Normal sinus rhythm Normal ECG  No acute ST/T wave changes Confirmed by Vannie Reche ([id number]) on 11/06/2024 10:45:19 AM    Cardiac Studies & Procedures   ______________________________________________________________________________________________   STRESS TESTS  MYOCARDIAL PERFUSION IMAGING 09/17/2018  Interpretation Summary  Nuclear stress EF: 55%.  There was no ST segment deviation noted during stress.  The study is normal.  This is a low risk study.  The left ventricular ejection fraction is normal (55-65%).  Normal perfusion EF 55% Baseline ECG with inferolateral T wave changes deemed non diagnostic during stress   ECHOCARDIOGRAM  ECHOCARDIOGRAM COMPLETE 08/16/2021  Narrative ECHOCARDIOGRAM REPORT    Patient Name:   Vickie Lane [facility] Date of Exam: 08/16/2021 Medical Rec #:  [id number]  Height:       66.0 in Accession #:    [id number]                  Weight:       214.6 lb Date of Birth:  16-Mar-1961                    BSA:          2.061 m Patient Age:    60 years                    BP:           186/120  mmHg Patient Gender: F                           HR:           61 bpm. Exam Location:  Outpatient  Procedure: 2D Echo, Cardiac Doppler and Color Doppler  Indications:    R06.9 DOE; R06.02 SOB  History:        Patient has prior history of Echocardiogram examinations, most recent 09/17/2018. Mitral Valve Prolapse, Signs/Symptoms:Dyspnea and Murmur; Risk Factors:Non-Smoker, Dyslipidemia, Hypertension and Diabetes.  Sonographer:    Tillman Nora RVT Sonographer#2:  Orvil Holmes RDCS Referring Phys: [id number] Samarah Hogle S Demauri Advincula  IMPRESSIONS   1. Left ventricular ejection fraction, by estimation, is 55 to 60%. The left ventricle has normal function. The left ventricle has no regional wall motion abnormalities. Left ventricular diastolic parameters are indeterminate. 2. Right ventricular systolic function is normal. The right ventricular size is normal. There is normal pulmonary artery systolic pressure. The estimated right ventricular systolic pressure is 32.8 mmHg. 3. The mitral valve is grossly normal. Trivial mitral valve regurgitation. No evidence of mitral stenosis. 4. The aortic valve is tricuspid. Aortic valve regurgitation is not visualized. No aortic stenosis is present. 5. The inferior vena cava is normal in size with greater than 50% respiratory variability, suggesting right atrial pressure of 3 mmHg.  Comparison(s): No significant change from prior study.  FINDINGS Left Ventricle: Left ventricular ejection fraction, by estimation, is 55 to 60%. The left ventricle has normal function. The left ventricle has no regional wall motion abnormalities. The left ventricular internal cavity size was normal in size. There is no left ventricular hypertrophy. Left ventricular diastolic parameters are indeterminate.  Right Ventricle: The right ventricular size is normal. No increase in right ventricular wall thickness. Right ventricular systolic function is normal. There is normal pulmonary  artery systolic pressure. The tricuspid regurgitant velocity is 2.73 m/s, and with an assumed right atrial pressure of 3 mmHg, the estimated right ventricular systolic pressure is 32.8 mmHg.  Left Atrium: Left atrial size was normal in size.  Right Atrium: Right atrial size was normal in size.  Pericardium: Trivial pericardial effusion is present.  Mitral Valve: The mitral valve is grossly normal. Trivial mitral valve regurgitation. No evidence of mitral valve stenosis.  Tricuspid Valve: The tricuspid valve is grossly normal. Tricuspid valve regurgitation is trivial. No evidence of tricuspid stenosis.  Aortic Valve: The aortic valve is tricuspid. Aortic valve regurgitation is not visualized. No aortic stenosis is present.  Pulmonic Valve: The pulmonic valve was grossly normal. Pulmonic valve regurgitation is not visualized. No evidence of pulmonic stenosis.  Aorta: The aortic root and ascending aorta are structurally normal, with no evidence of dilitation.  Venous: The inferior vena cava is normal in size with greater than 50%  respiratory variability, suggesting right atrial pressure of 3 mmHg.  IAS/Shunts: The atrial septum is grossly normal.   LEFT VENTRICLE PLAX 2D LVIDd:         4.15 cm  Diastology LVIDs:         2.77 cm  LV e' medial:    6.85 cm/s LV PW:         1.60 cm  LV E/e' medial:  15.9 LV IVS:        0.81 cm  LV e' lateral:   9.68 cm/s LVOT diam:     1.90 cm  LV E/e' lateral: 11.3 LV SV:         64 LV SV Index:   31 LVOT Area:     2.84 cm   RIGHT VENTRICLE RV Basal diam:  3.07 cm RV Mid diam:    2.47 cm  LEFT ATRIUM             Index       RIGHT ATRIUM           Index LA diam:        3.50 cm 1.70 cm/m  RA Area:     20.50 cm LA Vol (A2C):   95.0 ml 46.10 ml/m RA Volume:   60.50 ml  29.36 ml/m LA Vol (A4C):   56.6 ml 27.47 ml/m LA Biplane Vol: 76.3 ml 37.03 ml/m AORTIC VALVE LVOT Vmax:   98.10 cm/s LVOT Vmean:  62.300 cm/s LVOT VTI:    0.227  m  AORTA Ao Root diam: 2.50 cm Ao Asc diam:  2.90 cm  MITRAL VALVE                TRICUSPID VALVE MV Area (PHT): 3.91 cm     TR Peak grad:   29.8 mmHg MV Decel Time: 194 msec     TR Vmax:        273.00 cm/s MV E velocity: 109.00 cm/s MV A velocity: 79.00 cm/s   SHUNTS MV E/A ratio:  1.38         Systemic VTI:  0.23 m Systemic Diam: 1.90 cm  Darryle Decent MD Electronically signed by Darryle Decent MD Signature Date/Time: 08/16/2021/2:24:00 PM    Final      CT SCANS  CT CORONARY MORPH W/CTA COR W/SCORE 04/04/2016  Addendum 04/04/2016 10:52 AM ADDENDUM REPORT: 04/04/2016 10:49 CLINICAL DATA:  Chest pain EXAM: Cardiac CTA MEDICATIONS: Sub lingual nitro. 4mg  and lopressor  5mg  TECHNIQUE: The patient was scanned on a Philips 256 slice scanner. Gantry rotation speed was 270 msecs. Collimation was .9mm. A 100 kV prospective scan was triggered in the descending thoracic aorta at 111 HU's with 5% padding centered around 78% of the R-R interval. Average HR during the scan was 58 bpm. The 3D data set was interpreted on a dedicated work station using MPR, MIP and VRT modes. A total of 80cc of contrast was used. FINDINGS: Non-cardiac: See separate report from [facility]. No significant findings on limited lung and soft tissue windows. Calcium Score: 34 mild calcium in proximal LAD and proximal and mid RCA Aortic root normal 3.4 cm compared to descending thoracic aorta 2.3 cm Coronary Arteries: Right dominant with no anomalies LM: Normal LAD:  Less than 50% mixed plaque in proximal vessel IM  Normal D2:  Less than 50% mixed plaque in proximal vessel Circumflex: Normal OM1: Normal OM2: Normal RCA:  Less than 30% calcified disease in proximal and mid vessel PDA: Normal PLA:  Normal IMPRESSION:  1) Calcium Score 34 90th percentile for age and sex matched controls 2) Less than 50% calcific non obstructive disease in proximal LAD and D2 with less than 30% calcific  disease in proximal and mid RCA 3) Normal aortic root Maude Emmer Electronically Signed By: Maude Emmer M.D. On: 04/04/2016 10:49  Narrative EXAM: OVER-READ INTERPRETATION  CT CHEST  The following report is an over-read performed by radiologist Dr. Toribio Cove [facility] on 04/04/2016. This over-read does not include interpretation of cardiac or coronary anatomy or pathology. The coronary calcium score/coronary CTA interpretation by the cardiologist is attached.  COMPARISON:  None.  FINDINGS: Within the visualized portions of the thorax there are no suspicious appearing pulmonary nodules or masses, there is no acute consolidative airspace disease, no pleural effusions, no pneumothorax, and no lymphadenopathy. Visualized portions of the upper abdomen are unremarkable. There are no aggressive appearing lytic or blastic lesions noted in the visualized portions of the skeleton.  IMPRESSION: 1. No significant incidental noncardiac findings are noted.  Electronically Signed: By: Toribio Aye M.D. On: 04/04/2016 10:09     ______________________________________________________________________________________________        Risk Assessment/Calculations:            Physical Exam:   VS:  BP (!) 122/92   Pulse 83   Ht 5' 5 (1.651 m)   Wt 184 lb 9.6 oz (83.7 kg)   SpO2 94%   BMI 30.72 kg/m    Wt Readings from Last 3 Encounters:  11/06/24 184 lb 9.6 oz (83.7 kg)  10/09/24 182 lb (82.6 kg)  09/15/24 180 lb 3.2 oz (81.7 kg)    GEN: Well nourished, well developed in no acute distress NECK: No JVD; No carotid bruits CARDIAC: RRR, no murmurs, rubs, gallops RESPIRATORY:  Clear to auscultation without rales, wheezing or rhonchi  ABDOMEN: Soft, non-tender, non-distended EXTREMITIES:  No edema; No deformity   ASSESSMENT AND PLAN: .    Preop - pending shoulder scope. Exercise tolerance >4 METS. Per AHA/ACC guidelines, she is deemed acceptable risk  for the planned procedure without additional cardiovascular testing. Will route to surgical team so they are aware.  Not on OAC nor anti-platelet that would need held.   Nonobstructive CAD / HLD, LDL goal <70 - Stable with no anginal symptoms. No indication for ischemic evaluation.  GDMT includes aspirin , carvedilol , simvastatin . Recommend aiming for 150 minutes of moderate intensity activity per week and following a heart healthy diet.   07/2024 LDL 80.  Notes decreased activity related to shoulder injury.  Hopeful to be able to increase her physical activity after shoulder scope.  Plan to repeat lipid panel in 4 months and if LDL not at goal change simvastatin  to rosuvastatin.  DM2 -congratulated on additional 27 pound weight loss on Ozempic .  Continue Ozempic  2 mg weekly. Refills provided.  HTN - controlled by  home readings.  Reasonably controlled at clinic.  Continue carvedilol  6.25 mg twice daily, chlorthalidone  12.5 mg daily, spironolactone  25 mg daily, valsartan  320 mg daily. Refills provided. Discussed to monitor BP at home at least 2 hours after medications and sitting for 5-10 minutes.        Dispo: follow up in 1 year  Signed, Reche GORMAN Finder, NP   "

## 2024-11-06 NOTE — Patient Instructions (Signed)
 Medication Instructions:  Continue your current medications.   *If you need a refill on your cardiac medications before your next appointment, please call your pharmacy*  Lab Work: Your physician recommends that you return for lab work in 4 months for fasting lipid panel  Your provider has recommended lab work. Please have this collected at [facility] at [place]. The lab is open 8:00 am - 4:30 pm. Please avoid 12:00p - 1:00p for lunch hour. You do not need an appointment. Please go to [address] [place], [place] [address]. This is in the Primary Care office on the 3rd floor, let them know you are there for blood work and they will direct you to the lab.   If you have labs (blood work) drawn today and your tests are completely normal, you will receive your results only by: MyChart Message (if you have MyChart) OR A paper copy in the mail If you have any lab test that is abnormal or we need to change your treatment, we will call you to review the results.  Testing/Procedures: Your EKG today looked great!  Follow-Up: At [facility], you and your health needs are our priority.  As part of our continuing mission to provide you with exceptional heart care, our providers are all part of one team.  This team includes your primary Cardiologist (physician) and Advanced Practice Providers or APPs (Physician Assistants and Nurse Practitioners) who all work together to provide you with the care you need, when you need it.  Your next appointment:   1 year(s)  Provider:   Annabella Scarce, MD, Rosaline Bane, NP, or Reche Finder, NP    We recommend signing up for the patient portal called MyChart.  Sign up information is provided on this After Visit Summary.  MyChart is used to connect with patients for Virtual Visits (Telemedicine).  Patients are able to view lab/test results, encounter notes, upcoming appointments, etc.  Non-urgent messages can be sent to  your provider as well.   To learn more about what you can do with MyChart, go to [email or web address].   Other Instructions  Try [employer] on Youtube

## 2024-11-10 ENCOUNTER — Other Ambulatory Visit (HOSPITAL_COMMUNITY): Payer: Self-pay

## 2024-11-10 ENCOUNTER — Other Ambulatory Visit: Payer: Self-pay

## 2024-11-10 DIAGNOSIS — M7542 Impingement syndrome of left shoulder: Secondary | ICD-10-CM | POA: Diagnosis not present

## 2024-11-10 DIAGNOSIS — M24112 Other articular cartilage disorders, left shoulder: Secondary | ICD-10-CM | POA: Diagnosis not present

## 2024-11-10 DIAGNOSIS — M7582 Other shoulder lesions, left shoulder: Secondary | ICD-10-CM | POA: Diagnosis not present

## 2024-11-10 DIAGNOSIS — M75112 Incomplete rotator cuff tear or rupture of left shoulder, not specified as traumatic: Secondary | ICD-10-CM | POA: Diagnosis not present

## 2024-11-10 DIAGNOSIS — M25812 Other specified joint disorders, left shoulder: Secondary | ICD-10-CM | POA: Diagnosis not present

## 2024-11-10 DIAGNOSIS — M7552 Bursitis of left shoulder: Secondary | ICD-10-CM | POA: Diagnosis not present

## 2024-11-10 DIAGNOSIS — M94212 Chondromalacia, left shoulder: Secondary | ICD-10-CM | POA: Diagnosis not present

## 2024-11-10 MED ORDER — ONDANSETRON HCL 4 MG PO TABS
4.0000 mg | ORAL_TABLET | Freq: Four times a day (QID) | ORAL | 0 refills | Status: AC | PRN
  Filled 2024-11-10: qty 20, 5d supply, fill #0

## 2024-11-10 MED ORDER — OXYCODONE HCL 5 MG PO TABS
5.0000 mg | ORAL_TABLET | ORAL | 0 refills | Status: AC | PRN
  Filled 2024-11-10: qty 30, 5d supply, fill #0

## 2024-11-10 MED ORDER — METHOCARBAMOL 500 MG PO TABS
500.0000 mg | ORAL_TABLET | Freq: Four times a day (QID) | ORAL | 0 refills | Status: AC | PRN
  Filled 2024-11-10: qty 30, 8d supply, fill #0

## 2024-11-13 ENCOUNTER — Encounter: Payer: Self-pay | Admitting: Pharmacist

## 2024-11-13 ENCOUNTER — Other Ambulatory Visit: Payer: Self-pay

## 2024-11-17 ENCOUNTER — Other Ambulatory Visit: Payer: Self-pay

## 2024-11-17 ENCOUNTER — Other Ambulatory Visit (HOSPITAL_COMMUNITY): Payer: Self-pay

## 2024-11-25 ENCOUNTER — Encounter: Payer: Self-pay | Admitting: Pharmacist

## 2024-11-25 ENCOUNTER — Other Ambulatory Visit: Payer: Self-pay

## 2024-11-25 ENCOUNTER — Other Ambulatory Visit (HOSPITAL_COMMUNITY): Payer: Self-pay

## 2024-11-26 ENCOUNTER — Other Ambulatory Visit: Payer: Self-pay

## 2024-12-01 ENCOUNTER — Other Ambulatory Visit (HOSPITAL_COMMUNITY): Payer: Self-pay

## 2024-12-23 ENCOUNTER — Telehealth: Payer: Self-pay

## 2024-12-23 ENCOUNTER — Other Ambulatory Visit (HOSPITAL_BASED_OUTPATIENT_CLINIC_OR_DEPARTMENT_OTHER): Payer: Self-pay

## 2024-12-23 ENCOUNTER — Other Ambulatory Visit (HOSPITAL_COMMUNITY): Payer: Self-pay

## 2024-12-23 ENCOUNTER — Encounter: Payer: Self-pay | Admitting: Obstetrics

## 2024-12-23 ENCOUNTER — Encounter: Payer: Self-pay | Admitting: Hematology and Oncology

## 2024-12-23 ENCOUNTER — Encounter (HOSPITAL_BASED_OUTPATIENT_CLINIC_OR_DEPARTMENT_OTHER): Payer: Self-pay

## 2024-12-23 DIAGNOSIS — A6000 Herpesviral infection of urogenital system, unspecified: Secondary | ICD-10-CM

## 2024-12-23 DIAGNOSIS — E1159 Type 2 diabetes mellitus with other circulatory complications: Secondary | ICD-10-CM

## 2024-12-23 MED ORDER — VALACYCLOVIR HCL 500 MG PO TABS
500.0000 mg | ORAL_TABLET | Freq: Every day | ORAL | 11 refills | Status: AC
  Filled 2024-12-23: qty 90, 90d supply, fill #0

## 2024-12-23 MED ORDER — VENLAFAXINE HCL ER 75 MG PO CP24
75.0000 mg | ORAL_CAPSULE | Freq: Every day | ORAL | 3 refills | Status: AC
  Filled 2024-12-23: qty 90, 90d supply, fill #0

## 2024-12-23 MED ORDER — CARVEDILOL 6.25 MG PO TABS
6.2500 mg | ORAL_TABLET | Freq: Two times a day (BID) | ORAL | 3 refills | Status: AC
  Filled 2024-12-23: qty 180, 90d supply, fill #0

## 2024-12-24 ENCOUNTER — Other Ambulatory Visit (HOSPITAL_COMMUNITY): Payer: Self-pay

## 2024-12-24 ENCOUNTER — Other Ambulatory Visit: Payer: Self-pay

## 2024-12-24 ENCOUNTER — Other Ambulatory Visit (HOSPITAL_BASED_OUTPATIENT_CLINIC_OR_DEPARTMENT_OTHER): Payer: Self-pay

## 2024-12-25 ENCOUNTER — Other Ambulatory Visit: Payer: Self-pay

## 2025-03-02 ENCOUNTER — Inpatient Hospital Stay: Admitting: Hematology and Oncology
